# Patient Record
Sex: Male | Born: 1955 | Race: White | Hispanic: No | Marital: Married | Smoking: Never smoker
Health system: Southern US, Community
[De-identification: ages and names within clinical notes are randomized; demographics above are authoritative.]

## PROBLEM LIST (undated history)

## (undated) DIAGNOSIS — E7800 Pure hypercholesterolemia, unspecified: Secondary | ICD-10-CM

## (undated) DIAGNOSIS — E079 Disorder of thyroid, unspecified: Secondary | ICD-10-CM

## (undated) DIAGNOSIS — C801 Malignant (primary) neoplasm, unspecified: Secondary | ICD-10-CM

## (undated) DIAGNOSIS — I2510 Atherosclerotic heart disease of native coronary artery without angina pectoris: Secondary | ICD-10-CM

## (undated) DIAGNOSIS — Z87442 Personal history of urinary calculi: Secondary | ICD-10-CM

## (undated) DIAGNOSIS — G4730 Sleep apnea, unspecified: Secondary | ICD-10-CM

## (undated) DIAGNOSIS — E039 Hypothyroidism, unspecified: Secondary | ICD-10-CM

## (undated) DIAGNOSIS — N400 Enlarged prostate without lower urinary tract symptoms: Secondary | ICD-10-CM

## (undated) DIAGNOSIS — N289 Disorder of kidney and ureter, unspecified: Secondary | ICD-10-CM

## (undated) DIAGNOSIS — M1990 Unspecified osteoarthritis, unspecified site: Secondary | ICD-10-CM

## (undated) HISTORY — PX: VASECTOMY: SHX75

## (undated) HISTORY — PX: HERNIA REPAIR: SHX51

## (undated) HISTORY — PX: SHOULDER SURGERY: SHX246

## (undated) HISTORY — PX: NECK SURGERY: SHX720

## (undated) HISTORY — PX: ANKLE SURGERY: SHX546

## (undated) HISTORY — PX: KNEE SURGERY: SHX244

---

## 2007-05-24 ENCOUNTER — Emergency Department (HOSPITAL_COMMUNITY): Admission: EM | Admit: 2007-05-24 | Discharge: 2007-05-24 | Payer: Self-pay | Admitting: Emergency Medicine

## 2007-12-16 ENCOUNTER — Emergency Department (HOSPITAL_COMMUNITY): Admission: EM | Admit: 2007-12-16 | Discharge: 2007-12-16 | Payer: Self-pay | Admitting: Emergency Medicine

## 2008-08-16 ENCOUNTER — Emergency Department (HOSPITAL_COMMUNITY): Admission: EM | Admit: 2008-08-16 | Discharge: 2008-08-16 | Payer: Self-pay | Admitting: Internal Medicine

## 2008-08-16 ENCOUNTER — Emergency Department (HOSPITAL_COMMUNITY): Admission: EM | Admit: 2008-08-16 | Discharge: 2008-08-16 | Payer: Self-pay | Admitting: Emergency Medicine

## 2010-07-23 LAB — CBC
HCT: 45.4 % (ref 39.0–52.0)
Hemoglobin: 15.9 g/dL (ref 13.0–17.0)
MCHC: 35 g/dL (ref 30.0–36.0)
MCV: 90.3 fL (ref 78.0–100.0)
RBC: 5.02 MIL/uL (ref 4.22–5.81)
RDW: 13.1 % (ref 11.5–15.5)

## 2010-07-23 LAB — BASIC METABOLIC PANEL
CO2: 22 mEq/L (ref 19–32)
Calcium: 9 mg/dL (ref 8.4–10.5)
Chloride: 106 mEq/L (ref 96–112)
GFR calc Af Amer: 52 mL/min — ABNORMAL LOW (ref >60)
Glucose, Bld: 136 mg/dL — ABNORMAL HIGH (ref 70–99)
Potassium: 3.7 mEq/L (ref 3.5–5.1)
Sodium: 136 mEq/L (ref 135–145)

## 2010-07-23 LAB — URINALYSIS, ROUTINE W REFLEX MICROSCOPIC
Specific Gravity, Urine: >1.03 — ABNORMAL HIGH (ref 1.005–1.030)
Specific Gravity, Urine: >1.03 — ABNORMAL HIGH (ref 1.005–1.030)
Urobilinogen, UA: 0.2 mg/dL (ref 0.0–1.0)
Urobilinogen, UA: 0.2 mg/dL (ref 0.0–1.0)

## 2010-07-23 LAB — DIFFERENTIAL
Basophils Relative: 0 % (ref 0–1)
Eosinophils Relative: 0 % (ref 0–5)
Monocytes Absolute: 0.7 10*3/uL (ref 0.1–1.0)
Monocytes Relative: 5 % (ref 3–12)
Neutro Abs: 12.7 10*3/uL — ABNORMAL HIGH (ref 1.7–7.7)

## 2010-07-23 LAB — URINE MICROSCOPIC-ADD ON

## 2010-07-23 LAB — PSA: PSA: 1.21 ng/mL (ref 0.10–4.00)

## 2011-01-15 LAB — DIFFERENTIAL
Lymphocytes Relative: 14
Lymphs Abs: 1.6
Neutro Abs: 8.8 — ABNORMAL HIGH
Neutrophils Relative %: 79 — ABNORMAL HIGH

## 2011-01-15 LAB — URINALYSIS, ROUTINE W REFLEX MICROSCOPIC
Ketones, ur: NEGATIVE
Nitrite: NEGATIVE
Protein, ur: NEGATIVE
pH: 6

## 2011-01-15 LAB — CBC
Platelets: 300
WBC: 11.1 — ABNORMAL HIGH

## 2011-01-15 LAB — BASIC METABOLIC PANEL
BUN: 13
Creatinine, Ser: 1.15
GFR calc non Af Amer: >60

## 2011-01-15 LAB — POCT CARDIAC MARKERS
Myoglobin, poc: 129
Troponin i, poc: <0.05

## 2011-01-15 LAB — SEDIMENTATION RATE: Sed Rate: 5

## 2015-11-03 ENCOUNTER — Emergency Department (HOSPITAL_COMMUNITY)
Admission: EM | Admit: 2015-11-03 | Discharge: 2015-11-03 | Disposition: A | Discharge status: Home / Self Care | Attending: Emergency Medicine | Admitting: Emergency Medicine

## 2015-11-03 ENCOUNTER — Encounter (HOSPITAL_COMMUNITY): Payer: Self-pay

## 2015-11-03 DIAGNOSIS — Z7982 Long term (current) use of aspirin: Secondary | ICD-10-CM | POA: Diagnosis not present

## 2015-11-03 DIAGNOSIS — N200 Calculus of kidney: Secondary | ICD-10-CM

## 2015-11-03 DIAGNOSIS — Z79899 Other long term (current) drug therapy: Secondary | ICD-10-CM | POA: Insufficient documentation

## 2015-11-03 DIAGNOSIS — R319 Hematuria, unspecified: Secondary | ICD-10-CM

## 2015-11-03 DIAGNOSIS — R109 Unspecified abdominal pain: Secondary | ICD-10-CM

## 2015-11-03 HISTORY — DX: Benign prostatic hyperplasia without lower urinary tract symptoms: N40.0

## 2015-11-03 HISTORY — DX: Disorder of thyroid, unspecified: E07.9

## 2015-11-03 HISTORY — DX: Disorder of kidney and ureter, unspecified: N28.9

## 2015-11-03 HISTORY — DX: Pure hypercholesterolemia, unspecified: E78.00

## 2015-11-03 LAB — CBC WITH DIFFERENTIAL/PLATELET
BASOS PCT: 1 %
Basophils Absolute: 0.1 10*3/uL (ref 0.0–0.1)
EOS ABS: 0.3 10*3/uL (ref 0.0–0.7)
EOS PCT: 3 %
HCT: 48.6 % (ref 39.0–52.0)
Hemoglobin: 16.9 g/dL (ref 13.0–17.0)
LYMPHS ABS: 1.5 10*3/uL (ref 0.7–4.0)
Lymphocytes Relative: 16 %
MCH: 31.2 pg (ref 26.0–34.0)
MCHC: 34.8 g/dL (ref 30.0–36.0)
MCV: 89.7 fL (ref 78.0–100.0)
Monocytes Absolute: 0.5 10*3/uL (ref 0.1–1.0)
Monocytes Relative: 5 %
Neutro Abs: 7.5 10*3/uL (ref 1.7–7.7)
Neutrophils Relative %: 75 %
PLATELETS: 309 10*3/uL (ref 150–400)
RBC: 5.42 MIL/uL (ref 4.22–5.81)
RDW: 12.7 % (ref 11.5–15.5)
WBC: 9.9 10*3/uL (ref 4.0–10.5)

## 2015-11-03 LAB — URINE MICROSCOPIC-ADD ON
SQUAMOUS EPITHELIAL / LPF: NONE SEEN
WBC, UA: NONE SEEN WBC/hpf (ref 0–5)

## 2015-11-03 LAB — URINALYSIS, ROUTINE W REFLEX MICROSCOPIC
BILIRUBIN URINE: NEGATIVE
Glucose, UA: NEGATIVE mg/dL
KETONES UR: NEGATIVE mg/dL
Leukocytes, UA: NEGATIVE
Nitrite: NEGATIVE
PROTEIN: NEGATIVE mg/dL
Specific Gravity, Urine: 1.01 (ref 1.005–1.030)
pH: 7.5 (ref 5.0–8.0)

## 2015-11-03 LAB — BASIC METABOLIC PANEL
Anion gap: 9 (ref 5–15)
BUN: 14 mg/dL (ref 6–20)
CO2: 27 mmol/L (ref 22–32)
CREATININE: 1.26 mg/dL — AB (ref 0.61–1.24)
Calcium: 9.7 mg/dL (ref 8.9–10.3)
Chloride: 102 mmol/L (ref 101–111)
Glucose, Bld: 112 mg/dL — ABNORMAL HIGH (ref 65–99)
POTASSIUM: 4.4 mmol/L (ref 3.5–5.1)
SODIUM: 138 mmol/L (ref 135–145)

## 2015-11-03 MED ORDER — ONDANSETRON HCL 4 MG PO TABS: 4.0000 mg | ORAL_TABLET | Freq: Three times a day (TID) | ORAL | Status: DC | PRN

## 2015-11-03 MED ORDER — HYDROCODONE-ACETAMINOPHEN 5-325 MG PO TABS: 1.0000 | ORAL_TABLET | ORAL | Status: DC | PRN

## 2015-11-03 MED ORDER — SODIUM CHLORIDE 0.9 % IV BOLUS (SEPSIS)
1000.0000 mL | Freq: Once | INTRAVENOUS | Status: AC
  Administered 2015-11-03: 1000 mL via INTRAVENOUS

## 2015-11-03 MED ORDER — ONDANSETRON HCL 4 MG/2ML IJ SOLN
4.0000 mg | Freq: Once | INTRAMUSCULAR | Status: AC
  Administered 2015-11-03: 4 mg via INTRAVENOUS
  Filled 2015-11-03: qty 2

## 2015-11-03 MED ORDER — MORPHINE SULFATE (PF) 4 MG/ML IV SOLN
4.0000 mg | Freq: Once | INTRAVENOUS | Status: AC
  Administered 2015-11-03: 4 mg via INTRAVENOUS
  Filled 2015-11-03: qty 1

## 2015-11-03 NOTE — ED Provider Notes (Signed)
CSN: [id number]     Arrival date & time 11/03/15  1024 History  By signing my name below, I, Higinio Plan, attest that this documentation has been prepared under the direction and in the presence of Forde Dandy, MD . Electronically Signed: Higinio Plan, Scribe. 11/03/2015. 11:46 AM.   Chief Complaint  Patient presents with  . Flank Pain  . Hematuria   The history is provided by the patient. No language interpreter was used.   HPI Comments: Adrian Marshall is a 60 y.o. male with PMHx of kidney stones, who presents to the Emergency Department complaining of gradually worsening, intermittent, hematuria and left flank pain that began at ~8pm last night. He notes his pain is worsened when sitting but is improved while being active and walking around. Pt reports a hx of 3 kidney stones in the past 7 years. He states his past 2 kidney stones were 67mm and took 5 months to pass. He states every experience with his kidney stones has been different; but states he had similar symptoms with prior stone. Pt states he has used the strainer that he received when passing his first stone. Pt denies dysuria, vomiting, diarrhea, and fever. He notes he was tested for bladder cancer with cystoscopy 14 months ago which was negative. He states he is currently followed by a nephrologist and urologist at the [facility]. He reports tramadol doesn't relieve his pain; however, morphine does.   Past Medical History  Diagnosis Date  . Renal disorder     kidney stones  . BPH (benign prostatic hyperplasia)   . Thyroid disease   . Hypercholesterolemia    Past Surgical History  Procedure Laterality Date  . Neck surgery    . Hernia repair    . Shoulder surgery    . Knee surgery    . Ankle surgery     History reviewed. No pertinent family history. Social History  Substance Use Topics  . Smoking status: Never Smoker   . Smokeless tobacco: None  . Alcohol Use: No    Review of Systems 10/14 systems reviewed and all  are negative for acute change except as noted in the HPI.  Allergies  Neurontin and Other  Home Medications   Prior to Admission medications   Not on File   BP 153/90 mmHg  Pulse 101  Temp(Src) 97.3 F (36.3 C) (Oral)  Resp 20  Ht 5\' 10"  (1.778 m)  Wt 200 lb (90.719 kg)  BMI 28.70 kg/m2  SpO2 96% Physical Exam Physical Exam  Nursing note and vitals reviewed. Constitutional: Well developed, well nourished, non-toxic, and in no acute distress Head: Normocephalic and atraumatic.  Mouth/Throat: Oropharynx is clear and moist.  Neck: Normal range of motion. Neck supple.  Cardiovascular: Normal rate and regular rhythm.   Pulmonary/Chest: Effort normal and breath sounds normal.  Abdominal: Soft. There is no tenderness. There is no rebound and no guarding. minimal CVA tenderness on his left side  Musculoskeletal: Normal range of motion.  Neurological: Alert, no facial droop, fluent speech, moves all extremities symmetrically Skin: Skin is warm and dry.  Psychiatric: Cooperative  ED Course  Procedures  DIAGNOSTIC STUDIES:  Oxygen Saturation is 96% on RA, normal by my interpretation.    COORDINATION OF CARE:  11:42 AM Discussed treatment plan, which includes IV fluids, urinalysis and morphine with pt at bedside and pt agreed to plan.  Labs Review Labs Reviewed  CBC WITH DIFFERENTIAL/PLATELET  URINALYSIS, ROUTINE W REFLEX MICROSCOPIC (NOT AT [facility])  BASIC METABOLIC PANEL    Imaging Review No results found. I have personally reviewed and evaluated these images and lab results as part of my medical decision-making.   EKG Interpretation None      EMERGENCY DEPARTMENT US RENAL EXAM  "Study: Limited Retroperitoneal Ultrasound of Kidneys"  INDICATIONS: Flank pain  Long and short axis of both kidneys were obtained.   PERFORMED BY: Myself  IMAGES ARCHIVED?: Yes  LIMITATIONS: None  VIEWS USED: Long axis and Short axis   INTERPRETATION: Left  Hydronephrosis mild to  none   CPT Code: [id number] (limited retroperitoneal)    MDM   Final diagnoses:  None    60 year old male who presents with left flank pain and hematuria for 1 day. History of nephrolithiasis, and this seems similar. He is afebrile and hemodynamically stable. with some left flank tenderness but abdomen is otherwise benign. He has stable renal function and urine is without infection but with blood. Bedside renal US with ? Of mild hydronephrosis. Given that the symptoms are similar to prior episodes of kidney stone patient will continue supportive care management for this at home. As I'm not concerned about other intra-abdominal process or retroperitoneal process such as aneurysm or infection, I do not think CT would change management. I have discussed strict return and follow-up instructions. Discharged with NOrco and antiemetics. He will f/u with nephrologist and urologist as outpatient.   I personally performed the services described in this documentation, which was scribed in my presence. The recorded information has been reviewed and is accurate.    Forde Dandy, MD 11/03/15 1259

## 2015-11-03 NOTE — ED Notes (Signed)
PT reports notice hematuria off and on since 8pm last night and left flank pain.  Reports history of kidney stones.

## 2015-11-03 NOTE — Discharge Instructions (Signed)
Continue to follow-up with your urologist and nephrologist for ongoing management. Return for worsening symptoms, including worsening pain, intractable vomiting, fever, passing out, or any other symptoms concerning to you.    Kidney Stones Kidney stones (urolithiasis) are deposits that form inside your kidneys. The intense pain is caused by the stone moving through the urinary tract. When the stone moves, the ureter goes into spasm around the stone. The stone is usually passed in the urine.  CAUSES   A disorder that makes certain neck glands produce too much parathyroid hormone (primary hyperparathyroidism).  A buildup of uric acid crystals, similar to gout in your joints.  Narrowing (stricture) of the ureter.  A kidney obstruction present at birth (congenital obstruction).  Previous surgery on the kidney or ureters.  Numerous kidney infections. SYMPTOMS   Feeling sick to your stomach (nauseous).  Throwing up (vomiting).  Blood in the urine (hematuria).  Pain that usually spreads (radiates) to the groin.  Frequency or urgency of urination. DIAGNOSIS   Taking a history and physical exam.  Blood or urine tests.  CT scan.  Occasionally, an examination of the inside of the urinary bladder (cystoscopy) is performed. TREATMENT   Observation.  Increasing your fluid intake.  Extracorporeal shock wave lithotripsy--This is a noninvasive procedure that uses shock waves to break up kidney stones.  Surgery may be needed if you have severe pain or persistent obstruction. There are various surgical procedures. Most of the procedures are performed with the use of small instruments. Only small incisions are needed to accommodate these instruments, so recovery time is minimized. The size, location, and chemical composition are all important variables that will determine the proper choice of action for you. Talk to your health care provider to better understand your situation so that you  will minimize the risk of injury to yourself and your kidney.  HOME CARE INSTRUCTIONS   Drink enough water and fluids to keep your urine clear or pale yellow. This will help you to pass the stone or stone fragments.  Strain all urine through the provided strainer. Keep all particulate matter and stones for your health care provider to see. The stone causing the pain may be as small as a grain of salt. It is very important to use the strainer each and every time you pass your urine. The collection of your stone will allow your health care provider to analyze it and verify that a stone has actually passed. The stone analysis will often identify what you can do to reduce the incidence of recurrences.  Only take over-the-counter or prescription medicines for pain, discomfort, or fever as directed by your health care provider.  Keep all follow-up visits as told by your health care provider. This is important.  Get follow-up X-rays if required. The absence of pain does not always mean that the stone has passed. It may have only stopped moving. If the urine remains completely obstructed, it can cause loss of kidney function or even complete destruction of the kidney. It is your responsibility to make sure X-rays and follow-ups are completed. Ultrasounds of the kidney can show blockages and the status of the kidney. Ultrasounds are not associated with any radiation and can be performed easily in a matter of minutes.  Make changes to your daily diet as told by your health care provider. You may be told to:  Limit the amount of salt that you eat.  Eat 5 or more servings of fruits and vegetables each day.  Limit  the amount of meat, poultry, fish, and eggs that you eat.  Collect a 24-hour urine sample as told by your health care provider.You may need to collect another urine sample every 6-12 months. SEEK MEDICAL CARE IF:  You experience pain that is progressive and unresponsive to any pain medicine you  have been prescribed. SEEK IMMEDIATE MEDICAL CARE IF:   Pain cannot be controlled with the prescribed medicine.  You have a fever or shaking chills.  The severity or intensity of pain increases over 18 hours and is not relieved by pain medicine.  You develop a new onset of abdominal pain.  You feel faint or pass out.  You are unable to urinate.   This information is not intended to replace advice given to you by your health care provider. Make sure you discuss any questions you have with your health care provider.   Document Released: 03/31/2005 Document Revised: 12/20/2014 Document Reviewed: 09/01/2012 Elsevier Interactive Patient Education [employer].

## 2017-06-16 ENCOUNTER — Ambulatory Visit (INDEPENDENT_AMBULATORY_CARE_PROVIDER_SITE_OTHER): Admitting: Urology

## 2017-06-16 ENCOUNTER — Encounter: Payer: Self-pay | Admitting: Urology

## 2017-06-16 VITALS — BP 138/79 | HR 105 | Ht 70.0 in | Wt 200.5 lb

## 2017-06-16 DIAGNOSIS — N200 Calculus of kidney: Secondary | ICD-10-CM

## 2017-06-16 DIAGNOSIS — N401 Enlarged prostate with lower urinary tract symptoms: Secondary | ICD-10-CM | POA: Diagnosis not present

## 2017-06-16 DIAGNOSIS — R3914 Feeling of incomplete bladder emptying: Secondary | ICD-10-CM

## 2017-06-16 LAB — MICROSCOPIC EXAMINATION
BACTERIA UA: NONE SEEN
Epithelial Cells (non renal): NONE SEEN /hpf (ref 0–10)
RBC MICROSCOPIC, UA: NONE SEEN /HPF (ref 0–?)

## 2017-06-16 LAB — URINALYSIS, COMPLETE
Bilirubin, UA: NEGATIVE
GLUCOSE, UA: NEGATIVE
KETONES UA: NEGATIVE
LEUKOCYTES UA: NEGATIVE
NITRITE UA: NEGATIVE
PROTEIN UA: NEGATIVE
RBC, UA: NEGATIVE
Urobilinogen, Ur: 1 mg/dL (ref 0.2–1.0)
pH, UA: 5.5 (ref 5.0–7.5)

## 2017-06-17 ENCOUNTER — Encounter: Payer: Self-pay | Admitting: Urology

## 2017-06-17 DIAGNOSIS — R3914 Feeling of incomplete bladder emptying: Principal | ICD-10-CM

## 2017-06-17 DIAGNOSIS — N200 Calculus of kidney: Secondary | ICD-10-CM | POA: Insufficient documentation

## 2017-06-17 DIAGNOSIS — N401 Enlarged prostate with lower urinary tract symptoms: Secondary | ICD-10-CM | POA: Insufficient documentation

## 2017-06-17 NOTE — Progress Notes (Signed)
06/16/2017 7:03 AM   Adrian Marshall 15-Oct-1955 [id number]  Referring provider: Celedonio Savage, MD [place], [place] [address]  Chief Complaint  Patient presents with  . Benign Prostatic Hypertrophy    HPI: Adrian Marshall is a 62 year old male self-referred for a recent bump in his PSA.  He has a long history of BPH and has been followed by the [place] since 2005.  His PSA in early 2000 was in the 1 range.  It has increased slightly over the years but not abnormally.  PSA in 2015 was in the upper 1 range.  Since July 2016 his PSA has been in the 2 range.  It was 2.5 in January 2018 and his most recent PSA February 2019 had bumped slightly to 3.46.  He was concerned about this increase.  He does have severe lower urinary tract symptoms consisting of sensation of incomplete emptying, frequency, intermittency, urgency, weak urinary stream and straining to urinate.  He has nocturia x2-3.  IPSS was 25/26 with a qol rated 3/6.  He denies dysuria or gross hematuria.  He denies flank, abdominal, pelvic or scrotal pain.  He is presently on tamsulosin 0.8 mg daily.  He has tried finasteride in the past however could not tolerate secondary to side effects.  He is also followed for a 9 mm left lower pole calculus.  His prostate volume has been estimated at 46 cc on transabdominal ultrasound.   PMH: Past Medical History:  Diagnosis Date  . BPH (benign prostatic hyperplasia)   . Hypercholesterolemia   . Renal disorder    kidney stones  . Thyroid disease     Surgical History: Past Surgical History:  Procedure Laterality Date  . ANKLE SURGERY    . HERNIA REPAIR    . KNEE SURGERY    . NECK SURGERY    . SHOULDER SURGERY      Home Medications:  Allergies as of 06/16/2017      Reactions   Cortisone    Other reaction(s): Other (See Comments) Other Reaction: inj to shoulder - paralysis   Neurontin [gabapentin]    Other    steroids      Medication List        Accurate as  of 06/16/17 11:59 PM. Always use your most recent med list.          aspirin EC 81 MG tablet Take 81 mg by mouth daily.   atorvastatin 80 MG tablet Commonly known as:  LIPITOR Take 80 mg by mouth daily.   levothyroxine 200 MCG tablet Commonly known as:  SYNTHROID, LEVOTHROID Take 200 mcg by mouth daily before breakfast.   tamsulosin 0.4 MG Caps capsule Commonly known as:  FLOMAX Take 0.8 mg by mouth daily.       Allergies:  Allergies  Allergen Reactions  . Cortisone     Other reaction(s): Other (See Comments) Other Reaction: inj to shoulder - paralysis  . Neurontin [Gabapentin]   . Other     steroids    Family History: Family History  Problem Relation Age of Onset  . Prostate cancer Neg Hx   . Bladder Cancer Neg Hx   . Kidney cancer Neg Hx     Social History:  reports that  has never smoked. he has never used smokeless tobacco. He reports that he does not drink alcohol or use drugs.  ROS: UROLOGY Frequent Urination?: Yes Hard to postpone urination?: Yes Burning/pain with urination?: No Get up at night to  urinate?: Yes Leakage of urine?: No Urine stream starts and stops?: Yes Trouble starting stream?: Yes Do you have to strain to urinate?: No Blood in urine?: No Urinary tract infection?: No Sexually transmitted disease?: No Injury to kidneys or bladder?: No Painful intercourse?: No Weak stream?: Yes Erection problems?: No Penile pain?: No  Gastrointestinal Nausea?: No Vomiting?: No Indigestion/heartburn?: No Diarrhea?: No Constipation?: No  Constitutional Fever: No Night sweats?: No Weight loss?: No Fatigue?: No  Skin Skin rash/lesions?: No Itching?: No  Eyes Blurred vision?: No Double vision?: No  Ears/Nose/Throat Sore throat?: No Sinus problems?: No  Hematologic/Lymphatic Swollen glands?: No Easy bruising?: No  Cardiovascular Leg swelling?: No Chest pain?: No  Respiratory Cough?: No Shortness of breath?:  No  Endocrine Excessive thirst?: No  Musculoskeletal Back pain?: No Joint pain?: Yes  Neurological Headaches?: No Dizziness?: No  Psychologic Depression?: No Anxiety?: No  Physical Exam: BP 138/79 (BP Location: Right Arm, Patient Position: Sitting, Cuff Size: Normal)   Pulse (!) 105   Ht 5\' 10"  (1.778 m)   Wt 200 lb 8 oz (90.9 kg)   BMI 28.77 kg/m   Constitutional:  Alert and oriented, No acute distress. HEENT: [place] AT, moist mucus membranes.  Trachea midline, no masses. Cardiovascular: No clubbing, cyanosis, or edema. Respiratory: Normal respiratory effort, no increased work of breathing. GI: Abdomen is soft, nontender, nondistended, no abdominal masses GU: No CVA tenderness.  Prostate 50 g, smooth without nodules. Lymph: No cervical or inguinal lymphadenopathy. Skin: No rashes, bruises or suspicious lesions. Neurologic: Grossly intact, no focal deficits, moving all 4 extremities. Psychiatric: Normal mood and affect.  Laboratory Data: Lab Results  Component Value Date   WBC 9.9 11/03/2015   HGB 16.9 11/03/2015   HCT 48.6 11/03/2015   MCV 89.7 11/03/2015   PLT 309 11/03/2015    Lab Results  Component Value Date   CREATININE 1.26 (H) 11/03/2015    Urinalysis Dipstick/microscopy negative  Pertinent Imaging: N/A  Assessment & Plan:    1. Benign prostatic hyperplasia with incomplete bladder emptying PVR by bladder scan today was 195 mL.  His most recent PSA has bumped slightly greater than 0.75 however he was informed that PSA velocity is determined on 3 successive readings.  Would recommend a repeat PSA in approximately 3-4 months to make sure he is not on an upward trend.  His DRE is benign.  He does have incomplete bladder emptying and severe lower urinary tract symptoms.  He is on tamsulosin 0.8 mg and was unable to tolerate 5-ARI medication.  I discussed surgical options including TURP, PVP and minimally invasive treatment options.  He was interested in  pursuing and will schedule cystoscopy.  - Urinalysis, Complete - Bladder Scan (Post Void Residual) in office  2. Nephrolithiasis Asymptomatic, nonobstructing left lower pole calculus followed regularly at the [place].    Abbie Sons, [place] [address], [place], [place] [address] [phone]

## 2017-06-29 ENCOUNTER — Other Ambulatory Visit

## 2017-06-29 DIAGNOSIS — R3914 Feeling of incomplete bladder emptying: Principal | ICD-10-CM

## 2017-06-29 DIAGNOSIS — N401 Enlarged prostate with lower urinary tract symptoms: Secondary | ICD-10-CM

## 2017-06-30 LAB — PSA: Prostate Specific Ag, Serum: 3 ng/mL (ref 0.0–4.0)

## 2017-07-01 ENCOUNTER — Encounter: Payer: Self-pay | Admitting: Urology

## 2017-07-01 ENCOUNTER — Other Ambulatory Visit: Payer: Self-pay | Admitting: Radiology

## 2017-07-01 ENCOUNTER — Telehealth: Payer: Self-pay

## 2017-07-01 ENCOUNTER — Ambulatory Visit (INDEPENDENT_AMBULATORY_CARE_PROVIDER_SITE_OTHER): Admitting: Urology

## 2017-07-01 ENCOUNTER — Other Ambulatory Visit: Payer: Self-pay

## 2017-07-01 VITALS — BP 149/73 | HR 108 | Ht 70.0 in | Wt 195.0 lb

## 2017-07-01 DIAGNOSIS — N401 Enlarged prostate with lower urinary tract symptoms: Secondary | ICD-10-CM

## 2017-07-01 DIAGNOSIS — R3914 Feeling of incomplete bladder emptying: Secondary | ICD-10-CM

## 2017-07-01 DIAGNOSIS — N200 Calculus of kidney: Secondary | ICD-10-CM

## 2017-07-01 LAB — URINALYSIS, COMPLETE
Bilirubin, UA: NEGATIVE
GLUCOSE, UA: NEGATIVE
KETONES UA: NEGATIVE
LEUKOCYTES UA: NEGATIVE
Nitrite, UA: NEGATIVE
Urobilinogen, Ur: 0.2 mg/dL (ref 0.2–1.0)
pH, UA: 5.5 (ref 5.0–7.5)

## 2017-07-01 LAB — MICROSCOPIC EXAMINATION
EPITHELIAL CELLS (NON RENAL): NONE SEEN /HPF (ref 0–10)
WBC, UA: NONE SEEN /hpf (ref 0–?)

## 2017-07-01 MED ORDER — LIDOCAINE HCL 2 % EX GEL
1.0000 "application " | Freq: Once | CUTANEOUS | Status: AC
  Administered 2017-07-01: 1 via URETHRAL

## 2017-07-01 MED ORDER — CIPROFLOXACIN HCL 500 MG PO TABS
500.0000 mg | ORAL_TABLET | Freq: Once | ORAL | Status: AC
  Administered 2017-07-01: 500 mg via ORAL

## 2017-07-01 NOTE — Progress Notes (Signed)
   07/01/17  CC:  Chief Complaint  Patient presents with  . Cysto    HPI: Refer to my previous note dated 06/16/2017.  Repeat PSA was 3.0  Blood pressure (!) 149/73, pulse (!) 108, height 5\' 10"  (1.778 m), weight 195 lb (88.5 kg). NED. A&Ox3.   No respiratory distress   Abd soft, NT, ND Normal phallus with bilateral descended testicles  Cystoscopy Procedure Note  Patient identification was confirmed, informed consent was obtained, and patient was prepped using Betadine solution.  Lidocaine jelly was administered per urethral meatus.    Preoperative abx where received prior to procedure.     Pre-Procedure: - Inspection reveals a normal caliber ureteral meatus.  Procedure: The flexible cystoscope was introduced without difficulty - No urethral strictures/lesions are present. - Coapting lateral lobes with a 4 cm prostatic urethral length.  No significant bladder neck elevation or median lobe  - Normal bladder neck - Bilateral ureteral orifices identified - Bladder mucosa  reveals no ulcers, tumors, or lesions - No bladder stones -Mild trabeculation  Retroflexion shows no intravesical median lobe   Post-Procedure: - Patient tolerated the procedure well  Assessment/ Plan: 62 year old male with bothersome lower urinary tract symptoms on combined medical therapy.  He is not satisfied with his voiding pattern.  PVR last visit was 195 mL.  We discussed surgical options including minimally invasive options of your left and water vapor ablation.  TURP and PVP were also discussed.  He would like to proceed with TURP.

## 2017-07-01 NOTE — H&P (View-Only) (Signed)
07/01/2017 4:45 PM   Adrian Marshall [facility] [birth year] [id number]  Referring provider: No referring provider defined for this encounter.   HPI: 62 year old male with a long history of BPH and lower urinary tract symptoms.  PVR was 195 mL.  He is on  medical therapy with tamsulosin 0.8 mg daily.  He has been on finasteride in the past however could not tolerate secondary to side effects.  Cystoscopy performed today remarkable for lateral lobe enlargement.  He has elected to proceed with TURP.  PSA was stable at 3.0.   PMH: Past Medical History:  Diagnosis Date  . BPH (benign prostatic hyperplasia)   . Hypercholesterolemia   . Renal disorder    kidney stones  . Thyroid disease     Surgical History: Past Surgical History:  Procedure Laterality Date  . ANKLE SURGERY    . HERNIA REPAIR    . KNEE SURGERY    . NECK SURGERY    . SHOULDER SURGERY    . VASECTOMY      Home Medications:  Allergies as of 07/01/2017      Reactions   Cortisone    Other reaction(s): Other (See Comments) Other Reaction: inj to shoulder - paralysis   Neurontin [gabapentin]    Other    steroids      Medication List        Accurate as of 07/01/17  4:45 PM. Always use your most recent med list.          aspirin EC 81 MG tablet Take 81 mg by mouth daily.   atorvastatin 80 MG tablet Commonly known as:  LIPITOR Take 80 mg by mouth daily.   levothyroxine 200 MCG tablet Commonly known as:  SYNTHROID, LEVOTHROID Take 200 mcg by mouth daily before breakfast.   tamsulosin 0.4 MG Caps capsule Commonly known as:  FLOMAX Take 0.8 mg by mouth daily.       Allergies:  Allergies  Allergen Reactions  . Cortisone     Other reaction(s): Other (See Comments) Other Reaction: inj to shoulder - paralysis  . Neurontin [Gabapentin]   . Other     steroids    Family History: Family History  Problem Relation Age of Onset  . Prostate cancer Neg Hx   . Bladder Cancer Neg Hx   . Kidney cancer Neg  Hx     Social History:  reports that  has never smoked. he has never used smokeless tobacco. He reports that he does not drink alcohol or use drugs.  ROS: UROLOGY Frequent Urination?: Yes Hard to postpone urination?: Yes Burning/pain with urination?: No Get up at night to urinate?: Yes Leakage of urine?: No Urine stream starts and stops?: Yes Trouble starting stream?: Yes Do you have to strain to urinate?: No Blood in urine?: No Urinary tract infection?: No Sexually transmitted disease?: No Injury to kidneys or bladder?: No Painful intercourse?: No Weak stream?: Yes Erection problems?: No Penile pain?: No  Gastrointestinal Nausea?: No Vomiting?: No Indigestion/heartburn?: No Diarrhea?: No Constipation?: No  Constitutional Fever: No Night sweats?: No Weight loss?: No Fatigue?: No  Skin Skin rash/lesions?: No Itching?: No  Eyes Blurred vision?: No Double vision?: No  Ears/Nose/Throat Sore throat?: No Sinus problems?: No  Hematologic/Lymphatic Swollen glands?: No Easy bruising?: No  Cardiovascular Leg swelling?: No Chest pain?: No  Respiratory Cough?: No Shortness of breath?: No  Endocrine Excessive thirst?: No  Musculoskeletal Back pain?: No Joint pain?: Yes  Neurological Headaches?: No Dizziness?: No  Psychologic Depression?: No  Anxiety?: No   Physical Exam: BP (!) 149/73 (BP Location: Left Arm, Patient Position: Sitting, Cuff Size: Normal)   Pulse (!) 108   Ht 5\' 10"  (1.778 m)   Wt 195 lb (88.5 kg)   BMI 27.98 kg/m   Constitutional:  Alert and oriented, No acute distress. HEENT: [place] AT, moist mucus membranes.  Trachea midline, no masses. Cardiovascular: No clubbing, cyanosis, or edema. Respiratory: Normal respiratory effort, no increased work of breathing. GI: Abdomen is soft, nontender, nondistended, no abdominal masses GU: No CVA tenderness Lymph: No cervical or inguinal lymphadenopathy. Skin: No rashes, bruises  or suspicious lesions. Neurologic: Grossly intact, no focal deficits, moving all 4 extremities. Psychiatric: Normal mood and affect.  Laboratory Data: Lab Results  Component Value Date   WBC 9.9 11/03/2015   HGB 16.9 11/03/2015   HCT 48.6 11/03/2015   MCV 89.7 11/03/2015   PLT 309 11/03/2015    Lab Results  Component Value Date   CREATININE 1.26 (H) 11/03/2015    Assessment & Plan:   62 year old male with BPH and incomplete bladder emptying.  He has elected to proceed with TURP after discussing management options.  The procedure was discussed in detail including potential risks of bleeding, infection, retrograde ejaculation, urethral stricture, bladder contraction and rarely urinary incontinence/erectile dysfunction.  He was informed that retrograde ejaculation is quite common with this procedure at 70%.  The potential risks of anesthesia were discussed.  He indicated all questions were answered to his satisfaction and desires to proceed.   Abbie Sons, [place] [address], [place], [place] [address] [phone]

## 2017-07-01 NOTE — Telephone Encounter (Signed)
Letter sent.

## 2017-07-01 NOTE — Telephone Encounter (Signed)
-----   Message from Abbie Sons, MD sent at 06/30/2017  9:56 PM EDT ----- Repeat PSA looks much better at 3.0.

## 2017-07-01 NOTE — Progress Notes (Signed)
07/01/2017 4:45 PM   Adrian Marshall [facility] 07-27-1955 [id number]  Referring provider: No referring provider defined for this encounter.   HPI: 62 year old male with a long history of BPH and lower urinary tract symptoms.  PVR was 195 mL.  He is on  medical therapy with tamsulosin 0.8 mg daily.  He has been on finasteride in the past however could not tolerate secondary to side effects.  Cystoscopy performed today remarkable for lateral lobe enlargement.  He has elected to proceed with TURP.  PSA was stable at 3.0.   PMH: Past Medical History:  Diagnosis Date  . BPH (benign prostatic hyperplasia)   . Hypercholesterolemia   . Renal disorder    kidney stones  . Thyroid disease     Surgical History: Past Surgical History:  Procedure Laterality Date  . ANKLE SURGERY    . HERNIA REPAIR    . KNEE SURGERY    . NECK SURGERY    . SHOULDER SURGERY    . VASECTOMY      Home Medications:  Allergies as of 07/01/2017      Reactions   Cortisone    Other reaction(s): Other (See Comments) Other Reaction: inj to shoulder - paralysis   Neurontin [gabapentin]    Other    steroids      Medication List        Accurate as of 07/01/17  4:45 PM. Always use your most recent med list.          aspirin EC 81 MG tablet Take 81 mg by mouth daily.   atorvastatin 80 MG tablet Commonly known as:  LIPITOR Take 80 mg by mouth daily.   levothyroxine 200 MCG tablet Commonly known as:  SYNTHROID, LEVOTHROID Take 200 mcg by mouth daily before breakfast.   tamsulosin 0.4 MG Caps capsule Commonly known as:  FLOMAX Take 0.8 mg by mouth daily.       Allergies:  Allergies  Allergen Reactions  . Cortisone     Other reaction(s): Other (See Comments) Other Reaction: inj to shoulder - paralysis  . Neurontin [Gabapentin]   . Other     steroids    Family History: Family History  Problem Relation Age of Onset  . Prostate cancer Neg Hx   . Bladder Cancer Neg Hx   . Kidney cancer Neg  Hx     Social History:  reports that  has never smoked. he has never used smokeless tobacco. He reports that he does not drink alcohol or use drugs.  ROS: UROLOGY Frequent Urination?: Yes Hard to postpone urination?: Yes Burning/pain with urination?: No Get up at night to urinate?: Yes Leakage of urine?: No Urine stream starts and stops?: Yes Trouble starting stream?: Yes Do you have to strain to urinate?: No Blood in urine?: No Urinary tract infection?: No Sexually transmitted disease?: No Injury to kidneys or bladder?: No Painful intercourse?: No Weak stream?: Yes Erection problems?: No Penile pain?: No  Gastrointestinal Nausea?: No Vomiting?: No Indigestion/heartburn?: No Diarrhea?: No Constipation?: No  Constitutional Fever: No Night sweats?: No Weight loss?: No Fatigue?: No  Skin Skin rash/lesions?: No Itching?: No  Eyes Blurred vision?: No Double vision?: No  Ears/Nose/Throat Sore throat?: No Sinus problems?: No  Hematologic/Lymphatic Swollen glands?: No Easy bruising?: No  Cardiovascular Leg swelling?: No Chest pain?: No  Respiratory Cough?: No Shortness of breath?: No  Endocrine Excessive thirst?: No  Musculoskeletal Back pain?: No Joint pain?: Yes  Neurological Headaches?: No Dizziness?: No  Psychologic Depression?: No  Anxiety?: No   Physical Exam: BP (!) 149/73 (BP Location: Left Arm, Patient Position: Sitting, Cuff Size: Normal)   Pulse (!) 108   Ht 5\' 10"  (1.778 m)   Wt 195 lb (88.5 kg)   BMI 27.98 kg/m   Constitutional:  Alert and oriented, No acute distress. HEENT: [place] AT, moist mucus membranes.  Trachea midline, no masses. Cardiovascular: No clubbing, cyanosis, or edema. Respiratory: Normal respiratory effort, no increased work of breathing. GI: Abdomen is soft, nontender, nondistended, no abdominal masses GU: No CVA tenderness Lymph: No cervical or inguinal lymphadenopathy. Skin: No rashes, bruises  or suspicious lesions. Neurologic: Grossly intact, no focal deficits, moving all 4 extremities. Psychiatric: Normal mood and affect.  Laboratory Data: Lab Results  Component Value Date   WBC 9.9 11/03/2015   HGB 16.9 11/03/2015   HCT 48.6 11/03/2015   MCV 89.7 11/03/2015   PLT 309 11/03/2015    Lab Results  Component Value Date   CREATININE 1.26 (H) 11/03/2015    Assessment & Plan:   62 year old male with BPH and incomplete bladder emptying.  He has elected to proceed with TURP after discussing management options.  The procedure was discussed in detail including potential risks of bleeding, infection, retrograde ejaculation, urethral stricture, bladder contraction and rarely urinary incontinence/erectile dysfunction.  He was informed that retrograde ejaculation is quite common with this procedure at 70%.  The potential risks of anesthesia were discussed.  He indicated all questions were answered to his satisfaction and desires to proceed.   Abbie Sons, [place] [address] [place], [place] [address] [phone]

## 2017-07-02 ENCOUNTER — Encounter: Payer: Self-pay | Admitting: Urology

## 2017-07-04 LAB — CULTURE, URINE COMPREHENSIVE

## 2017-07-15 ENCOUNTER — Encounter
Admission: RE | Admit: 2017-07-15 | Discharge: 2017-07-15 | Disposition: A | Discharge status: Home / Self Care | Source: Ambulatory Visit | Attending: Urology | Admitting: Urology

## 2017-07-15 ENCOUNTER — Other Ambulatory Visit: Payer: Self-pay

## 2017-07-15 HISTORY — DX: Malignant (primary) neoplasm, unspecified: C80.1

## 2017-07-15 HISTORY — DX: Sleep apnea, unspecified: G47.30

## 2017-07-15 HISTORY — DX: Hypothyroidism, unspecified: E03.9

## 2017-07-15 HISTORY — DX: Unspecified osteoarthritis, unspecified site: M19.90

## 2017-07-15 HISTORY — DX: Personal history of urinary calculi: Z87.442

## 2017-07-15 HISTORY — DX: Atherosclerotic heart disease of native coronary artery without angina pectoris: I25.10

## 2017-07-15 NOTE — Patient Instructions (Addendum)
Your procedure is scheduled on: 07-20-17 MONDAY Report to Same Day Surgery 2nd floor medical mall [facility] Entrance-take elevator on left to 2nd floor.  Check in with surgery information desk.) To find out your arrival time please call [phone] between 1PM - 3PM on 07-17-17 FRIDAY  Remember: Instructions that are not followed completely may result in serious medical risk, up to and including death, or upon the discretion of your surgeon and anesthesiologist your surgery may need to be rescheduled.    _x___ 1. Do not eat food after midnight the night before your procedure. NO GUM OR CANDY AFTER MIDNIGHT.  You may drink clear liquids up to 2 hours before you are scheduled to arrive at the hospital for your procedure.  Do not drink clear liquids within 2 hours of your scheduled arrival to the hospital.  Clear liquids include  --Water or Apple juice without pulp  --Clear carbohydrate beverage such as ClearFast or Gatorade  --Black Coffee or Clear Tea (No milk, no creamers, do not add anything to the coffee or Tea    __x__ 2. No Alcohol for 24 hours before or after surgery.   __x__3. No Smoking or e-cigarettes for 24 prior to surgery.  Do not use any chewable tobacco products for at least 6 hour prior to surgery   ____  4. Bring all medications with you on the day of surgery if instructed.    __x__ 5. Notify your doctor if there is any change in your medical condition     (cold, fever, infections).    x___6. On the morning of surgery brush your teeth with toothpaste and water.  You may rinse your mouth with mouth wash if you wish.  Do not swallow any toothpaste or mouthwash.   Do not wear jewelry, make-up, hairpins, clips or nail polish.  Do not wear lotions, powders, or perfumes. You may wear deodorant.  Do not shave 48 hours prior to surgery. Men may shave face and neck.  Do not bring valuables to the hospital.    [facility] is not responsible for any belongings or valuables.               Contacts, dentures or bridgework may not be worn into surgery.  Leave your suitcase in the car. After surgery it may be brought to your room.  For patients admitted to the hospital, discharge time is determined by your  treatment team.  _  Patients discharged the day of surgery will not be allowed to drive home.  You will need someone to drive you home and stay with you the night of your procedure.    Please read over the following fact sheets that you were given:   [facility] Preparing for Surgery and or MRSA Information   _x___ TAKE THE FOLLOWING MEDICATION THE MORNING OF SURGERY WITH A SMALL SIP OF WATER. These include:  1. LEVOTHYROXINE  2. FLOMAX (TAMSULOSIN)  3.  4.  5.  6.  ____Fleets enema or Magnesium Citrate as directed.   ____ Use CHG Soap or sage wipes as directed on instruction sheet   ____ Use inhalers on the day of surgery and bring to hospital day of surgery  ____ Stop Metformin and Janumet 2 days prior to surgery.    ____ Take 1/2 of usual insulin dose the night before surgery and none on the morning surgery.   _x___ Follow recommendations from Cardiologist, Pulmonologist or PCP regarding stopping Aspirin, Coumadin, Plavix ,Eliquis, Effient, or Pradaxa,  and Pletal-PT HAS ALREADY STOPPED ASPIRIN  X____Stop Anti-inflammatories such as Advil, Aleve, Ibuprofen, Motrin, Naproxen, Naprosyn, Goodies powders or aspirin products NOW-OK to take Tylenol    ____ Stop supplements until after surgery.    _X___ Bring C-Pap to the hospital.

## 2017-07-16 ENCOUNTER — Encounter
Admission: RE | Admit: 2017-07-16 | Discharge: 2017-07-16 | Disposition: A | Discharge status: Home / Self Care | Source: Ambulatory Visit | Attending: Urology | Admitting: Urology

## 2017-07-16 DIAGNOSIS — I2510 Atherosclerotic heart disease of native coronary artery without angina pectoris: Secondary | ICD-10-CM | POA: Diagnosis present

## 2017-07-19 MED ORDER — CEFAZOLIN SODIUM-DEXTROSE 2-4 GM/100ML-% IV SOLN
2.0000 g | INTRAVENOUS | Status: AC
  Administered 2017-07-20: 2 g via INTRAVENOUS
  Filled 2017-07-19: qty 100

## 2017-07-20 ENCOUNTER — Encounter
Admission: RE | Disposition: A | Discharge status: Home / Self Care | Payer: Self-pay | Source: Ambulatory Visit | Attending: Urology

## 2017-07-20 ENCOUNTER — Other Ambulatory Visit: Payer: Self-pay

## 2017-07-20 ENCOUNTER — Ambulatory Visit: Admitting: Certified Registered Nurse Anesthetist

## 2017-07-20 ENCOUNTER — Observation Stay
Admission: RE | Admit: 2017-07-20 | Discharge: 2017-07-21 | Disposition: A | Discharge status: Home / Self Care | Source: Ambulatory Visit | Attending: Urology | Admitting: Urology

## 2017-07-20 DIAGNOSIS — Z7982 Long term (current) use of aspirin: Secondary | ICD-10-CM | POA: Insufficient documentation

## 2017-07-20 DIAGNOSIS — Z79890 Hormone replacement therapy (postmenopausal): Secondary | ICD-10-CM | POA: Diagnosis not present

## 2017-07-20 DIAGNOSIS — R3914 Feeling of incomplete bladder emptying: Secondary | ICD-10-CM | POA: Diagnosis not present

## 2017-07-20 DIAGNOSIS — Z9989 Dependence on other enabling machines and devices: Secondary | ICD-10-CM | POA: Diagnosis not present

## 2017-07-20 DIAGNOSIS — Z9079 Acquired absence of other genital organ(s): Secondary | ICD-10-CM

## 2017-07-20 DIAGNOSIS — I2510 Atherosclerotic heart disease of native coronary artery without angina pectoris: Secondary | ICD-10-CM | POA: Diagnosis not present

## 2017-07-20 DIAGNOSIS — E039 Hypothyroidism, unspecified: Secondary | ICD-10-CM | POA: Diagnosis not present

## 2017-07-20 DIAGNOSIS — E7800 Pure hypercholesterolemia, unspecified: Secondary | ICD-10-CM | POA: Diagnosis not present

## 2017-07-20 DIAGNOSIS — Z79899 Other long term (current) drug therapy: Secondary | ICD-10-CM | POA: Insufficient documentation

## 2017-07-20 DIAGNOSIS — N401 Enlarged prostate with lower urinary tract symptoms: Secondary | ICD-10-CM | POA: Diagnosis present

## 2017-07-20 DIAGNOSIS — N138 Other obstructive and reflux uropathy: Secondary | ICD-10-CM | POA: Diagnosis not present

## 2017-07-20 HISTORY — PX: TRANSURETHRAL RESECTION OF PROSTATE: SHX73

## 2017-07-20 SURGERY — TURP (TRANSURETHRAL RESECTION OF PROSTATE)
Anesthesia: General | Site: Prostate | Wound class: Clean Contaminated

## 2017-07-20 MED ORDER — FENTANYL CITRATE (PF) 100 MCG/2ML IJ SOLN
INTRAMUSCULAR | Status: DC | PRN
  Administered 2017-07-20 (×2): 50 ug via INTRAVENOUS

## 2017-07-20 MED ORDER — ATORVASTATIN CALCIUM 80 MG PO TABS
80.0000 mg | ORAL_TABLET | Freq: Every evening | ORAL | Status: DC
  Filled 2017-07-20 (×2): qty 1

## 2017-07-20 MED ORDER — LACTATED RINGERS IV SOLN
INTRAVENOUS | Status: DC
  Administered 2017-07-21: 11:00:00 via INTRAVENOUS

## 2017-07-20 MED ORDER — LORATADINE 10 MG PO TABS
10.0000 mg | ORAL_TABLET | Freq: Every day | ORAL | Status: DC
  Administered 2017-07-21: 10 mg via ORAL
  Filled 2017-07-20: qty 1

## 2017-07-20 MED ORDER — MORPHINE SULFATE (PF) 2 MG/ML IV SOLN: 2.0000 mg | INTRAVENOUS | Status: DC | PRN

## 2017-07-20 MED ORDER — SENNOSIDES-DOCUSATE SODIUM 8.6-50 MG PO TABS
2.0000 | ORAL_TABLET | Freq: Every day | ORAL | Status: DC
  Filled 2017-07-20 (×2): qty 2

## 2017-07-20 MED ORDER — ONDANSETRON HCL 4 MG/2ML IJ SOLN
INTRAMUSCULAR | Status: AC
  Filled 2017-07-20: qty 2

## 2017-07-20 MED ORDER — VITAMIN D 1000 UNITS PO TABS
1000.0000 [IU] | ORAL_TABLET | Freq: Every day | ORAL | Status: DC
  Administered 2017-07-21: 1000 [IU] via ORAL
  Filled 2017-07-20: qty 1

## 2017-07-20 MED ORDER — ACETAMINOPHEN 10 MG/ML IV SOLN
INTRAVENOUS | Status: AC
  Filled 2017-07-20: qty 100

## 2017-07-20 MED ORDER — MIDAZOLAM HCL 2 MG/2ML IJ SOLN
INTRAMUSCULAR | Status: DC | PRN
  Administered 2017-07-20: 2 mg via INTRAVENOUS

## 2017-07-20 MED ORDER — ONDANSETRON HCL 4 MG/2ML IJ SOLN
INTRAMUSCULAR | Status: DC | PRN
  Administered 2017-07-20: 4 mg via INTRAVENOUS

## 2017-07-20 MED ORDER — KETOROLAC TROMETHAMINE 30 MG/ML IJ SOLN
INTRAMUSCULAR | Status: DC | PRN
  Administered 2017-07-20: 30 mg via INTRAVENOUS

## 2017-07-20 MED ORDER — ONDANSETRON HCL 4 MG/2ML IJ SOLN: 4.0000 mg | INTRAMUSCULAR | Status: DC | PRN

## 2017-07-20 MED ORDER — TAMSULOSIN HCL 0.4 MG PO CAPS
0.4000 mg | ORAL_CAPSULE | Freq: Two times a day (BID) | ORAL | Status: DC
  Administered 2017-07-20 – 2017-07-21 (×2): 0.4 mg via ORAL
  Filled 2017-07-20 (×2): qty 1

## 2017-07-20 MED ORDER — FAMOTIDINE 20 MG PO TABS
ORAL_TABLET | ORAL | Status: AC
  Administered 2017-07-20: 20 mg via ORAL
  Filled 2017-07-20: qty 1

## 2017-07-20 MED ORDER — BELLADONNA ALKALOIDS-OPIUM 16.2-60 MG RE SUPP: 1.0000 | Freq: Four times a day (QID) | RECTAL | Status: DC | PRN

## 2017-07-20 MED ORDER — LIDOCAINE HCL (PF) 2 % IJ SOLN
INTRAMUSCULAR | Status: AC
  Filled 2017-07-20: qty 10

## 2017-07-20 MED ORDER — LEVOTHYROXINE SODIUM 200 MCG PO TABS
200.0000 ug | ORAL_TABLET | Freq: Every day | ORAL | Status: DC
  Administered 2017-07-21: 200 ug via ORAL
  Filled 2017-07-20: qty 2
  Filled 2017-07-20: qty 1

## 2017-07-20 MED ORDER — DEXAMETHASONE SODIUM PHOSPHATE 10 MG/ML IJ SOLN
INTRAMUSCULAR | Status: AC
  Filled 2017-07-20: qty 1

## 2017-07-20 MED ORDER — FENTANYL CITRATE (PF) 100 MCG/2ML IJ SOLN
INTRAMUSCULAR | Status: AC
  Filled 2017-07-20: qty 2

## 2017-07-20 MED ORDER — CEFAZOLIN SODIUM-DEXTROSE 1-4 GM/50ML-% IV SOLN
1.0000 g | Freq: Three times a day (TID) | INTRAVENOUS | Status: AC
  Administered 2017-07-20 – 2017-07-21 (×2): 1 g via INTRAVENOUS
  Filled 2017-07-20 (×2): qty 50

## 2017-07-20 MED ORDER — DEXAMETHASONE SODIUM PHOSPHATE 10 MG/ML IJ SOLN
INTRAMUSCULAR | Status: DC | PRN
  Administered 2017-07-20: 10 mg via INTRAVENOUS

## 2017-07-20 MED ORDER — HYDROCODONE-ACETAMINOPHEN 7.5-325 MG PO TABS: 1.0000 | ORAL_TABLET | Freq: Once | ORAL | Status: DC | PRN

## 2017-07-20 MED ORDER — CEFAZOLIN SODIUM-DEXTROSE 2-3 GM-%(50ML) IV SOLR
INTRAVENOUS | Status: AC
  Filled 2017-07-20: qty 50

## 2017-07-20 MED ORDER — KETOROLAC TROMETHAMINE 30 MG/ML IJ SOLN: 30.0000 mg | Freq: Once | INTRAMUSCULAR | Status: DC | PRN

## 2017-07-20 MED ORDER — ACETAMINOPHEN 10 MG/ML IV SOLN
INTRAVENOUS | Status: DC | PRN
  Administered 2017-07-20: 1000 mg via INTRAVENOUS

## 2017-07-20 MED ORDER — PROMETHAZINE HCL 25 MG/ML IJ SOLN: 6.2500 mg | INTRAMUSCULAR | Status: DC | PRN

## 2017-07-20 MED ORDER — EPHEDRINE SULFATE 50 MG/ML IJ SOLN
INTRAMUSCULAR | Status: DC | PRN
  Administered 2017-07-20: 5 mg via INTRAVENOUS
  Administered 2017-07-20: 15 mg via INTRAVENOUS

## 2017-07-20 MED ORDER — ROCURONIUM BROMIDE 50 MG/5ML IV SOLN
INTRAVENOUS | Status: AC
  Filled 2017-07-20: qty 1

## 2017-07-20 MED ORDER — EPHEDRINE SULFATE 50 MG/ML IJ SOLN
INTRAMUSCULAR | Status: AC
  Filled 2017-07-20: qty 1

## 2017-07-20 MED ORDER — LIDOCAINE HCL (CARDIAC) 20 MG/ML IV SOLN
INTRAVENOUS | Status: DC | PRN
  Administered 2017-07-20: 80 mg via INTRAVENOUS

## 2017-07-20 MED ORDER — LACTATED RINGERS IV SOLN
INTRAVENOUS | Status: DC
  Administered 2017-07-20 (×2): via INTRAVENOUS

## 2017-07-20 MED ORDER — HYDROCODONE-ACETAMINOPHEN 5-325 MG PO TABS: 1.0000 | ORAL_TABLET | ORAL | Status: DC | PRN

## 2017-07-20 MED ORDER — FAMOTIDINE 20 MG PO TABS
20.0000 mg | ORAL_TABLET | Freq: Once | ORAL | Status: AC
  Administered 2017-07-20: 20 mg via ORAL

## 2017-07-20 MED ORDER — MIDAZOLAM HCL 2 MG/2ML IJ SOLN
INTRAMUSCULAR | Status: AC
  Filled 2017-07-20: qty 2

## 2017-07-20 MED ORDER — PROPOFOL 10 MG/ML IV BOLUS
INTRAVENOUS | Status: AC
  Filled 2017-07-20: qty 40

## 2017-07-20 MED ORDER — HYDROMORPHONE HCL 1 MG/ML IJ SOLN: 0.2500 mg | INTRAMUSCULAR | Status: DC | PRN

## 2017-07-20 MED ORDER — PROPOFOL 10 MG/ML IV BOLUS
INTRAVENOUS | Status: DC | PRN
  Administered 2017-07-20: 50 mg via INTRAVENOUS
  Administered 2017-07-20: 200 mg via INTRAVENOUS

## 2017-07-20 MED ORDER — MEPERIDINE HCL 50 MG/ML IJ SOLN: 6.2500 mg | INTRAMUSCULAR | Status: DC | PRN

## 2017-07-20 SURGICAL SUPPLY — 23 items
ADAPTER IRRIG TUBE 2 SPIKE SOL (ADAPTER) ×6 IMPLANT
BAG DRAIN CYSTO-URO LG1000N (MISCELLANEOUS) ×3 IMPLANT
BAG URO DRAIN 4000ML (MISCELLANEOUS) ×3 IMPLANT
CATH FOL 2WAY LX 24X30 (CATHETERS) IMPLANT
CATH FOLEY 3WAY 30CC 22FR (CATHETERS) ×3 IMPLANT
DRAPE UTILITY 15X26 TOWEL STRL (DRAPES) ×3 IMPLANT
ELECT LOOP 22F BIPOLAR SML (ELECTROSURGICAL) ×3
ELECTRODE LOOP 22F BIPOLAR SML (ELECTROSURGICAL) ×1 IMPLANT
GLOVE BIOGEL M 8.0 STRL (GLOVE) ×3 IMPLANT
GOWN STANDARD XL  REUSABL (MISCELLANEOUS) ×3 IMPLANT
GOWN STRL REUS W/ TWL LRG LVL3 (GOWN DISPOSABLE) ×2 IMPLANT
GOWN STRL REUS W/TWL LRG LVL3 (GOWN DISPOSABLE) ×4
HOLDER FOLEY CATH W/STRAP (MISCELLANEOUS) ×6 IMPLANT
KIT TURNOVER CYSTO (KITS) ×3 IMPLANT
LOOP CUT BIPOLAR 24F LRG (ELECTROSURGICAL) IMPLANT
PACK CYSTO AR (MISCELLANEOUS) ×3 IMPLANT
SET IRRIG Y TYPE TUR BLADDER L (SET/KITS/TRAYS/PACK) ×3 IMPLANT
SET IRRIGATING DISP (SET/KITS/TRAYS/PACK) ×3 IMPLANT
SOL .9 NS 3000ML IRR  AL (IV SOLUTION) ×8
SOL .9 NS 3000ML IRR UROMATIC (IV SOLUTION) ×4 IMPLANT
SYR TOOMEY 50ML (SYRINGE) IMPLANT
SYRINGE IRR TOOMEY STRL 70CC (SYRINGE) ×3 IMPLANT
WATER STERILE IRR 1000ML POUR (IV SOLUTION) ×3 IMPLANT

## 2017-07-20 NOTE — Interval H&P Note (Signed)
History and Physical Interval Note:  07/20/2017 12:15 PM  Adrian Marshall  has presented today for surgery, with the diagnosis of BPH with incomplete bladder emptying  The various methods of treatment have been discussed with the patient and family. After consideration of risks, benefits and other options for treatment, the patient has consented to  Procedure(s): TRANSURETHRAL RESECTION OF THE PROSTATE (TURP) (N/A) as a surgical intervention .  The patient's history has been reviewed, patient examined, no change in status, stable for surgery.  I have reviewed the patient's chart and labs.  Questions were answered to the patient's satisfaction.     Mathews

## 2017-07-20 NOTE — Anesthesia Procedure Notes (Signed)
Procedure Name: LMA Insertion Date/Time: 07/20/2017 1:31 PM Performed by: Dawayne Cirri I, CRNA Pre-anesthesia Checklist: Patient identified, Patient being monitored, Timeout performed, Emergency Drugs available and Suction available Patient Re-evaluated:Patient Re-evaluated prior to induction Oxygen Delivery Method: Circle system utilized Preoxygenation: Pre-oxygenation with 100% oxygen Induction Type: IV induction Ventilation: Mask ventilation without difficulty LMA: LMA inserted LMA Size: 4.5 Tube type: Oral Number of attempts: 1 Placement Confirmation: positive ETCO2 and breath sounds checked- equal and bilateral Tube secured with: Tape Dental Injury: Teeth and Oropharynx as per pre-operative assessment

## 2017-07-20 NOTE — Anesthesia Post-op Follow-up Note (Signed)
Anesthesia QCDR form completed.        

## 2017-07-20 NOTE — Anesthesia Preprocedure Evaluation (Addendum)
Anesthesia Evaluation  Patient identified by MRN, date of birth, ID band Patient awake    Reviewed: Allergy & Precautions, H&P , NPO status , reviewed documented beta blocker date and time   Airway Mallampati: II  TM Distance: >3 FB Neck ROM: limited    Dental  (+) Teeth Intact   Pulmonary sleep apnea and Continuous Positive Airway Pressure Ventilation ,  Brought CPAP to hospital   Pulmonary exam normal        Cardiovascular + CAD  Normal cardiovascular exam     Neuro/Psych negative neurological ROS  negative psych ROS   GI/Hepatic negative GI ROS, Neg liver ROS, neg GERD  ,  Endo/Other  Hypothyroidism   Renal/GU Renal disease     Musculoskeletal  (+) Arthritis ,   Abdominal   Peds  Hematology negative hematology ROS (+)   Anesthesia Other Findings OSA/CPAP  BPH (benign prostatic hyperplasia)  . Hypercholesterolemia  . Renal disorder   kidney stones . Thyroid disease    Surgical History: Past Surgical History: Procedure Laterality Date . ANKLE SURGERY   . HERNIA REPAIR   . KNEE SURGERY   . NECK SURGERY   . SHOULDER SURGERY   . VASECTOMY       Reproductive/Obstetrics                            Anesthesia Physical Anesthesia Plan  ASA: II  Anesthesia Plan: General LMA   Post-op Pain Management:    Induction:   PONV Risk Score and Plan: 2 and Ondansetron, Midazolam and Promethazine  Airway Management Planned:   Additional Equipment:   Intra-op Plan:   Post-operative Plan:   Informed Consent: I have reviewed the patients History and Physical, chart, labs and discussed the procedure including the risks, benefits and alternatives for the proposed anesthesia with the patient or authorized representative who has indicated his/her understanding and acceptance.   Dental Advisory Given  Plan Discussed with:   Anesthesia Plan Comments:         Anesthesia Quick Evaluation

## 2017-07-20 NOTE — Progress Notes (Signed)
Pt has a cpap machine from home. The machine was checked for frays and none were found. The machine looks in good working condition. [employer] was contacted.

## 2017-07-20 NOTE — Progress Notes (Signed)
Pt. was admitted from PACU. He brought in CPAP with him. Pt. said, he was instructed in pre -op to bring his personal CPAP from home to use overnight. Surgeon was notified of patient needs via voicemail.

## 2017-07-20 NOTE — Op Note (Signed)
Preoperative diagnosis: 1. Bladder outlet obstruction secondary to BPH  Postoperative diagnosis:  1. Bladder outlet obstruction secondary to BPH  Procedure:  1. Transurethral resection of the prostate  Surgeon: Nicki Reaper C. Akaisha Truman M.D.  Anesthesia: General  Complications: None  EBL: Minimal  Specimens: 1. Prostate chips   Indication: Adrian Marshall is a patient with bladder outlet obstruction secondary to benign prostatic hyperplasia.  PVR by bladder scan was approximately 195 mL.  Cystoscopy shows lateral lobe enlargement.  Prostate volume was estimated at 46 cc by transabdominal ultrasound.  He has severe lower urinary tract symptoms on tamsulosin 0.8 mg.  He has previously been on finasteride and unable to tolerate secondary to side effects.  After reviewing the management options for treatment, he elected to proceed with the above surgical procedure(s). We have discussed the potential benefits and risks of the procedure, side effects of the proposed treatment, the likelihood of the patient achieving the goals of the procedure, and any potential problems that might occur during the procedure or recuperation. Informed consent has been obtained.  Description of procedure:  The patient was taken to the operating room and general anesthesia was induced.  The patient was placed in the dorsal lithotomy position, prepped and draped in the usual sterile fashion, and preoperative antibiotics were administered. A preoperative time-out was performed.   A 27 French continuous-flow resectoscope sheath with visual obturator was lubricated and passed under direct vision.  The urethra was normal in caliber without stricture.  Once in the bladder the obturator was removed and replaced with an Beatrix Fetters resectoscope with loop.  The prostate was remarkable for lateral lobe enlargement and moderate bladder neck elevation.  There was no median lobe tissue present.    The bladder was then systematically  examined in its entirety. There was no evidence of any bladder tumors, stones, or other mucosal pathology.  The ureteral orifices were identified and marked so as to be avoided during the procedure.  The prostate adenoma was then resected utilizing loop cautery resection with the bipolar cutting loop.  The prostate adenoma from the bladder neck back to the verumontanum was resected beginning at the six o'clock position and then extended to include the right and left lobes of the prostate and anterior prostate. Care was taken not to resect distal to the verumontanum.  Hemostasis was then achieved with the cautery and the bladder was emptied and reinspected with no significant bleeding noted at the end of the procedure.  All chips were removed by syringe irrigation.  With the inflow low no significant bleeding was noted.  A 22 French 3 way catheter was then placed into the bladder and placed on continuous bladder irrigation with return of clear effluent.  The patient appeared to tolerate the procedure well and without complications.  After anesthetic reversal he was transported to the PACU in satisfactory condition.     Abbie Sons, MD

## 2017-07-20 NOTE — Transfer of Care (Signed)
Immediate Anesthesia Transfer of Care Note  Patient: Adrian Marshall [facility]  Procedure(s) Performed: TRANSURETHRAL RESECTION OF THE PROSTATE (TURP) (N/A Prostate)  Patient Location: PACU  Anesthesia Type:General  Level of Consciousness: drowsy  Airway & Oxygen Therapy: Patient Spontanous Breathing  Post-op Assessment: Report given to RN  Post vital signs: stable  Last Vitals:  Vitals Value Taken Time  BP 113/64 07/20/2017  2:57 PM  Temp 36.4 C 07/20/2017  2:57 PM  Pulse 88 07/20/2017  2:57 PM  Resp 16 07/20/2017  2:57 PM  SpO2 98 % 07/20/2017  2:57 PM  Vitals shown include unvalidated device data.  Last Pain:  Vitals:   07/20/17 1136  TempSrc: Oral  PainSc:          Complications: No apparent anesthesia complications

## 2017-07-21 ENCOUNTER — Encounter: Payer: Self-pay | Admitting: Urology

## 2017-07-21 DIAGNOSIS — N401 Enlarged prostate with lower urinary tract symptoms: Secondary | ICD-10-CM | POA: Diagnosis not present

## 2017-07-21 LAB — HEMOGLOBIN AND HEMATOCRIT, BLOOD
HEMATOCRIT: 42.1 % (ref 40.0–52.0)
Hemoglobin: 14.3 g/dL (ref 13.0–18.0)

## 2017-07-21 MED ORDER — SULFAMETHOXAZOLE-TRIMETHOPRIM 800-160 MG PO TABS: 1.0000 | ORAL_TABLET | Freq: Two times a day (BID) | ORAL | 0 refills | Status: AC

## 2017-07-21 NOTE — Progress Notes (Signed)
Urology POD1  No problems overnight.  Mild catheter discomfort VSS  Afebrile  Abdomen soft CBI effluent clear on low flow.  Impression: Doing well status post TURP  Plan: His CBI was discontinued and he was checked approximately 2 hours later and his urine remained clear.  The Foley catheter was removed.  After voiding we will check a PVR and anticipate discharge later today.

## 2017-07-21 NOTE — Discharge Summary (Signed)
Date of admission: 07/20/2017  Date of discharge: 07/21/2017  Admission diagnosis: BPH with obstruction  Discharge diagnosis: Same  Secondary diagnoses:  Patient Active Problem List   Diagnosis Date Noted  . S/P TURP 07/20/2017  . Benign prostatic hyperplasia with incomplete bladder emptying 06/17/2017  . Nephrolithiasis 06/17/2017    History and Physical: For full details, please see admission history and physical. Briefly, Adrian Marshall is a 62 y.o. year old patient with lower urinary tract symptoms with incomplete bladder emptying secondary to BPH.  He was admitted for TURP which he underwent on 04/17/4456 without complications.  Hospital Course: Patient tolerated the procedure well.  He was then transferred to the floor after an uneventful PACU stay.  His hospital course was uncomplicated.  His CBI was discontinued on postoperative day #1.  His urine remained clear and his Foley catheter was removed.  He was voiding blood-tinged urine without problems.  On POD#1 he had met discharge criteria: was eating a regular diet, was up and ambulating independently,  pain was well controlled, was voiding without a catheter, and was ready to for discharge.   Laboratory values:  Recent Labs    07/21/17 0536  HGB 14.3  HCT 42.1   No results for input(s): NA, K, CL, CO2, GLUCOSE, BUN, CREATININE, CALCIUM in the last 72 hours. No results for input(s): LABPT, INR in the last 72 hours. No results for input(s): LABURIN in the last 72 hours. Results for orders placed or performed in visit on 07/01/17  Microscopic Examination     Status: Abnormal   Collection Time: 07/01/17  1:56 PM  Result Value Ref Range Status   WBC, UA None seen 0 - 5 /hpf Final   RBC, UA 11-30 (A) 0 - 2 /hpf Final   Epithelial Cells (non renal) None seen 0 - 10 /hpf Final   Mucus, UA Present (A) Not Estab. Final   Bacteria, UA Few (A) None seen/Few Final  CULTURE, URINE COMPREHENSIVE     Status: None   Collection Time:  07/01/17  3:15 PM  Result Value Ref Range Status   Urine Culture, Comprehensive Final report  Final   Organism ID, Bacteria Comment  Final    Comment: No growth in 36 - 48 hours.    Disposition: Home  Discharge instruction: The patient was instructed to be ambulatory but told to refrain from heavy lifting, strenuous activity, or driving.  Refer to detailed discharge instructions  Discharge medications:  Refer to detailed discharge instructions  Followup:  08/19/2017 at 115

## 2017-07-21 NOTE — Progress Notes (Signed)
Discharge teaching given to patient, patient verbalized understanding and had no questions. Patient IV removed. Patient will be transported home by family. All patient belongings gathered prior to leaving.  

## 2017-07-21 NOTE — Care Management Obs Status (Signed)
[facility] NOTIFICATION   Patient Details  Name: Adrian Marshall MRN: [id number] Date of Birth: 11/04/1955   Medicare Observation Status Notification Given:  No    Beverly Sessions, RN 07/21/2017, 2:39 PM

## 2017-07-21 NOTE — Progress Notes (Signed)
Pt stated that he doesn't need any help with cpap

## 2017-07-21 NOTE — Anesthesia Postprocedure Evaluation (Signed)
Anesthesia Post Note  Patient: Adrian Marshall  Procedure(s) Performed: TRANSURETHRAL RESECTION OF THE PROSTATE (TURP) (N/A Prostate)  Patient location during evaluation: PACU Anesthesia Type: General Level of consciousness: awake and alert Pain management: pain level controlled Vital Signs Assessment: post-procedure vital signs reviewed and stable Respiratory status: spontaneous breathing, nonlabored ventilation and respiratory function stable Cardiovascular status: blood pressure returned to baseline and stable Postop Assessment: no apparent nausea or vomiting Anesthetic complications: no     Last Vitals:  Vitals:   07/20/17 1956 07/21/17 0411  BP: 137/62 115/72  Pulse: 85 84  Resp: 20 20  Temp: 36.7 C (!) 36.4 C  SpO2: 97% 98%    Last Pain:  Vitals:   07/21/17 0200  TempSrc:   PainSc: Asleep                 Alphonsus Sias

## 2017-07-22 ENCOUNTER — Emergency Department (HOSPITAL_COMMUNITY)
Admission: EM | Admit: 2017-07-22 | Discharge: 2017-07-22 | Disposition: A | Discharge status: Home / Self Care | Attending: Emergency Medicine | Admitting: Emergency Medicine

## 2017-07-22 ENCOUNTER — Other Ambulatory Visit: Payer: Self-pay

## 2017-07-22 ENCOUNTER — Encounter (HOSPITAL_COMMUNITY): Payer: Self-pay

## 2017-07-22 DIAGNOSIS — Z79899 Other long term (current) drug therapy: Secondary | ICD-10-CM | POA: Insufficient documentation

## 2017-07-22 DIAGNOSIS — I2510 Atherosclerotic heart disease of native coronary artery without angina pectoris: Secondary | ICD-10-CM | POA: Insufficient documentation

## 2017-07-22 DIAGNOSIS — R1030 Lower abdominal pain, unspecified: Secondary | ICD-10-CM | POA: Insufficient documentation

## 2017-07-22 DIAGNOSIS — Z9079 Acquired absence of other genital organ(s): Secondary | ICD-10-CM | POA: Diagnosis not present

## 2017-07-22 DIAGNOSIS — Z85828 Personal history of other malignant neoplasm of skin: Secondary | ICD-10-CM | POA: Diagnosis not present

## 2017-07-22 DIAGNOSIS — E039 Hypothyroidism, unspecified: Secondary | ICD-10-CM | POA: Insufficient documentation

## 2017-07-22 DIAGNOSIS — R339 Retention of urine, unspecified: Secondary | ICD-10-CM

## 2017-07-22 LAB — I-STAT CHEM 8, ED
BUN: 16 mg/dL (ref 6–20)
CHLORIDE: 104 mmol/L (ref 101–111)
Calcium, Ion: 1.11 mmol/L — ABNORMAL LOW (ref 1.15–1.40)
Creatinine, Ser: 1.4 mg/dL — ABNORMAL HIGH (ref 0.61–1.24)
Glucose, Bld: 112 mg/dL — ABNORMAL HIGH (ref 65–99)
HEMATOCRIT: 43 % (ref 39.0–52.0)
HEMOGLOBIN: 14.6 g/dL (ref 13.0–17.0)
POTASSIUM: 3.9 mmol/L (ref 3.5–5.1)
Sodium: 144 mmol/L (ref 135–145)
TCO2: 26 mmol/L (ref 22–32)

## 2017-07-22 LAB — URINALYSIS, ROUTINE W REFLEX MICROSCOPIC
BACTERIA UA: NONE SEEN
Bilirubin Urine: NEGATIVE
Glucose, UA: NEGATIVE mg/dL
KETONES UR: NEGATIVE mg/dL
NITRITE: NEGATIVE
PROTEIN: 100 mg/dL — AB
SQUAMOUS EPITHELIAL / LPF: NONE SEEN
Specific Gravity, Urine: 1.014 (ref 1.005–1.030)
pH: 7 (ref 5.0–8.0)

## 2017-07-22 LAB — SURGICAL PATHOLOGY

## 2017-07-22 NOTE — ED Triage Notes (Signed)
Pt reports had TURP on Monday and d/c'd yesterday.  Reports had been voiding fine but unable to void since 3 am this morning.  Pt voided small amount in ed but no relief.

## 2017-07-22 NOTE — ED Notes (Signed)
Bladder scan showed 170 of urine in bladder

## 2017-07-22 NOTE — ED Notes (Signed)
Leg bag on patient. Instruction given for leg bag, drainage bag and cleaning.  Pt understood

## 2017-07-22 NOTE — Discharge Instructions (Addendum)
Take your usual prescriptions as previously directed. Keep the foley catheter in place until you are seen in follow up by the Urologist.  Call your regular Urologist today to schedule a follow up appointment within the next 3 days.  Return to the Emergency Department immediately sooner if worsening.

## 2017-07-22 NOTE — ED Provider Notes (Signed)
[facility] EMERGENCY DEPARTMENT Provider Note   CSN: [id number] Arrival date & time: 07/22/17  3664     History   Chief Complaint Chief Complaint  Patient presents with  . Urinary Retention    HPI KNOWLEDGE Adrian Marshall is a 62 y.o. male.  HPI  Pt was seen at 0750. Per pt and his wife, c/o gradual onset and persistence of constant urinary retention that began at 0300 this morning. Pt states that was the last time he was able to urinate "just a little bit." Has been associated with suprapubic discomfort and faint hematuria. Pt is s/p TURP 2 days ago [facility] Dr. Bernardo Heater). Denies fevers, no dysuria, no back pain, no CP/SOB, no testicular pain/swelling.    Past Medical History:  Diagnosis Date  . Arthritis    NECK  . BPH (benign prostatic hyperplasia)   . Cancer (HCC)    SKIN CANCER-BASAL  . Coronary artery disease   . History of kidney stones   . Hypercholesterolemia   . Hypothyroidism   . Renal disorder    kidney stones  . Sleep apnea    USES CPAP  . Thyroid disease     Patient Active Problem List   Diagnosis Date Noted  . S/P TURP 07/20/2017  . Benign prostatic hyperplasia with incomplete bladder emptying 06/17/2017  . Nephrolithiasis 06/17/2017    Past Surgical History:  Procedure Laterality Date  . ANKLE SURGERY    . HERNIA REPAIR    . KNEE SURGERY    . NECK SURGERY    . SHOULDER SURGERY    . TRANSURETHRAL RESECTION OF PROSTATE N/A 07/20/2017   Procedure: TRANSURETHRAL RESECTION OF THE PROSTATE (TURP);  Surgeon: Abbie Sons, MD;  Location: [facility] ORS;  Service: Urology;  Laterality: N/A;  . VASECTOMY          Home Medications    Prior to Admission medications   Medication Sig Start Date End Date Taking? Authorizing Provider  acetaminophen (TYLENOL) 500 MG tablet Take 500 mg by mouth daily as needed for moderate pain or headache.    [provider]  atorvastatin (LIPITOR) 80 MG tablet Take 80 mg by mouth every evening.     [provider]  cetirizine (ZYRTEC) 10 MG tablet Take 10 mg by mouth daily as needed for allergies.    [provider]  cholecalciferol (VITAMIN D) 1000 units tablet Take 1,000 Units by mouth daily.    [provider]  levothyroxine (SYNTHROID, LEVOTHROID) 200 MCG tablet Take 200 mcg by mouth daily before breakfast.    [provider]  sulfamethoxazole-trimethoprim (BACTRIM DS,SEPTRA DS) 800-160 MG tablet Take 1 tablet by mouth 2 (two) times daily for 3 days. 07/21/17 07/24/17  Stoioff, Ronda Fairly, MD  tamsulosin (FLOMAX) 0.4 MG CAPS capsule Take 0.4 mg by mouth 2 (two) times daily.     [provider]    Family History Family History  Problem Relation Age of Onset  . Prostate cancer Neg Hx   . Bladder Cancer Neg Hx   . Kidney cancer Neg Hx     Social History Social History   Tobacco Use  . Smoking status: Never Smoker  . Smokeless tobacco: Never Used  Substance Use Topics  . Alcohol use: Yes    Comment: RARE  . Drug use: No     Allergies   Cortisone; Neurontin [gabapentin]; and Other   Review of Systems Review of Systems ROS: Statement: All systems negative except as marked or noted in  the HPI; Constitutional: Negative for fever and chills. ; ; Eyes: Negative for eye pain, redness and discharge. ; ; ENMT: Negative for ear pain, hoarseness, nasal congestion, sinus pressure and sore throat. ; ; Cardiovascular: Negative for chest pain, palpitations, diaphoresis, dyspnea and peripheral edema. ; ; Respiratory: Negative for cough, wheezing and stridor. ; ; Gastrointestinal: Negative for nausea, vomiting, diarrhea, abdominal pain, blood in stool, hematemesis, jaundice and rectal bleeding. . ; ; Genitourinary: Negative for dysuria, flank pain and +urinary retention, hematuria. ; ; Genital:  No penile drainage or rash, no testicular pain or swelling, no scrotal rash or swelling. ;; Musculoskeletal: Negative for back pain and neck pain. Negative for swelling  and trauma.; ; Skin: Negative for pruritus, rash, abrasions, blisters, bruising and skin lesion.; ; Neuro: Negative for headache, lightheadedness and neck stiffness. Negative for weakness, altered level of consciousness, altered mental status, extremity weakness, paresthesias, involuntary movement, seizure and syncope.       Physical Exam Updated Vital Signs BP (!) 135/95 (BP Location: Right Arm)   Pulse (!) 111   Temp 98 F (36.7 C) (Oral)   Resp 18   Ht 5\' 10"  (1.778 m)   Wt 85.7 kg (189 lb)   SpO2 96%   BMI 27.12 kg/m   Physical Exam 0755: Physical examination:  Nursing notes reviewed; Vital signs and O2 SAT reviewed;  Constitutional: Well developed, Well nourished, Well hydrated, Uncomfortable appearing.; Head:  Normocephalic, atraumatic; Eyes: EOMI, PERRL, No scleral icterus; ENMT: Mouth and pharynx normal, Mucous membranes moist; Neck: Supple, Full range of motion, No lymphadenopathy; Cardiovascular: Regular rate and rhythm, No gallop; Respiratory: Breath sounds clear & equal bilaterally, No wheezes.  Speaking full sentences with ease, Normal respiratory effort/excursion; Chest: Nontender, Movement normal; Abdomen: Soft, Nontender, Nondistended, Normal bowel sounds; Genitourinary: No CVA tenderness; Extremities: Peripheral pulses normal, No tenderness, No edema, No calf edema or asymmetry.; Neuro: AA&Ox3, Major CN grossly intact.  Speech clear. No gross focal motor or sensory deficits in extremities. Climbs on and off stretcher easily by himself. Gait steady..; Skin: Color normal, Warm, Dry.   ED Treatments / Results  Labs (all labs ordered are listed, but only abnormal results are displayed)   EKG None  Radiology   Procedures Procedures (including critical care time)  Medications Ordered in ED Medications - No data to display   Initial Impression / Assessment and Plan / ED Course  I have reviewed the triage vital signs and the nursing notes.  Pertinent labs &  imaging results that were available during my care of the patient were reviewed by me and considered in my medical decision making (see chart for details).  MDM Reviewed: previous chart, nursing note and vitals Reviewed previous: labs Interpretation: labs   Results for orders placed or performed during the hospital encounter of 07/22/17  Urinalysis, Routine w reflex microscopic  Result Value Ref Range   Color, Urine YELLOW YELLOW   APPearance HAZY (A) CLEAR   Specific Gravity, Urine 1.014 1.005 - 1.030   pH 7.0 5.0 - 8.0   Glucose, UA NEGATIVE NEGATIVE mg/dL   Hgb urine dipstick LARGE (A) NEGATIVE   Bilirubin Urine NEGATIVE NEGATIVE   Ketones, ur NEGATIVE NEGATIVE mg/dL   Protein, ur 100 (A) NEGATIVE mg/dL   Nitrite NEGATIVE NEGATIVE   Leukocytes, UA TRACE (A) NEGATIVE   RBC / HPF TOO NUMEROUS TO COUNT 0 - 5 RBC/hpf   WBC, UA 6-30 0 - 5 WBC/hpf   Bacteria, UA NONE SEEN NONE SEEN  Squamous Epithelial / LPF NONE SEEN NONE SEEN   Mucus PRESENT    Budding Yeast PRESENT    Uric Acid Crys, UA PRESENT   I-stat Chem 8, ED  Result Value Ref Range   Sodium 144 135 - 145 mmol/L   Potassium 3.9 3.5 - 5.1 mmol/L   Chloride 104 101 - 111 mmol/L   BUN 16 6 - 20 mg/dL   Creatinine, Ser 1.40 (H) 0.61 - 1.24 mg/dL   Glucose, Bld 112 (H) 65 - 99 mg/dL   Calcium, Ion 1.11 (L) 1.15 - 1.40 mmol/L   TCO2 26 22 - 32 mmol/L   Hemoglobin 14.6 13.0 - 17.0 g/dL   HCT 43.0 39.0 - 52.0 %    0950:  BUN/Cr per baseline labs (05/18/2017 BUN 13/Cr 1.47). No UTI on Udip. Pt feels "much better now" after several hundred ml urine emptied from bladder after foley placed. T/C returned from Uro Dr. Bernardo Heater, case discussed, including:  HPI, pertinent PM/SHx, VS/PE, dx testing, ED course and treatment:  Agrees with ED eval, likely will keep foley in place until Monday, will f/u pt in office, requests to have pt call for appt. Dx and testing, as well as d/w Uro MD, d/w pt and family.  Questions answered.  Verb  understanding, agreeable to d/c home with outpt f/u.    Final Clinical Impressions(s) / ED Diagnoses   Final diagnoses:  None    ED Discharge Orders    None       Francine Graven, DO 07/25/17 1307

## 2017-07-24 LAB — URINE CULTURE: Culture: NO GROWTH

## 2017-07-27 ENCOUNTER — Ambulatory Visit (INDEPENDENT_AMBULATORY_CARE_PROVIDER_SITE_OTHER): Admitting: Family Medicine

## 2017-07-27 DIAGNOSIS — Z9079 Acquired absence of other genital organ(s): Secondary | ICD-10-CM

## 2017-07-27 NOTE — Progress Notes (Signed)
Catheter Removal  Patient is present today for a catheter removal.  17ml of water was drained from the balloon. A 16Fr Coude foley cath was removed from the bladder no complications were noted . Patient tolerated well.  Preformed by: Elberta Leatherwood, CMA

## 2017-08-06 ENCOUNTER — Telehealth: Payer: Self-pay | Admitting: Family Medicine

## 2017-08-06 NOTE — Telephone Encounter (Signed)
Patient called today stating he still has blood in his urine. He had TURP 07/20/2017. He read in his handout that if he still has blood after 2 weeks to call Provider. He is drinking 6 bottles of water a day. He states the blood is at the beginning of his stream and then it clears up. The color is light red. I told him I think it is normal but want to confirm.  Please advise.

## 2017-08-06 NOTE — Telephone Encounter (Signed)
Intermittent blood in the urine is common for several weeks

## 2017-08-07 NOTE — Telephone Encounter (Signed)
Patient notified and voiced understanding.

## 2017-08-12 ENCOUNTER — Other Ambulatory Visit: Payer: Self-pay

## 2017-08-12 ENCOUNTER — Ambulatory Visit (INDEPENDENT_AMBULATORY_CARE_PROVIDER_SITE_OTHER)

## 2017-08-12 DIAGNOSIS — R3915 Urgency of urination: Secondary | ICD-10-CM

## 2017-08-12 LAB — MICROSCOPIC EXAMINATION: EPITHELIAL CELLS (NON RENAL): NONE SEEN /HPF (ref 0–10)

## 2017-08-12 LAB — URINALYSIS, COMPLETE
Bilirubin, UA: NEGATIVE
Glucose, UA: NEGATIVE
Ketones, UA: NEGATIVE
Nitrite, UA: NEGATIVE
PH UA: 6 (ref 5.0–7.5)
Specific Gravity, UA: <1.005 — ABNORMAL LOW (ref 1.005–1.030)
Urobilinogen, Ur: 0.2 mg/dL (ref 0.2–1.0)

## 2017-08-12 LAB — BLADDER SCAN AMB NON-IMAGING

## 2017-08-12 NOTE — Progress Notes (Signed)
Patient called the office today post TURP complaining of being unable to void and severe bladder spasm pain.   Patient was however able to urinate when he came to the office. PVR was 190ml . UA was done today and was not grossly positive for infection.    Per Dr. Bernardo Heater patient was given samples of Myrbetriq 50mg  to help with spasm pain and discomfort. Patient is to keep post op follow up apt next week

## 2017-08-14 ENCOUNTER — Encounter (HOSPITAL_COMMUNITY): Payer: Self-pay

## 2017-08-14 ENCOUNTER — Emergency Department (HOSPITAL_COMMUNITY)
Admission: EM | Admit: 2017-08-14 | Discharge: 2017-08-15 | Disposition: A | Discharge status: Home / Self Care | Attending: Emergency Medicine | Admitting: Emergency Medicine

## 2017-08-14 DIAGNOSIS — E039 Hypothyroidism, unspecified: Secondary | ICD-10-CM | POA: Diagnosis not present

## 2017-08-14 DIAGNOSIS — N23 Unspecified renal colic: Secondary | ICD-10-CM

## 2017-08-14 DIAGNOSIS — N132 Hydronephrosis with renal and ureteral calculous obstruction: Secondary | ICD-10-CM | POA: Diagnosis not present

## 2017-08-14 DIAGNOSIS — I2510 Atherosclerotic heart disease of native coronary artery without angina pectoris: Secondary | ICD-10-CM | POA: Insufficient documentation

## 2017-08-14 DIAGNOSIS — N390 Urinary tract infection, site not specified: Secondary | ICD-10-CM | POA: Diagnosis not present

## 2017-08-14 DIAGNOSIS — Z85828 Personal history of other malignant neoplasm of skin: Secondary | ICD-10-CM | POA: Insufficient documentation

## 2017-08-14 DIAGNOSIS — Z87442 Personal history of urinary calculi: Secondary | ICD-10-CM | POA: Diagnosis not present

## 2017-08-14 DIAGNOSIS — Z79899 Other long term (current) drug therapy: Secondary | ICD-10-CM | POA: Insufficient documentation

## 2017-08-14 DIAGNOSIS — R339 Retention of urine, unspecified: Secondary | ICD-10-CM | POA: Diagnosis present

## 2017-08-14 MED ORDER — ONDANSETRON HCL 4 MG/2ML IJ SOLN
4.0000 mg | Freq: Once | INTRAMUSCULAR | Status: AC
  Administered 2017-08-15: 4 mg via INTRAVENOUS
  Filled 2017-08-14: qty 2

## 2017-08-14 MED ORDER — SODIUM CHLORIDE 0.9 % IV BOLUS
1000.0000 mL | Freq: Once | INTRAVENOUS | Status: AC
  Administered 2017-08-15: 1000 mL via INTRAVENOUS

## 2017-08-14 MED ORDER — KETOROLAC TROMETHAMINE 30 MG/ML IJ SOLN
30.0000 mg | Freq: Once | INTRAMUSCULAR | Status: AC
  Administered 2017-08-14: 30 mg via INTRAMUSCULAR
  Filled 2017-08-14: qty 1

## 2017-08-14 MED ORDER — HYDROMORPHONE HCL 1 MG/ML IJ SOLN
1.0000 mg | Freq: Once | INTRAMUSCULAR | Status: AC
  Administered 2017-08-15: 1 mg via INTRAVENOUS
  Filled 2017-08-14: qty 1

## 2017-08-14 NOTE — ED Provider Notes (Signed)
[facility] EMERGENCY DEPARTMENT Provider Note   CSN: [id number] Arrival date & time: 08/14/17  2156     History   Chief Complaint Chief Complaint  Patient presents with  . Urinary Retention    HPI Adrian Marshall is a 62 y.o. male.  Patient presents with difficulty with urination, bladder pain, penis pain, rectum pain and perineal pain.  Symptoms have been intermittent for the past week.  Had TURP on April 8, catheter placed in ED on April 10th and kept for 5 days. Patient saw his urologist at [facility] 2 days ago and was given medication for bladder spasm.  States he has been having intermittent difficulty with urination over the past week.  Catheter was placed on arrival to the ED today with only 200 cc draining.  Patient states he has had bloody urine since his surgery.  No fever or vomiting.  No change in bowel movements.  Patient states he has had similar symptoms in the past with prostatitis and this feels similar. Patient reports minimal improvement with catheter placement.  He states he does have a known kidney stone but denies any flank pain.  The history is provided by the patient.    Past Medical History:  Diagnosis Date  . Arthritis    NECK  . BPH (benign prostatic hyperplasia)   . Cancer (HCC)    SKIN CANCER-BASAL  . Coronary artery disease   . History of kidney stones   . Hypercholesterolemia   . Hypothyroidism   . Renal disorder    kidney stones  . Sleep apnea    USES CPAP  . Thyroid disease     Patient Active Problem List   Diagnosis Date Noted  . S/P TURP 07/20/2017  . Benign prostatic hyperplasia with incomplete bladder emptying 06/17/2017  . Nephrolithiasis 06/17/2017    Past Surgical History:  Procedure Laterality Date  . ANKLE SURGERY    . HERNIA REPAIR    . KNEE SURGERY    . NECK SURGERY    . SHOULDER SURGERY    . TRANSURETHRAL RESECTION OF PROSTATE N/A 07/20/2017   Procedure: TRANSURETHRAL RESECTION OF THE PROSTATE (TURP);  Surgeon:  Abbie Sons, MD;  Location: [facility] ORS;  Service: Urology;  Laterality: N/A;  . VASECTOMY          Home Medications    Prior to Admission medications   Medication Sig Start Date End Date Taking? Authorizing Provider  acetaminophen (TYLENOL) 500 MG tablet Take 500 mg by mouth daily as needed for moderate pain or headache.   Yes [provider]  atorvastatin (LIPITOR) 80 MG tablet Take 80 mg by mouth every evening.    Yes [provider]  cetirizine (ZYRTEC) 10 MG tablet Take 10 mg by mouth daily as needed for allergies.   Yes [provider]  cholecalciferol (VITAMIN D) 1000 units tablet Take 1,000 Units by mouth daily.   Yes [provider]  dorzolamide (TRUSOPT) 2 % ophthalmic solution Place 1 drop into both eyes 2 (two) times daily. 07/19/17  Yes [provider]  levothyroxine (SYNTHROID, LEVOTHROID) 200 MCG tablet Take 200 mcg by mouth daily before breakfast.   Yes [provider]  mirabegron ER (MYRBETRIQ) 50 MG TB24 tablet Take 50 mg by mouth daily.   Yes [provider]  tamsulosin (FLOMAX) 0.4 MG CAPS capsule Take 0.4 mg by mouth 2 (two) times daily.    Yes [provider]    Family History Family History  Problem  Relation Age of Onset  . Prostate cancer Neg Hx   . Bladder Cancer Neg Hx   . Kidney cancer Neg Hx     Social History Social History   Tobacco Use  . Smoking status: Never Smoker  . Smokeless tobacco: Never Used  Substance Use Topics  . Alcohol use: Yes    Comment: RARE  . Drug use: No     Allergies   Cortisone; Neurontin [gabapentin]; and Other   Review of Systems Review of Systems  Constitutional: Negative for activity change, appetite change and fever.  Respiratory: Negative for cough, chest tightness and shortness of breath.   Cardiovascular: Negative for chest pain.  Gastrointestinal: Positive for abdominal pain and rectal pain. Negative for nausea and vomiting.    Genitourinary: Positive for decreased urine volume, difficulty urinating, dysuria, frequency, hematuria, penile pain and testicular pain. Negative for flank pain.  Musculoskeletal: Negative for arthralgias and myalgias.  Neurological: Negative for dizziness, weakness, light-headedness and headaches.    all other systems are negative except as noted in the HPI and PMH.    Physical Exam Updated Vital Signs BP 125/90 (BP Location: Right Arm)   Pulse (!) 126   Temp 99.6 F (37.6 C) (Oral)   Resp (!) 22   SpO2 100%   Physical Exam  Constitutional: He is oriented to person, place, and time. He appears well-developed and well-nourished. No distress.  uncomfortable  HENT:  Head: Normocephalic and atraumatic.  Mouth/Throat: Oropharynx is clear and moist. No oropharyngeal exudate.  Eyes: Pupils are equal, round, and reactive to light. Conjunctivae and EOM are normal.  Neck: Normal range of motion. Neck supple.  No meningismus.  Cardiovascular: Normal rate, regular rhythm, normal heart sounds and intact distal pulses.  No murmur heard. Pulmonary/Chest: Effort normal and breath sounds normal. No respiratory distress.  Abdominal: Soft. There is tenderness. There is no rebound and no guarding.  Suprapubic tenderness, no guarding or rebound.  Foley catheter in place draining pink urine  Genitourinary:  Genitourinary Comments: No testicular tenderness. Small internal hemorrhoids noted.  No fecal impaction.  Prostate is nontender and non-boggy  Musculoskeletal: Normal range of motion. He exhibits no edema or tenderness.  Neurological: He is alert and oriented to person, place, and time. No cranial nerve deficit. He exhibits normal muscle tone. Coordination normal.  No ataxia on finger to nose bilaterally. No pronator drift. 5/5 strength throughout. CN 2-12 intact.Equal grip strength. Sensation intact.   Skin: Skin is warm.  Psychiatric: He has a normal mood and affect. His behavior is normal.   Nursing note and vitals reviewed.    ED Treatments / Results  Labs (all labs ordered are listed, but only abnormal results are displayed) Labs Reviewed  BASIC METABOLIC PANEL - Abnormal; Notable for the following components:      Result Value   Glucose, Bld 117 (*)    Creatinine, Ser 1.28 (*)    GFR calc non Af Amer 59 (*)    All other components within normal limits  URINALYSIS, ROUTINE W REFLEX MICROSCOPIC - Abnormal; Notable for the following components:   APPearance CLOUDY (*)    Hgb urine dipstick LARGE (*)    Protein, ur 100 (*)    Leukocytes, UA MODERATE (*)    RBC / HPF >50 (*)    WBC, UA >50 (*)    Bacteria, UA FEW (*)    All other components within normal limits  URINE CULTURE  CULTURE, BLOOD (ROUTINE X 2)  CULTURE, BLOOD (  ROUTINE X 2)  CBC WITH DIFFERENTIAL/PLATELET  I-STAT CG4 LACTIC ACID, ED  I-STAT CG4 LACTIC ACID, ED    EKG None  Radiology Ct Renal Stone Study  Result Date: 08/15/2017 CLINICAL DATA:  62 y/o M; TURP procedure 4/19. Patient presents with hematuria and unable to urinate normally. EXAM: CT ABDOMEN AND PELVIS WITHOUT CONTRAST TECHNIQUE: Multidetector CT imaging of the abdomen and pelvis was performed following the standard protocol without IV contrast. COMPARISON:  12/02/2010 CT abdomen and pelvis. FINDINGS: Lower chest: No acute abnormality. Hepatobiliary: No focal liver abnormality is seen. No gallstones, gallbladder wall thickening, or biliary dilatation. Pancreas: Unremarkable. No pancreatic ductal dilatation or surrounding inflammatory changes. Spleen: Normal in size without focal abnormality. Adrenals/Urinary Tract: Normal adrenal glands. Punctate bilateral kidney lower pole stones. No focal kidney lesion. No right hydronephrosis. Bladder collapsed around Foley catheter. Moderate left hydronephrosis and perinephric stranding with distal left ureter stone measuring up to 9 mm approximately 4 cm upstream to the ureterovesicular junction (series 5,  image 60 and series 2, image 73). Stomach/Bowel: Stomach is within normal limits. Appendix appears normal. No evidence of bowel wall thickening, distention, or inflammatory changes. Sigmoid diverticulosis without findings of acute diverticulitis. Vascular/Lymphatic: Aortic atherosclerosis. No enlarged abdominal or pelvic lymph nodes. Reproductive: Status post TURP.  Prostatic calcification. Other: No abdominal wall hernia or abnormality. No abdominopelvic ascites. Musculoskeletal: No fracture is seen. IMPRESSION: 1. Moderate left hydronephrosis and perinephric stranding with distal left ureter stone measuring up to 9 mm approximately 4 cm upstream to the ureterovesicular junction. 2. Bilateral punctate kidney lower pole nephrolithiasis. 3. Sigmoid diverticulosis without findings of diverticulitis. 4. Aortic atherosclerosis. Electronically Signed   By: Kristine Garbe M.D.   On: 08/15/2017 01:41    Procedures Procedures (including critical care time)  Medications Ordered in ED Medications  sodium chloride 0.9 % bolus 1,000 mL (has no administration in time range)  HYDROmorphone (DILAUDID) injection 1 mg (has no administration in time range)  ondansetron (ZOFRAN) injection 4 mg (has no administration in time range)  ketorolac (TORADOL) 30 MG/ML injection 30 mg (30 mg Intramuscular Given 08/14/17 2243)     Initial Impression / Assessment and Plan / ED Course  I have reviewed the triage vital signs and the nursing notes.  Pertinent labs & imaging results that were available during my care of the patient were reviewed by me and considered in my medical decision making (see chart for details).    Patient with urinary urgency, frequency, penis pain and bladder pain and difficulty with urination over the past week.  Catheter placed on arrival with minimal change in symptoms.   Fully irrigated and draining well.  Patient improved after receiving medications in the ED but still feels  uncomfortable.  Urinalysis concerning for infection and culture is sent.  IV Rocephin given.  Patient is afebrile, T-max was 99.6.  CT scan obtained given patient's ongoing pain and history of kidney stones. This does show 9 mm left ureteral stone.   this was discussed with Dr. Karsten Ro of urology.  He believes patient is not truly infected and urinalysis is probably still contaminated from previous TURP.  Patient is afebrile and nontoxic and nonseptic appearing. Lactate and WBC normal.  Dr. Karsten Ro recommends treating with antibiotics and culture urine and treating for kidney stone.  Recommend leaving Foley catheter. Advises patient return with fever, worsening pain, or vomiting.  Lactate normal. Blood and urine cultures sent.  Patient will be treated for kidney stone as well as UTI.  Patient  instructed to follow-up with his urologist.  Return to [facility] sooner if he develops fever, worsening pain, vomiting or other concerns. Final Clinical Impressions(s) / ED Diagnoses   Final diagnoses:  Ureteral colic  Urinary tract infection without hematuria, site unspecified    ED Discharge Orders    None       Khalia Gong, Annie Main, MD 08/15/17 [id number]

## 2017-08-14 NOTE — ED Triage Notes (Signed)
Pt had Turp procedure the end of April, states does not have a catheter right now. Pt states he has not been able to urinate normally since Thursday nigth.

## 2017-08-14 NOTE — ED Notes (Signed)
Pt moaning and very anxious asking if he might be blocked by blood clots Catheter is draining - spoke with Dr Earnest Conroy regarding pt anxiety and discomfiture and orders received Light dimmed  Urine spc at bedside

## 2017-08-14 NOTE — ED Notes (Signed)
Pt reports recent TURP Decreased urinary output since last pm Feels full and unable to void Bladder scan  Cude cath instilled with return of pink urine

## 2017-08-15 ENCOUNTER — Observation Stay
Admission: EM | Admit: 2017-08-15 | Discharge: 2017-08-16 | Disposition: A | Discharge status: Home / Self Care | Attending: Urology | Admitting: Urology

## 2017-08-15 ENCOUNTER — Emergency Department (HOSPITAL_COMMUNITY)

## 2017-08-15 ENCOUNTER — Other Ambulatory Visit: Payer: Self-pay

## 2017-08-15 ENCOUNTER — Encounter: Payer: Self-pay | Admitting: *Deleted

## 2017-08-15 DIAGNOSIS — E7800 Pure hypercholesterolemia, unspecified: Secondary | ICD-10-CM | POA: Diagnosis not present

## 2017-08-15 DIAGNOSIS — Z981 Arthrodesis status: Secondary | ICD-10-CM | POA: Diagnosis not present

## 2017-08-15 DIAGNOSIS — Z85828 Personal history of other malignant neoplasm of skin: Secondary | ICD-10-CM | POA: Insufficient documentation

## 2017-08-15 DIAGNOSIS — E039 Hypothyroidism, unspecified: Secondary | ICD-10-CM | POA: Insufficient documentation

## 2017-08-15 DIAGNOSIS — Z888 Allergy status to other drugs, medicaments and biological substances status: Secondary | ICD-10-CM | POA: Insufficient documentation

## 2017-08-15 DIAGNOSIS — Z87442 Personal history of urinary calculi: Secondary | ICD-10-CM | POA: Insufficient documentation

## 2017-08-15 DIAGNOSIS — Z79899 Other long term (current) drug therapy: Secondary | ICD-10-CM | POA: Insufficient documentation

## 2017-08-15 DIAGNOSIS — N201 Calculus of ureter: Secondary | ICD-10-CM | POA: Diagnosis present

## 2017-08-15 DIAGNOSIS — G4730 Sleep apnea, unspecified: Secondary | ICD-10-CM | POA: Insufficient documentation

## 2017-08-15 DIAGNOSIS — I2510 Atherosclerotic heart disease of native coronary artery without angina pectoris: Secondary | ICD-10-CM | POA: Diagnosis not present

## 2017-08-15 DIAGNOSIS — R109 Unspecified abdominal pain: Secondary | ICD-10-CM | POA: Insufficient documentation

## 2017-08-15 DIAGNOSIS — N132 Hydronephrosis with renal and ureteral calculous obstruction: Principal | ICD-10-CM | POA: Insufficient documentation

## 2017-08-15 DIAGNOSIS — M479 Spondylosis, unspecified: Secondary | ICD-10-CM | POA: Diagnosis not present

## 2017-08-15 DIAGNOSIS — N200 Calculus of kidney: Secondary | ICD-10-CM

## 2017-08-15 LAB — CBC WITH DIFFERENTIAL/PLATELET
BASOS ABS: 0.1 10*3/uL (ref 0–0.1)
BASOS ABS: 0.1 10*3/uL (ref 0.0–0.1)
BASOS PCT: 1 %
Basophils Relative: 1 %
EOS PCT: 4 %
Eosinophils Absolute: 0.3 10*3/uL (ref 0.0–0.7)
Eosinophils Absolute: 0.3 10*3/uL (ref 0–0.7)
Eosinophils Relative: 4 %
HEMATOCRIT: 40.8 % (ref 40.0–52.0)
HEMATOCRIT: 41.7 % (ref 39.0–52.0)
HEMOGLOBIN: 14.2 g/dL (ref 13.0–18.0)
HEMOGLOBIN: 14.1 g/dL (ref 13.0–17.0)
LYMPHS ABS: 1.4 10*3/uL (ref 0.7–4.0)
LYMPHS PCT: 14 %
LYMPHS PCT: 17 %
Lymphs Abs: 1.2 10*3/uL (ref 1.0–3.6)
MCH: 31.2 pg (ref 26.0–34.0)
MCH: 30.4 pg (ref 26.0–34.0)
MCHC: 33.8 g/dL (ref 30.0–36.0)
MCHC: 34.9 g/dL (ref 32.0–36.0)
MCV: 89.2 fL (ref 80.0–100.0)
MCV: 89.9 fL (ref 78.0–100.0)
MONOS PCT: 9 %
Monocytes Absolute: 0.7 10*3/uL (ref 0.1–1.0)
Monocytes Absolute: 0.8 10*3/uL (ref 0.2–1.0)
Monocytes Relative: 8 %
NEUTROS ABS: 5.5 10*3/uL (ref 1.7–7.7)
NEUTROS PCT: 72 %
NEUTROS PCT: 70 %
Neutro Abs: 6.3 10*3/uL (ref 1.4–6.5)
PLATELETS: 274 10*3/uL (ref 150–400)
Platelets: 253 10*3/uL (ref 150–440)
RBC: 4.57 MIL/uL (ref 4.40–5.90)
RBC: 4.64 MIL/uL (ref 4.22–5.81)
RDW: 12.8 % (ref 11.5–15.5)
RDW: 13.3 % (ref 11.5–14.5)
WBC: 8.7 10*3/uL (ref 3.8–10.6)
WBC: 7.9 10*3/uL (ref 4.0–10.5)

## 2017-08-15 LAB — URINALYSIS, ROUTINE W REFLEX MICROSCOPIC
Bilirubin Urine: NEGATIVE
Glucose, UA: NEGATIVE mg/dL
Ketones, ur: NEGATIVE mg/dL
Nitrite: NEGATIVE
PROTEIN: 100 mg/dL — AB
Specific Gravity, Urine: 1.018 (ref 1.005–1.030)
pH: 8 (ref 5.0–8.0)

## 2017-08-15 LAB — COMPREHENSIVE METABOLIC PANEL
ALBUMIN: 3.7 g/dL (ref 3.5–5.0)
ALK PHOS: 91 U/L (ref 38–126)
ALT: 18 U/L (ref 17–63)
AST: 22 U/L (ref 15–41)
Anion gap: 7 (ref 5–15)
BILIRUBIN TOTAL: 0.7 mg/dL (ref 0.3–1.2)
BUN: 21 mg/dL — AB (ref 6–20)
CALCIUM: 8.7 mg/dL — AB (ref 8.9–10.3)
CO2: 22 mmol/L (ref 22–32)
CREATININE: 1.66 mg/dL — AB (ref 0.61–1.24)
Chloride: 110 mmol/L (ref 101–111)
GFR calc Af Amer: 50 mL/min — ABNORMAL LOW (ref >60)
GFR calc non Af Amer: 43 mL/min — ABNORMAL LOW (ref >60)
GLUCOSE: 122 mg/dL — AB (ref 65–99)
Potassium: 3.9 mmol/L (ref 3.5–5.1)
Sodium: 139 mmol/L (ref 135–145)
TOTAL PROTEIN: 5.9 g/dL — AB (ref 6.5–8.1)

## 2017-08-15 LAB — BASIC METABOLIC PANEL
Anion gap: 9 (ref 5–15)
BUN: 20 mg/dL (ref 6–20)
CHLORIDE: 107 mmol/L (ref 101–111)
CO2: 22 mmol/L (ref 22–32)
Calcium: 9.3 mg/dL (ref 8.9–10.3)
Creatinine, Ser: 1.28 mg/dL — ABNORMAL HIGH (ref 0.61–1.24)
GFR calc Af Amer: >60 mL/min (ref >60)
GFR, EST NON AFRICAN AMERICAN: 59 mL/min — AB (ref >60)
GLUCOSE: 117 mg/dL — AB (ref 65–99)
POTASSIUM: 3.8 mmol/L (ref 3.5–5.1)
Sodium: 138 mmol/L (ref 135–145)

## 2017-08-15 LAB — LIPASE, BLOOD: Lipase: 36 U/L (ref 11–51)

## 2017-08-15 LAB — I-STAT CG4 LACTIC ACID, ED: LACTIC ACID, VENOUS: 0.81 mmol/L (ref 0.5–1.9)

## 2017-08-15 MED ORDER — BELLADONNA ALKALOIDS-OPIUM 16.2-60 MG RE SUPP: 1.0000 | Freq: Four times a day (QID) | RECTAL | Status: DC | PRN

## 2017-08-15 MED ORDER — MORPHINE SULFATE (PF) 2 MG/ML IV SOLN
2.0000 mg | INTRAVENOUS | Status: DC | PRN
  Administered 2017-08-16: 2 mg via INTRAVENOUS
  Filled 2017-08-15: qty 1

## 2017-08-15 MED ORDER — SODIUM CHLORIDE 0.9 % IV SOLN
1.0000 g | INTRAVENOUS | Status: DC
  Administered 2017-08-15: 1 g via INTRAVENOUS
  Filled 2017-08-15 (×2): qty 10

## 2017-08-15 MED ORDER — FENTANYL CITRATE (PF) 100 MCG/2ML IJ SOLN
INTRAMUSCULAR | Status: AC
  Administered 2017-08-15: 50 ug via INTRAVENOUS
  Filled 2017-08-15: qty 2

## 2017-08-15 MED ORDER — FENTANYL CITRATE (PF) 100 MCG/2ML IJ SOLN
50.0000 ug | Freq: Once | INTRAMUSCULAR | Status: AC
  Administered 2017-08-15: 50 ug via INTRAVENOUS

## 2017-08-15 MED ORDER — DORZOLAMIDE HCL 2 % OP SOLN
1.0000 [drp] | Freq: Two times a day (BID) | OPHTHALMIC | Status: DC
  Administered 2017-08-16: 10:00:00 1 [drp] via OPHTHALMIC
  Filled 2017-08-15: qty 10

## 2017-08-15 MED ORDER — ONDANSETRON 4 MG PO TBDP: 4.0000 mg | ORAL_TABLET | Freq: Three times a day (TID) | ORAL | 0 refills | Status: DC | PRN

## 2017-08-15 MED ORDER — OXYCODONE-ACETAMINOPHEN 5-325 MG PO TABS: 1.0000 | ORAL_TABLET | ORAL | 0 refills | Status: DC | PRN

## 2017-08-15 MED ORDER — LEVOTHYROXINE SODIUM 100 MCG PO TABS: 200.0000 ug | ORAL_TABLET | Freq: Every day | ORAL | Status: DC

## 2017-08-15 MED ORDER — LORATADINE 10 MG PO TABS: 10.0000 mg | ORAL_TABLET | Freq: Every day | ORAL | Status: DC

## 2017-08-15 MED ORDER — SODIUM CHLORIDE 0.9 % IV BOLUS
500.0000 mL | Freq: Once | INTRAVENOUS | Status: AC
  Administered 2017-08-15: 500 mL via INTRAVENOUS

## 2017-08-15 MED ORDER — DOCUSATE SODIUM 100 MG PO CAPS: 100.0000 mg | ORAL_CAPSULE | Freq: Two times a day (BID) | ORAL | Status: DC

## 2017-08-15 MED ORDER — TAMSULOSIN HCL 0.4 MG PO CAPS: 0.4000 mg | ORAL_CAPSULE | Freq: Two times a day (BID) | ORAL | Status: DC

## 2017-08-15 MED ORDER — OXYCODONE-ACETAMINOPHEN 5-325 MG PO TABS: 1.0000 | ORAL_TABLET | ORAL | Status: DC | PRN

## 2017-08-15 MED ORDER — DIPHENHYDRAMINE HCL 12.5 MG/5ML PO ELIX
12.5000 mg | ORAL_SOLUTION | Freq: Four times a day (QID) | ORAL | Status: DC | PRN
  Filled 2017-08-15: qty 5

## 2017-08-15 MED ORDER — ONDANSETRON HCL 4 MG/2ML IJ SOLN
4.0000 mg | Freq: Once | INTRAMUSCULAR | Status: AC
  Administered 2017-08-15: 4 mg via INTRAVENOUS
  Filled 2017-08-15: qty 2

## 2017-08-15 MED ORDER — HYDROMORPHONE HCL 1 MG/ML IJ SOLN
0.5000 mg | INTRAMUSCULAR | Status: AC
  Administered 2017-08-15: 0.5 mg via INTRAVENOUS
  Filled 2017-08-15: qty 1

## 2017-08-15 MED ORDER — IBUPROFEN 800 MG PO TABS: 800.0000 mg | ORAL_TABLET | Freq: Three times a day (TID) | ORAL | 0 refills | Status: DC | PRN

## 2017-08-15 MED ORDER — OXYBUTYNIN CHLORIDE 5 MG PO TABS: 5.0000 mg | ORAL_TABLET | Freq: Three times a day (TID) | ORAL | Status: DC | PRN

## 2017-08-15 MED ORDER — DIPHENHYDRAMINE HCL 50 MG/ML IJ SOLN
12.5000 mg | Freq: Four times a day (QID) | INTRAMUSCULAR | Status: DC | PRN
  Filled 2017-08-15: qty 0.25

## 2017-08-15 MED ORDER — ACETAMINOPHEN 325 MG PO TABS: 650.0000 mg | ORAL_TABLET | ORAL | Status: DC | PRN

## 2017-08-15 MED ORDER — ONDANSETRON HCL 4 MG/2ML IJ SOLN: 4.0000 mg | INTRAMUSCULAR | Status: DC | PRN

## 2017-08-15 MED ORDER — CEPHALEXIN 500 MG PO CAPS: 500.0000 mg | ORAL_CAPSULE | Freq: Four times a day (QID) | ORAL | 0 refills | Status: DC

## 2017-08-15 MED ORDER — SODIUM CHLORIDE 0.9 % IV SOLN
INTRAVENOUS | Status: DC
  Administered 2017-08-15 – 2017-08-16 (×2): via INTRAVENOUS

## 2017-08-15 MED ORDER — SODIUM CHLORIDE 0.9 % IV SOLN
1.0000 g | Freq: Once | INTRAVENOUS | Status: AC
  Administered 2017-08-15: 1 g via INTRAVENOUS
  Filled 2017-08-15: qty 10

## 2017-08-15 NOTE — ED Provider Notes (Signed)
[facility] Emergency Department Provider Note   ____________________________________________   First MD Initiated Contact with Patient 08/15/17 2009     (approximate)  I have reviewed the triage vital signs and the nursing notes.   HISTORY  Chief Complaint Urinary Retention   HPI DEWAIN PLATZ is a 62 y.o. male presents for evaluation of severe pain in his "perineum" rectal region and left lower pelvis  Patient reports since about Monday has been having severe episodes of pain in the same area, also occasionally having trouble urinating but then also having times where he is able to urinate normally.  A lot of urinary urgency.  Seen yesterday at [facility] emergency room, he was given prescription for antibiotics and Percocet.  Reports his pain is ongoing severe located in the left lower pelvis and perineum.  Some nausea mild much vomiting.  No fevers or chills.  Urinary catheter has been draining urine and is emptied the bag several times a day.  He does not feel like his bladder is blocked  Currently reports pain is about 9-10 out of 10 very sharp pain in the left lower pelvis and rectal region that radiates out towards his groin.  TURP about 1 month ago.   Past Medical History:  Diagnosis Date  . Arthritis    NECK  . BPH (benign prostatic hyperplasia)   . Cancer (HCC)    SKIN CANCER-BASAL  . Coronary artery disease   . History of kidney stones   . Hypercholesterolemia   . Hypothyroidism   . Renal disorder    kidney stones  . Sleep apnea    USES CPAP  . Thyroid disease     Patient Active Problem List   Diagnosis Date Noted  . Left ureteral stone 08/15/2017  . S/P TURP 07/20/2017  . Benign prostatic hyperplasia with incomplete bladder emptying 06/17/2017  . Nephrolithiasis 06/17/2017    Past Surgical History:  Procedure Laterality Date  . ANKLE SURGERY    . HERNIA REPAIR    . KNEE SURGERY    . NECK SURGERY    . SHOULDER  SURGERY    . TRANSURETHRAL RESECTION OF PROSTATE N/A 07/20/2017   Procedure: TRANSURETHRAL RESECTION OF THE PROSTATE (TURP);  Surgeon: Abbie Sons, MD;  Location: [facility] ORS;  Service: Urology;  Laterality: N/A;  . VASECTOMY      Prior to Admission medications   Medication Sig Start Date End Date Taking? Authorizing Provider  acetaminophen (TYLENOL) 500 MG tablet Take 500 mg by mouth daily as needed for moderate pain or headache.   Yes [provider]  atorvastatin (LIPITOR) 80 MG tablet Take 80 mg by mouth every evening.    Yes [provider]  cephALEXin (KEFLEX) 500 MG capsule Take 1 capsule (500 mg total) by mouth 4 (four) times daily. 08/15/17  Yes Rancour, Annie Main, MD  cetirizine (ZYRTEC) 10 MG tablet Take 10 mg by mouth daily as needed for allergies.   Yes [provider]  cholecalciferol (VITAMIN D) 1000 units tablet Take 1,000 Units by mouth daily.   Yes [provider]  dorzolamide (TRUSOPT) 2 % ophthalmic solution Place 1 drop into both eyes 2 (two) times daily. 07/19/17  Yes [provider]  ibuprofen (ADVIL,MOTRIN) 800 MG tablet Take 1 tablet (800 mg total) by mouth every 8 (eight) hours as needed for moderate pain. 08/15/17  Yes Rancour, Annie Main, MD  levothyroxine (SYNTHROID, LEVOTHROID) 200 MCG tablet Take 200 mcg by mouth daily before breakfast.  Yes [provider]  mirabegron ER (MYRBETRIQ) 50 MG TB24 tablet Take 50 mg by mouth daily.   Yes [provider]  ondansetron (ZOFRAN ODT) 4 MG disintegrating tablet Take 1 tablet (4 mg total) by mouth every 8 (eight) hours as needed for nausea or vomiting. 08/15/17  Yes Rancour, Annie Main, MD  oxyCODONE-acetaminophen (PERCOCET/ROXICET) 5-325 MG tablet Take 1 tablet by mouth every 4 (four) hours as needed for severe pain. 08/15/17  Yes Rancour, Annie Main, MD  tamsulosin (FLOMAX) 0.4 MG CAPS capsule Take 0.4 mg by mouth 2 (two) times daily.    Yes [provider]     Allergies Cortisone; Neurontin [gabapentin]; and Other  Family History  Problem Relation Age of Onset  . Prostate cancer Neg Hx   . Bladder Cancer Neg Hx   . Kidney cancer Neg Hx     Social History Social History   Tobacco Use  . Smoking status: Never Smoker  . Smokeless tobacco: Never Used  Substance Use Topics  . Alcohol use: Yes    Comment: RARE  . Drug use: No    Review of Systems Constitutional: No fever/chills Eyes: No visual changes. ENT: No sore throat. Cardiovascular: Denies chest pain. Respiratory: Denies shortness of breath. Gastrointestinal: No abdominal pain.  No nausea, no vomiting.  No diarrhea.  No constipation. Genitourinary: Negative for dysuria. Musculoskeletal: Negative for back pain. Skin: Negative for rash. Neurological: Negative for headaches, focal weakness or numbness.    ____________________________________________   PHYSICAL EXAM:  VITAL SIGNS: ED Triage Vitals  Enc Vitals Group     BP 08/15/17 1928 138/70     Pulse Rate 08/15/17 1928 (!) 116     Resp 08/15/17 1928 (!) 24     Temp 08/15/17 1928 97.6 F (36.4 C)     Temp Source 08/15/17 1928 Oral     SpO2 08/15/17 1928 97 %     Weight --      Height --      Head Circumference --      Peak Flow --      Pain Score 08/15/17 1947 9     Pain Loc --      Pain Edu? --      Excl. in [place]? --     Constitutional: Alert and oriented.  Appears in pain.  Slightly diaphoretic reports severe pain in the left pelvis. Eyes: Conjunctivae are normal. Head: Atraumatic. Nose: No congestion/rhinnorhea. Mouth/Throat: Mucous membranes are moist. Neck: No stridor.   Cardiovascular: Slightly tachycardic rate, regular rhythm. Grossly normal heart sounds.  Good peripheral circulation. Respiratory: Normal respiratory effort.  No retractions. Lungs CTAB. Gastrointestinal: Soft and nontender except for report of pain in the left lower pelvis.  Genitourinary exam normal circumcised penis.  Foley  catheter in place draining slightly reddish but clear urine.  Urine bag is almost full.. No distention.  No testicular pain or scrotal edema. Musculoskeletal: No lower extremity tenderness nor edema. Neurologic:  Normal speech and language. No gross focal neurologic deficits are appreciated.  Skin:  Skin is warm, dry and intact. No rash noted. Psychiatric: Mood and affect are normal. Speech and behavior are normal.  ____________________________________________   LABS (all labs ordered are listed, but only abnormal results are displayed)  Labs Reviewed  COMPREHENSIVE METABOLIC PANEL - Abnormal; Notable for the following components:      Result Value   Glucose, Bld 122 (*)    BUN 21 (*)    Creatinine, Ser 1.66 (*)    Calcium  8.7 (*)    Total Protein 5.9 (*)    GFR calc non Af Amer 43 (*)    GFR calc Af Amer 50 (*)    All other components within normal limits  CBC WITH DIFFERENTIAL/PLATELET  LIPASE, BLOOD  HIV ANTIBODY (ROUTINE TESTING)   ____________________________________________  EKG   ____________________________________________  RADIOLOGY    Reviewed CT scan from yesterday.   have left-sided hydronephrosis with a 9 mm stone ____________________________________________   PROCEDURES  Procedure(s) performed: None  Procedures  Critical Care performed: No  ____________________________________________   INITIAL IMPRESSION / ASSESSMENT AND PLAN / ED COURSE  Pertinent labs & imaging results that were available during my care of the patient were reviewed by me and considered in my medical decision making (see chart for details).  Differential diagnosis includes but is not limited to, abdominal perforation, aortic dissection, cholecystitis, appendicitis, diverticulitis, colitis, esophagitis/gastritis, kidney stone, pyelonephritis, urinary tract infection, aortic aneurysm. All are considered in decision and treatment plan. Based upon the patient's presentation and  risk factors,.  The patient is having ongoing fairly intractable discomfort and pain from his kidney stone.  His clinical history seems to suggest pain primarily involving the left lower pelvis radiating to the groin consistent with likely kidney stone on the left.  No evidence of infection at this point, will recheck white blood cell count.  Had urine and blood cultures drawn yesterday that are so far no growth to date.  He is currently on antibiotics not complaining of any infectious symptoms.  ----------------------------------------- 8:49 PM on 08/15/2017 -----------------------------------------  Patient reports moderate relief after fentanyl and Dilaudid.  I have spoken to Dr. Erlene Quan who will be coming to see the patient in consultation for further treatment recommendations this evening.      Patient has been admitted to urology by Dr. Erlene Quan.  ____________________________________________   FINAL CLINICAL IMPRESSION(S) / ED DIAGNOSES  Final diagnoses:  Kidney stone on left side  Ureterolithiasis      NEW MEDICATIONS STARTED DURING THIS VISIT:  Current Discharge Medication List       Note:  This document was prepared using Dragon voice recognition software and may include unintentional dictation errors.     Delman Kitten, MD 08/16/17 0110

## 2017-08-15 NOTE — Discharge Instructions (Signed)
You have a kidney stone that is working its way down to your bladder on the left side.  Keep the catheter in place until you see the urologist.  Take the antibiotics for a possible urinary tract infection as well.  Follow-up with your urologist as soon as possible.  If you develop fever, worsening pain, vomiting, or any other concerns return to the ED ideally [facility] as urology is present there.

## 2017-08-15 NOTE — ED Notes (Signed)
Irrigated foley, good return, patient tolerated well.

## 2017-08-15 NOTE — ED Notes (Signed)
ED TO INPATIENT HANDOFF REPORT  Name/Age/Gender Adrian Marshall 62 y.o. male  Code Status Code Status History    Date Active Date Inactive Code Status Order ID Comments User Context   07/20/2017 1603 07/21/2017 1821 Full Code [id number]  Abbie Sons, MD Inpatient      Home/SNF/Other Home  Chief Complaint Flank pain  Level of Care/Admitting Diagnosis ED Disposition    ED Disposition Condition [place]: [facility] [[id number]]  Level of Care: Med-Surg [16]  Diagnosis: Left ureteral stone [1660630]  Admitting Physician: Hollice Espy [[id number]]  Attending Physician: Hollice Espy [[id number]]  PT Class (Do Not Modify): Observation [104]  PT Acc Code (Do Not Modify): Observation [10022]       Medical History Past Medical History:  Diagnosis Date  . Arthritis    NECK  . BPH (benign prostatic hyperplasia)   . Cancer (HCC)    SKIN CANCER-BASAL  . Coronary artery disease   . History of kidney stones   . Hypercholesterolemia   . Hypothyroidism   . Renal disorder    kidney stones  . Sleep apnea    USES CPAP  . Thyroid disease     Allergies Allergies  Allergen Reactions  . Cortisone     inj to shoulder - paralysis  . Neurontin [Gabapentin]     Altered mental state  . Other     Steroids - paralysis     IV Location/Drains/Wounds Patient Lines/Drains/Airways Status   Active Line/Drains/Airways    Name:   Placement date:   Placement time:   Site:   Days:   Peripheral IV 08/15/17 Left Hand   08/15/17    1934    Hand   less than 1   Urethral Catheter Jerrye Bushy, RN [place] 16 Fr.   08/14/17    2252    [place]   1          Labs/Imaging Results for orders placed or performed during the hospital encounter of 08/15/17 (from the past 48 hour(s))  CBC with Differential     Status: None   Collection Time: 08/15/17  8:36 PM  Result Value Ref Range   WBC 8.7 3.8 - 10.6 K/uL   RBC 4.57 4.40 - 5.90 MIL/uL   Hemoglobin  14.2 13.0 - 18.0 g/dL   HCT 40.8 40.0 - 52.0 %   MCV 89.2 80.0 - 100.0 fL   MCH 31.2 26.0 - 34.0 pg   MCHC 34.9 32.0 - 36.0 g/dL   RDW 13.3 11.5 - 14.5 %   Platelets 253 150 - 440 K/uL   Neutrophils Relative % 72 %   Neutro Abs 6.3 1.4 - 6.5 K/uL   Lymphocytes Relative 14 %   Lymphs Abs 1.2 1.0 - 3.6 K/uL   Monocytes Relative 9 %   Monocytes Absolute 0.8 0.2 - 1.0 K/uL   Eosinophils Relative 4 %   Eosinophils Absolute 0.3 0 - 0.7 K/uL   Basophils Relative 1 %   Basophils Absolute 0.1 0 - 0.1 K/uL    Comment: Performed at [facility], [address] [place], [place] [address]  Comprehensive metabolic panel     Status: Abnormal   Collection Time: 08/15/17  8:36 PM  Result Value Ref Range   Sodium 139 135 - 145 mmol/L   Potassium 3.9 3.5 - 5.1 mmol/L   Chloride 110 101 - 111 mmol/L   CO2 22 22 - 32 mmol/L   Glucose, Bld  122 (H) 65 - 99 mg/dL   BUN 21 (H) 6 - 20 mg/dL   Creatinine, Ser 1.66 (H) 0.61 - 1.24 mg/dL   Calcium 8.7 (L) 8.9 - 10.3 mg/dL   Total Protein 5.9 (L) 6.5 - 8.1 g/dL   Albumin 3.7 3.5 - 5.0 g/dL   AST 22 15 - 41 U/L   ALT 18 17 - 63 U/L   Alkaline Phosphatase 91 38 - 126 U/L   Total Bilirubin 0.7 0.3 - 1.2 mg/dL   GFR calc non Af Amer 43 (L) >60 mL/min   GFR calc Af Amer 50 (L) >60 mL/min    Comment: (NOTE) The eGFR has been calculated using the CKD EPI equation. This calculation has not been validated in all clinical situations. eGFR's persistently <60 mL/min signify possible Chronic Kidney Disease.    Anion gap 7 5 - 15    Comment: Performed at [facility], [address] [place], [place] [address]  Lipase, blood     Status: None   Collection Time: 08/15/17  8:36 PM  Result Value Ref Range   Lipase 36 11 - 51 U/L    Comment: Performed at [facility], [address], [place], [place] [address]   Ct Renal Stone Study  Result Date: 08/15/2017 CLINICAL DATA:  62 y/o M; TURP procedure 4/19. Patient presents with  hematuria and unable to urinate normally. EXAM: CT ABDOMEN AND PELVIS WITHOUT CONTRAST TECHNIQUE: Multidetector CT imaging of the abdomen and pelvis was performed following the standard protocol without IV contrast. COMPARISON:  12/02/2010 CT abdomen and pelvis. FINDINGS: Lower chest: No acute abnormality. Hepatobiliary: No focal liver abnormality is seen. No gallstones, gallbladder wall thickening, or biliary dilatation. Pancreas: Unremarkable. No pancreatic ductal dilatation or surrounding inflammatory changes. Spleen: Normal in size without focal abnormality. Adrenals/Urinary Tract: Normal adrenal glands. Punctate bilateral kidney lower pole stones. No focal kidney lesion. No right hydronephrosis. Bladder collapsed around Foley catheter. Moderate left hydronephrosis and perinephric stranding with distal left ureter stone measuring up to 9 mm approximately 4 cm upstream to the ureterovesicular junction (series 5, image 60 and series 2, image 73). Stomach/Bowel: Stomach is within normal limits. Appendix appears normal. No evidence of bowel wall thickening, distention, or inflammatory changes. Sigmoid diverticulosis without findings of acute diverticulitis. Vascular/Lymphatic: Aortic atherosclerosis. No enlarged abdominal or pelvic lymph nodes. Reproductive: Status post TURP.  Prostatic calcification. Other: No abdominal wall hernia or abnormality. No abdominopelvic ascites. Musculoskeletal: No fracture is seen. IMPRESSION: 1. Moderate left hydronephrosis and perinephric stranding with distal left ureter stone measuring up to 9 mm approximately 4 cm upstream to the ureterovesicular junction. 2. Bilateral punctate kidney lower pole nephrolithiasis. 3. Sigmoid diverticulosis without findings of diverticulitis. 4. Aortic atherosclerosis. Electronically Signed   By: Kristine Garbe M.D.   On: 08/15/2017 01:41    Pending Labs Unresulted Labs (From admission, onward)   Start     Ordered   Signed and Held   HIV antibody (Routine Testing)  Once,   R     Signed and Held      Vitals/Pain Today's Vitals   08/15/17 2055 08/15/17 2100 08/15/17 2130 08/15/17 2200  BP:  122/69 (!) 142/73 117/62  Pulse:  77 79 82  Resp:      Temp:      TempSrc:      SpO2:  96% 98% 95%  PainSc: 5        Isolation Precautions No active isolations  Medications Medications  fentaNYL (SUBLIMAZE) injection  50 mcg (50 mcg Intravenous Given 08/15/17 1944)  sodium chloride 0.9 % bolus 500 mL (0 mLs Intravenous Stopped 08/15/17 2033)  HYDROmorphone (DILAUDID) injection 0.5 mg (0.5 mg Intravenous Given 08/15/17 2033)  ondansetron (ZOFRAN) injection 4 mg (4 mg Intravenous Given 08/15/17 2039)    Mobility walks

## 2017-08-15 NOTE — ED Triage Notes (Signed)
Patient reports seen last PM at ED in [place] and diagnosed with kidney stone and had indwelling foley placed.  Patient reports today with decreased urine output and increased pain (even with meds prescribed last pm).  Patient is s/p TURP on 07/20/17.

## 2017-08-15 NOTE — H&P (Signed)
Urology Consult  I have been asked to see the patient by Dr. Jacqualine Code, for evaluation and management of left ureteral stone.  Chief Complaint: left flank pain  History of Present Illness: Adrian Marshall is a 62 y.o. year old male with a personal history of BPH status post recent TURP 07/20/2017  by Dr. Bernardo Heater who presents the emergency room with a 9 mm left distal ureteral calculus with proximal hydronephrosis.  He reports that over the past month, he has had some issues voiding, initially with postoperative urinary retention which since resolved.  Starting on Wednesday, he developed sudden onset worsening urinary symptoms, urgency frequency and difficulty voiding.  These were felt to be related to bladder spasms.  She also has some left lower abdominal pain.  Ultimately, his pain worsened significantly and he was seen and evaluated yesterday at [facility].  He underwent further evaluation of the time including CBC which showed no evidence of leukocytosis, urinalysis which was consistent with previous TURP with culture pending, and a CT scan indicating a left large distal obstructing ureteral calculus.  At the time, he is also having difficulty voiding and a Foley catheter is in place with only 200 cc of output.  Over the course of the day today, his pain has acutely worsened this along with his irritative symptoms.  In the emergency room today, he is required multiple rounds of IV pain medication including Dilaudid.  He did have some nausea but no vomiting.  He had a low-grade temp of 99 but no true fevers.  He last ate at 5 PM and has been drinking fluids.  Past Medical History:  Diagnosis Date  . Arthritis    NECK  . BPH (benign prostatic hyperplasia)   . Cancer (HCC)    SKIN CANCER-BASAL  . Coronary artery disease   . History of kidney stones   . Hypercholesterolemia   . Hypothyroidism   . Renal disorder    kidney stones  . Sleep apnea    USES CPAP  .  Thyroid disease     Past Surgical History:  Procedure Laterality Date  . ANKLE SURGERY    . HERNIA REPAIR    . KNEE SURGERY    . NECK SURGERY    . SHOULDER SURGERY    . TRANSURETHRAL RESECTION OF PROSTATE N/A 07/20/2017   Procedure: TRANSURETHRAL RESECTION OF THE PROSTATE (TURP);  Surgeon: Abbie Sons, MD;  Location: [facility] ORS;  Service: Urology;  Laterality: N/A;  . VASECTOMY      Home Medications:  No outpatient medications have been marked as taking for the 08/15/17 encounter [facility] Encounter).    Allergies:  Allergies  Allergen Reactions  . Cortisone     inj to shoulder - paralysis  . Neurontin [Gabapentin]     Altered mental state  . Other     Steroids - paralysis     Family History  Problem Relation Age of Onset  . Prostate cancer Neg Hx   . Bladder Cancer Neg Hx   . Kidney cancer Neg Hx     Social History:  reports that he has never smoked. He has never used smokeless tobacco. He reports that he drinks alcohol. He reports that he does not use drugs.  ROS: A complete review of systems was performed.  All systems are negative except for pertinent findings as noted.  Physical Exam:  Vital signs in last 24 hours: Temp:  [97.6 F (36.4 C)-99.6 F (37.6  C)] 97.6 F (36.4 C) (05/04 1928) Pulse Rate:  [72-126] 79 (05/04 2130) Resp:  [18-24] 20 (05/04 2036) BP: (122-142)/(67-90) 142/73 (05/04 2130) SpO2:  [95 %-100 %] 98 % (05/04 2130) Weight:  [189 lb (85.7 kg)] 189 lb (85.7 kg) (05/04 0243) Constitutional:  Alert and oriented, No acute distress.  Family at bedside. HEENT: [place] AT, moist mucus membranes.  Trachea midline, no masses Cardiovascular: Regular rate and rhythm, no clubbing, cyanosis, or edema. Respiratory: Normal respiratory effort, lungs clear bilaterally GI: Abdomen is soft, mildly tender in suprapubic area as well as left lower quadrant. GU: No CVA tenderness.  Foley catheter in place draining pink-tinged urine. Skin: No rashes, bruises or  suspicious lesions Neurologic: Grossly intact, no focal deficits, moving all 4 extremities Psychiatric: Normal mood and affect   Laboratory Data:  Recent Labs    08/15/17 0008 08/15/17 2036  WBC 7.9 8.7  HGB 14.1 14.2  HCT 41.7 40.8   Recent Labs    08/15/17 0008 08/15/17 2036  NA 138 139  K 3.8 3.9  CL 107 110  CO2 22 22  GLUCOSE 117* 122*  BUN 20 21*  CREATININE 1.28* 1.66*  CALCIUM 9.3 8.7*   No results for input(s): LABPT, INR in the last 72 hours. No results for input(s): LABURIN in the last 72 hours. Results for orders placed or performed during the hospital encounter of 08/14/17  Blood culture (routine x 2)     Status: None (Preliminary result)   Collection Time: 08/15/17  3:25 AM  Result Value Ref Range Status   Specimen Description LEFT ANTECUBITAL  Final   Special Requests   Final    BOTTLES DRAWN AEROBIC AND ANAEROBIC Blood Culture adequate volume   Culture   Final    NO GROWTH < 12 HOURS Performed at [facility], [address] [place], [place] [address]    Report Status PENDING  Incomplete  Blood culture (routine x 2)     Status: None (Preliminary result)   Collection Time: 08/15/17  3:32 AM  Result Value Ref Range Status   Specimen Description RIGHT ANTECUBITAL  Final   Special Requests   Final    BOTTLES DRAWN AEROBIC AND ANAEROBIC Blood Culture adequate volume   Culture   Final    NO GROWTH < 12 HOURS Performed at [facility], [address], [place], [place] [address]    Report Status PENDING  Incomplete   Component     Latest Ref Rng & Units 08/14/2017  Color, Urine     YELLOW YELLOW  Appearance     CLEAR CLOUDY (A)  Specific Gravity, Urine     1.005 - 1.030 1.018  pH     5.0 - 8.0 8.0  Glucose     NEGATIVE mg/dL NEGATIVE  Hgb urine dipstick     NEGATIVE LARGE (A)  Bilirubin Urine     NEGATIVE NEGATIVE  Ketones, ur     NEGATIVE mg/dL NEGATIVE  Protein     NEGATIVE mg/dL 100 (A)  Nitrite     NEGATIVE NEGATIVE  Leukocytes, UA      NEGATIVE MODERATE (A)  RBC / HPF     0 - 5 RBC/hpf >50 (H)  WBC, UA     0 - 5 WBC/hpf >50 (H)  Bacteria, UA     NONE SEEN FEW (A)  Squamous Epithelial / LPF     NONE SEEN   Mucus      PRESENT  Budding Yeast  Uric Acid Crys, UA        WBC Clumps      PRESENT   Radiologic Imaging: Ct Renal Stone Study  Result Date: 08/15/2017 CLINICAL DATA:  62 y/o M; TURP procedure 4/19. Patient presents with hematuria and unable to urinate normally. EXAM: CT ABDOMEN AND PELVIS WITHOUT CONTRAST TECHNIQUE: Multidetector CT imaging of the abdomen and pelvis was performed following the standard protocol without IV contrast. COMPARISON:  12/02/2010 CT abdomen and pelvis. FINDINGS: Lower chest: No acute abnormality. Hepatobiliary: No focal liver abnormality is seen. No gallstones, gallbladder wall thickening, or biliary dilatation. Pancreas: Unremarkable. No pancreatic ductal dilatation or surrounding inflammatory changes. Spleen: Normal in size without focal abnormality. Adrenals/Urinary Tract: Normal adrenal glands. Punctate bilateral kidney lower pole stones. No focal kidney lesion. No right hydronephrosis. Bladder collapsed around Foley catheter. Moderate left hydronephrosis and perinephric stranding with distal left ureter stone measuring up to 9 mm approximately 4 cm upstream to the ureterovesicular junction (series 5, image 60 and series 2, image 73). Stomach/Bowel: Stomach is within normal limits. Appendix appears normal. No evidence of bowel wall thickening, distention, or inflammatory changes. Sigmoid diverticulosis without findings of acute diverticulitis. Vascular/Lymphatic: Aortic atherosclerosis. No enlarged abdominal or pelvic lymph nodes. Reproductive: Status post TURP.  Prostatic calcification. Other: No abdominal wall hernia or abnormality. No abdominopelvic ascites. Musculoskeletal: No fracture is seen. IMPRESSION: 1. Moderate left hydronephrosis and perinephric stranding with distal left  ureter stone measuring up to 9 mm approximately 4 cm upstream to the ureterovesicular junction. 2. Bilateral punctate kidney lower pole nephrolithiasis. 3. Sigmoid diverticulosis without findings of diverticulitis. 4. Aortic atherosclerosis. Electronically Signed   By: Kristine Garbe M.D.   On: 08/15/2017 01:41   CT scan was reviewed personally today as well as with the patient and his family at the bedside.  Impression/plan: 62 year old male presenting with an 9 mm obstructing left distal ureteral calculus and refractory poorly controlled pain.  Clinically, there is no obvious sign of infection as his urine is most consistent with post TURP (anticipated leukocytes/red blood cells postop) without evidence of leukocytosis or other signs or symptoms.    1.  Left ureteral calculus- given this pain is poorly controlled on the size of the stone, I recommended admission overnight for pain control followed by possible left ureteroscopy, laser lithotripsy and stent placement tomorrow.  We will go ahead and start him on IV antibiotics as a precaution although I do not suspect a true infection.  We discussed the risk of ureteroscopy including risk of bleeding, infection, damage to the ureter, stricture, need for multiple procedures, amongst others.  We also discussed the need for possible ureteral stent.  He also understands that intraoperatively, there is any signs of infection, we will abort the procedure and place a stent only.  He is agreeable this plan.  He was site marked this evening in order for consent was placed.  NPO at MN.  2.  Possible urinary retention- Foley catheter placed yesterday for presumed urinary retention although only a 200 cc and is by the time of catheter placement.  We will plan for voiding trial tomorrow following surgery.  Continue Flomax.  3.  Acute kidney injury-likely multifactorial secondary to urinary obstruction and poor p.o. intake.  IV hydration.   08/15/2017, 9:43  PM  Hollice Espy,  MD

## 2017-08-15 NOTE — ED Notes (Signed)
ED Provider at bedside. 

## 2017-08-15 NOTE — ED Notes (Signed)
Attempted report, RN cannot take report, call back number given.

## 2017-08-16 ENCOUNTER — Encounter: Payer: Self-pay | Admitting: Anesthesiology

## 2017-08-16 ENCOUNTER — Encounter
Admission: EM | Disposition: A | Discharge status: Home / Self Care | Payer: Self-pay | Source: Home / Self Care | Attending: Emergency Medicine

## 2017-08-16 ENCOUNTER — Observation Stay: Admitting: Anesthesiology

## 2017-08-16 DIAGNOSIS — N200 Calculus of kidney: Secondary | ICD-10-CM | POA: Diagnosis not present

## 2017-08-16 DIAGNOSIS — N201 Calculus of ureter: Secondary | ICD-10-CM | POA: Diagnosis not present

## 2017-08-16 HISTORY — PX: CYSTOSCOPY WITH RETROGRADE PYELOGRAM, URETEROSCOPY AND STENT PLACEMENT: SHX5789

## 2017-08-16 HISTORY — PX: URETEROSCOPY WITH HOLMIUM LASER LITHOTRIPSY: SHX6645

## 2017-08-16 LAB — URINE CULTURE: Culture: NO GROWTH

## 2017-08-16 SURGERY — CYSTOURETEROSCOPY, WITH RETROGRADE PYELOGRAM AND STENT INSERTION
Anesthesia: General | Site: Ureter | Laterality: Left | Wound class: Clean Contaminated

## 2017-08-16 MED ORDER — SODIUM CHLORIDE 0.9 % IV SOLN
INTRAVENOUS | Status: DC | PRN
  Administered 2017-08-16: 30 ug/min via INTRAVENOUS

## 2017-08-16 MED ORDER — ACETAMINOPHEN 10 MG/ML IV SOLN
INTRAVENOUS | Status: DC | PRN
  Administered 2017-08-16: 1000 mg via INTRAVENOUS

## 2017-08-16 MED ORDER — GLYCOPYRROLATE 0.2 MG/ML IJ SOLN
INTRAMUSCULAR | Status: DC | PRN
  Administered 2017-08-16: 0.2 mg via INTRAVENOUS

## 2017-08-16 MED ORDER — PHENYLEPHRINE HCL 10 MG/ML IJ SOLN
INTRAMUSCULAR | Status: DC | PRN
  Administered 2017-08-16 (×3): 100 ug via INTRAVENOUS

## 2017-08-16 MED ORDER — ACETAMINOPHEN 10 MG/ML IV SOLN
INTRAVENOUS | Status: AC
  Filled 2017-08-16: qty 100

## 2017-08-16 MED ORDER — FENTANYL CITRATE (PF) 100 MCG/2ML IJ SOLN
INTRAMUSCULAR | Status: DC | PRN
  Administered 2017-08-16: 50 ug via INTRAVENOUS

## 2017-08-16 MED ORDER — FENTANYL CITRATE (PF) 100 MCG/2ML IJ SOLN: 25.0000 ug | INTRAMUSCULAR | Status: DC | PRN

## 2017-08-16 MED ORDER — FENTANYL CITRATE (PF) 100 MCG/2ML IJ SOLN
INTRAMUSCULAR | Status: AC
  Filled 2017-08-16: qty 2

## 2017-08-16 MED ORDER — IOTHALAMATE MEGLUMINE 43 % IV SOLN
INTRAVENOUS | Status: DC | PRN
  Administered 2017-08-16: 5 mL via SURGICAL_CAVITY

## 2017-08-16 MED ORDER — ONDANSETRON HCL 4 MG/2ML IJ SOLN: 4.0000 mg | Freq: Once | INTRAMUSCULAR | Status: DC | PRN

## 2017-08-16 MED ORDER — PROPOFOL 10 MG/ML IV BOLUS
INTRAVENOUS | Status: AC
  Filled 2017-08-16: qty 20

## 2017-08-16 MED ORDER — MIDAZOLAM HCL 2 MG/2ML IJ SOLN
INTRAMUSCULAR | Status: AC
  Filled 2017-08-16: qty 2

## 2017-08-16 MED ORDER — ONDANSETRON HCL 4 MG/2ML IJ SOLN
INTRAMUSCULAR | Status: DC | PRN
  Administered 2017-08-16: 4 mg via INTRAVENOUS

## 2017-08-16 MED ORDER — LIDOCAINE HCL (PF) 2 % IJ SOLN
INTRAMUSCULAR | Status: AC
  Filled 2017-08-16: qty 10

## 2017-08-16 MED ORDER — PROPOFOL 10 MG/ML IV BOLUS
INTRAVENOUS | Status: DC | PRN
  Administered 2017-08-16: 120 mg via INTRAVENOUS

## 2017-08-16 MED ORDER — LIDOCAINE HCL (CARDIAC) PF 100 MG/5ML IV SOSY
PREFILLED_SYRINGE | INTRAVENOUS | Status: DC | PRN
  Administered 2017-08-16: 100 mg via INTRAVENOUS

## 2017-08-16 MED ORDER — ONDANSETRON HCL 4 MG/2ML IJ SOLN
INTRAMUSCULAR | Status: AC
  Filled 2017-08-16: qty 2

## 2017-08-16 SURGICAL SUPPLY — 27 items
BAG DRAIN CYSTO-URO LG1000N (MISCELLANEOUS) ×3 IMPLANT
BASKET ZERO TIP 1.9FR (BASKET) ×3 IMPLANT
CATH URETL 5X70 OPEN END (CATHETERS) ×3 IMPLANT
CNTNR SPEC 2.5X3XGRAD LEK (MISCELLANEOUS)
CONRAY 43 FOR UROLOGY 50M (MISCELLANEOUS) ×3 IMPLANT
CONT SPEC 4OZ STER OR WHT (MISCELLANEOUS)
CONTAINER SPEC 2.5X3XGRAD LEK (MISCELLANEOUS) IMPLANT
DRAPE UTILITY 15X26 TOWEL STRL (DRAPES) ×3 IMPLANT
FIBER LASER LITHO 273 (Laser) IMPLANT
GLOVE BIO SURGEON STRL SZ 6.5 (GLOVE) ×2 IMPLANT
GLOVE BIO SURGEONS STRL SZ 6.5 (GLOVE) ×1
GOWN STRL REUS W/ TWL LRG LVL3 (GOWN DISPOSABLE) ×2 IMPLANT
GOWN STRL REUS W/TWL LRG LVL3 (GOWN DISPOSABLE) ×4
GUIDEWIRE GREEN .038 145CM (MISCELLANEOUS) IMPLANT
INFUSOR MANOMETER BAG 3000ML (MISCELLANEOUS) ×3 IMPLANT
INTRODUCER DILATOR DOUBLE (INTRODUCER) IMPLANT
KIT TURNOVER CYSTO (KITS) ×3 IMPLANT
PACK CYSTO AR (MISCELLANEOUS) ×3 IMPLANT
SENSORWIRE 0.038 NOT ANGLED (WIRE) ×3
SET CYSTO W/LG BORE CLAMP LF (SET/KITS/TRAYS/PACK) ×3 IMPLANT
SHEATH URETERAL 12FRX35CM (MISCELLANEOUS) IMPLANT
SOL .9 NS 3000ML IRR  AL (IV SOLUTION) ×2
SOL .9 NS 3000ML IRR UROMATIC (IV SOLUTION) ×1 IMPLANT
STENT URET 6FRX26 CONTOUR (STENTS) ×3 IMPLANT
SURGILUBE 2OZ TUBE FLIPTOP (MISCELLANEOUS) ×3 IMPLANT
WATER STERILE IRR 1000ML POUR (IV SOLUTION) ×3 IMPLANT
WIRE SENSOR 0.038 NOT ANGLED (WIRE) ×1 IMPLANT

## 2017-08-16 NOTE — Op Note (Signed)
Date of procedure: 08/16/17  Preoperative diagnosis:  1. Left distal ureteral calculus 2. Refractory left flank pain  Postoperative diagnosis:  1. Same as above  Procedure: 1. Left ureteroscopy 2. Laser lithotripsy 3. Left ureteral stent placement 4. Left retrograde pyelogram 5. Basket extraction of stone fragment  Surgeon: Hollice Espy, MD  Anesthesia: General  Complications: None  Intraoperative findings: 9 mm left distal ureteral calculus.  No signs of infection or purulent drainage.  Stone treated without difficulty, all fragments removed.  Stent placed on a string.  EBL: Minimal  Specimens: Stone fragment 6 x 26 French double-J ureteral stent on left side, tether left in place  Drains: 6 x 26 French double-J ureteral stent on left, tether left in place  Indication: Adrian Marshall is a 62 y.o. patient with 9 mm obstructing left distal ureteral fragment and poorly controlled refractory pain.  After reviewing the management options for treatment, he elected to proceed with the above surgical procedure(s). We have discussed the potential benefits and risks of the procedure, side effects of the proposed treatment, the likelihood of the patient achieving the goals of the procedure, and any potential problems that might occur during the procedure or recuperation. Informed consent has been obtained.  Description of procedure:  The patient was taken to the operating room and general anesthesia was induced.  The Foley which was previously indwelling was removed.  The patient was placed in the dorsal lithotomy position, prepped and draped in the usual sterile fashion, and preoperative antibiotics were administered. A preoperative time-out was performed.   A 21 French scope was advanced per urethra into the bladder.  Notably, changes with a recent TURP were appreciated within the prostatic fossa.  Attention was turned to the left ureteral orifice.  On scout imaging, the stone  could be easily seen within the left distal ureter.  A wire was in place up to level of the kidney alongside of the stone without difficulty.  This was not to place as a safety wire.  A 4.5 semirigid long ureteroscope was then advanced to the level of the stone.  A 273 m laser fiber was then brought in and using settings of 0.8 J and 10 Hz, the stone was fragmented into at least 10 smaller pieces.  There is no concern for infection or purulent drainage.  A 1.9 French tipless nitinol basket was then used to extract each of these pieces out individually without difficulty.  There were thrown into the bladder.  The scope was then able to be advanced up to the proximal ureter no additional stone fragment was identified.  The level of the stone, there was minimal edema and no injury appreciated.  A final retrograde pyelogram showed mild hydroureteronephrosis but prompt drainage where the stone had been previously identified and no extravasation.  The scope was then removed.  The safety wire was backloaded over rigid cystoscope.  A 6 x 26 French double-J ureteral stent was advanced over the wire up to level the kidney.  The wires partially drawn until focal stone within the renal pelvis.  The wires and fully withdrawn and a full coils noted within the bladder.  Stent string was left attached to the distal coil of the stent.  The bladder was then drained and the stone fragments were evacuated for the bladder and sent off for specimen.  The second string was affixed to the patient's glans using muscle and Tegaderm.  He was then clean and dry, repositioned supine position, reversed  from anesthesia, taken the PACU in stable condition.  Plan: Catheter was not replaced and he will need to void prior to discharge.  He may be discharged later this afternoon.  This was discussed with the patient's family who is agreeable this plan.  All questions answered.  We will follow-up in 4 weeks with renal ultrasound prior.   Hollice Espy, M.D.

## 2017-08-16 NOTE — Anesthesia Procedure Notes (Signed)
Procedure Name: LMA Insertion Performed by: Danicia Terhaar, CRNA Pre-anesthesia Checklist: Patient identified, Patient being monitored, Timeout performed, Emergency Drugs available and Suction available Patient Re-evaluated:Patient Re-evaluated prior to induction Oxygen Delivery Method: Circle system utilized Preoxygenation: Pre-oxygenation with 100% oxygen Induction Type: IV induction LMA: LMA inserted LMA Size: 4.5 Tube type: Oral Number of attempts: 1 Placement Confirmation: positive ETCO2 and breath sounds checked- equal and bilateral Tube secured with: Tape Dental Injury: Teeth and Oropharynx as per pre-operative assessment        

## 2017-08-16 NOTE — Anesthesia Preprocedure Evaluation (Signed)
Anesthesia Evaluation  Patient identified by MRN, date of birth, ID band Patient awake    Reviewed: Allergy & Precautions, NPO status , Patient's Chart, lab work & pertinent test results, reviewed documented beta blocker date and time   Airway Mallampati: III  TM Distance: >3 FB     Dental  (+) Chipped   Pulmonary sleep apnea and Continuous Positive Airway Pressure Ventilation ,           Cardiovascular + CAD       Neuro/Psych    GI/Hepatic   Endo/Other  Hypothyroidism   Renal/GU Renal disease     Musculoskeletal  (+) Arthritis ,   Abdominal   Peds  Hematology   Anesthesia Other Findings Cervical fusion adequate neck movement.  Reproductive/Obstetrics                             Anesthesia Physical Anesthesia Plan  ASA: III  Anesthesia Plan: General   Post-op Pain Management:    Induction: Intravenous  PONV Risk Score and Plan:   Airway Management Planned: LMA  Additional Equipment:   Intra-op Plan:   Post-operative Plan:   Informed Consent: I have reviewed the patients History and Physical, chart, labs and discussed the procedure including the risks, benefits and alternatives for the proposed anesthesia with the patient or authorized representative who has indicated his/her understanding and acceptance.     Plan Discussed with: CRNA  Anesthesia Plan Comments:         Anesthesia Quick Evaluation

## 2017-08-16 NOTE — Discharge Instructions (Signed)
You have a ureteral stent in place.  This is a tube that extends from your kidney to your bladder.  This may cause urinary bleeding, burning with urination, and urinary frequency.  Please call our office or present to the ED if you develop fevers >101 or pain which is not able to be controlled with oral pain medications.  You may be given either Flomax and/ or ditropan to help with bladder spasms and stent pain in addition to pain medications.    Your stent is attached to a string which is taped to the head of your penis.  On Wednesday, you may untape the string and pulled gently until the entire stent is removed.  Please try your best to keep the stent in until this date.  If it is accidentally dislodged prematurely, please call our office to let us know nonurgently.  Mark [address], [place], [place] [address] [phone]

## 2017-08-16 NOTE — Progress Notes (Signed)
Notified Dr Erlene Quan that pt voided 138ml pink urine. Post void bladder scan 73ml. Pt also requested to eat. Per MD to order regular diet and to discharge pt.

## 2017-08-16 NOTE — Transfer of Care (Signed)
Immediate Anesthesia Transfer of Care Note  Patient: Adrian Marshall  Procedure(s) Performed: CYSTOSCOPY WITH RETROGRADE PYELOGRAM, URETEROSCOPY AND STENT PLACEMENT (Left Ureter) URETEROSCOPY WITH HOLMIUM LASER LITHOTRIPSY (Left Ureter)  Patient Location: PACU  Anesthesia Type:General  Level of Consciousness: sedated and responds to stimulation  Airway & Oxygen Therapy: Patient Spontanous Breathing and Patient connected to face mask oxygen  Post-op Assessment: Report given to RN and Post -op Vital signs reviewed and stable  Post vital signs: Reviewed and stable  Last Vitals:  Vitals Value Taken Time  BP 130/63 08/16/2017  3:30 PM  Temp    Pulse 85 08/16/2017  3:30 PM  Resp    SpO2 100 % 08/16/2017  3:30 PM    Last Pain:  Vitals:   08/16/17 1219  TempSrc: Oral  PainSc:       Patients Stated Pain Goal: Other (Comment)(Pt declined an intervaention at this time.) (95/62/13 0865)  Complications: No apparent anesthesia complications

## 2017-08-16 NOTE — Anesthesia Postprocedure Evaluation (Signed)
Anesthesia Post Note  Patient: Adrian Marshall  Procedure(s) Performed: CYSTOSCOPY WITH RETROGRADE PYELOGRAM, URETEROSCOPY AND STENT PLACEMENT (Left Ureter) URETEROSCOPY WITH HOLMIUM LASER LITHOTRIPSY (Left Ureter)  Patient location during evaluation: PACU Anesthesia Type: General Level of consciousness: awake and alert Pain management: pain level controlled Vital Signs Assessment: post-procedure vital signs reviewed and stable Respiratory status: spontaneous breathing, nonlabored ventilation, respiratory function stable and patient connected to nasal cannula oxygen Cardiovascular status: blood pressure returned to baseline and stable Postop Assessment: no apparent nausea or vomiting Anesthetic complications: no     Last Vitals:  Vitals:   08/16/17 1614 08/16/17 1636  BP: 132/73 133/73  Pulse: 79 83  Resp: 16 16  Temp:  36.4 C  SpO2: 96% 98%    Last Pain:  Vitals:   08/16/17 1638  TempSrc:   PainSc: [place]

## 2017-08-16 NOTE — Anesthesia Post-op Follow-up Note (Signed)
Anesthesia QCDR form completed.        

## 2017-08-16 NOTE — Discharge Summary (Signed)
Date of admission: 08/15/2017  Date of discharge: 08/16/2017  Admission diagnosis: Left ureteral calculus  Discharge diagnosis: Same as above  Secondary diagnoses:  Patient Active Problem List   Diagnosis Date Noted  . Left ureteral stone 08/15/2017  . S/P TURP 07/20/2017  . Benign prostatic hyperplasia with incomplete bladder emptying 06/17/2017  . Nephrolithiasis 06/17/2017    History and Physical: For full details, please see admission history and physical. Briefly, Adrian Marshall is a 62 y.o. year old patient with an obstructing left 9 mm distal ureteral calculus and poorly controlled pain.  He was admitted for overnight observation, pain control and ureteroscopy the following morning to address the stone.   Hospital Course: Patient tolerated the procedure well.  He was then transferred to the floor after an uneventful PACU stay.  His hospital course was uncomplicated.  He tolerated the procedure well.  He was able to void spontaneously. he had met discharge criteria: was eating a regular diet, was up and ambulating independently,  pain was well controlled, was voiding without a catheter, and was ready to for discharge.   Laboratory values:  Recent Labs    08/15/17 0008 08/15/17 2036  WBC 7.9 8.7  HGB 14.1 14.2  HCT 41.7 40.8   Recent Labs    08/15/17 0008 08/15/17 2036  NA 138 139  K 3.8 3.9  CL 107 110  CO2 22 22  GLUCOSE 117* 122*  BUN 20 21*  CREATININE 1.28* 1.66*  CALCIUM 9.3 8.7*   No results for input(s): LABPT, INR in the last 72 hours. No results for input(s): LABURIN in the last 72 hours. Results for orders placed or performed during the hospital encounter of 08/14/17  Urine Culture     Status: None   Collection Time: 08/14/17 11:47 PM  Result Value Ref Range Status   Specimen Description   Final    URINE, CATHETERIZED Performed at [facility], [address] [place], [place] [address]    Special Requests   Final    NONE Performed at [facility], [address], [place], [place] [address]    Culture   Final    NO GROWTH Performed at [facility], [place] [address], [place], [place] [address]    Report Status 08/16/2017 FINAL  Final  Blood culture (routine x 2)     Status: None (Preliminary result)   Collection Time: 08/15/17  3:25 AM  Result Value Ref Range Status   Specimen Description LEFT ANTECUBITAL  Final   Special Requests   Final    BOTTLES DRAWN AEROBIC AND ANAEROBIC Blood Culture adequate volume   Culture   Final    NO GROWTH 1 DAY Performed at [facility], [address], [place], [place] [address]    Report Status PENDING  Incomplete  Blood culture (routine x 2)     Status: None (Preliminary result)   Collection Time: 08/15/17  3:32 AM  Result Value Ref Range Status   Specimen Description RIGHT ANTECUBITAL  Final   Special Requests   Final    BOTTLES DRAWN AEROBIC AND ANAEROBIC Blood Culture adequate volume   Culture   Final    NO GROWTH 1 DAY Performed at [facility], [address], [place], [place] [address]    Report Status PENDING  Incomplete    Disposition: Home  Discharge instruction: See attached instructions  Discharge medications:  Allergies as of 08/16/2017      Reactions   Cortisone  inj to shoulder - paralysis   Neurontin [gabapentin]    Altered mental state   Other    Steroids - paralysis       Medication List    TAKE these medications   acetaminophen 500 MG tablet Commonly known as:  TYLENOL Take 500 mg by mouth daily as needed for moderate pain or headache.   atorvastatin 80 MG tablet Commonly known as:  LIPITOR Take 80 mg by mouth every evening.   cephALEXin 500 MG capsule Commonly known as:  KEFLEX Take 1 capsule (500 mg total) by mouth 4 (four) times daily.   cetirizine 10 MG tablet Commonly known as:  ZYRTEC Take 10 mg by mouth daily as needed for allergies.   cholecalciferol 1000 units tablet Commonly known as:  VITAMIN D Take 1,000 Units by mouth daily.    dorzolamide 2 % ophthalmic solution Commonly known as:  TRUSOPT Place 1 drop into both eyes 2 (two) times daily.   ibuprofen 800 MG tablet Commonly known as:  ADVIL,MOTRIN Take 1 tablet (800 mg total) by mouth every 8 (eight) hours as needed for moderate pain.   levothyroxine 200 MCG tablet Commonly known as:  SYNTHROID, LEVOTHROID Take 200 mcg by mouth daily before breakfast.   MYRBETRIQ 50 MG Tb24 tablet Generic drug:  mirabegron ER Take 50 mg by mouth daily.   ondansetron 4 MG disintegrating tablet Commonly known as:  ZOFRAN ODT Take 1 tablet (4 mg total) by mouth every 8 (eight) hours as needed for nausea or vomiting.   oxyCODONE-acetaminophen 5-325 MG tablet Commonly known as:  PERCOCET/ROXICET Take 1 tablet by mouth every 4 (four) hours as needed for severe pain.   tamsulosin 0.4 MG Caps capsule Commonly known as:  FLOMAX Take 0.4 mg by mouth 2 (two) times daily.       Followup:  Follow-up Information    Stoioff, Ronda Fairly, MD In 4 weeks.   Specialty:  Urology Why:  For follow up with renal ultrasound prior to visit Contact information: [place] [address] [phone]

## 2017-08-16 NOTE — Progress Notes (Signed)
Pt is being discharged home. Discharge papers given and explained to pt and spouse, both verbalized understanding. Meds and f/u appointments reviewed. No RX at this time. Pt wants to walk to his car.

## 2017-08-16 NOTE — Plan of Care (Signed)
  Problem: Health Behavior/Discharge Planning: Goal: Ability to manage health-related needs will improve Outcome: Progressing   Problem: Clinical Measurements: Goal: Ability to maintain clinical measurements within normal limits will improve Outcome: Progressing Goal: Will remain free from infection Outcome: Progressing Goal: Respiratory complications will improve Outcome: Progressing   Problem: Activity: Goal: Risk for activity intolerance will decrease Outcome: Progressing   Problem: Nutrition: Goal: Adequate nutrition will be maintained Outcome: Progressing   Problem: Coping: Goal: Level of anxiety will decrease Outcome: Progressing   Problem: Elimination: Goal: Will not experience complications related to urinary retention Outcome: Progressing   Problem: Pain Managment: Goal: General experience of comfort will improve Outcome: Progressing   Problem: Safety: Goal: Ability to remain free from injury will improve Outcome: Progressing

## 2017-08-17 ENCOUNTER — Encounter: Payer: Self-pay | Admitting: Urology

## 2017-08-19 ENCOUNTER — Ambulatory Visit: Admitting: Urology

## 2017-08-20 LAB — CULTURE, BLOOD (ROUTINE X 2)
Culture: NO GROWTH
Culture: NO GROWTH
SPECIAL REQUESTS: ADEQUATE
Special Requests: ADEQUATE

## 2017-08-20 LAB — STONE ANALYSIS
CA OXALATE, DIHYDRATE: 30 %
CA OXALATE, MONOHYDR.: 50 %
CA PHOS CRY STONE QL IR: 20 %
Stone Weight KSTONE: 12.4 mg

## 2017-08-21 ENCOUNTER — Ambulatory Visit
Admission: RE | Admit: 2017-08-21 | Discharge: 2017-08-21 | Disposition: A | Discharge status: Home / Self Care | Source: Ambulatory Visit | Attending: Urology | Admitting: Urology

## 2017-08-21 DIAGNOSIS — N201 Calculus of ureter: Secondary | ICD-10-CM

## 2017-08-21 DIAGNOSIS — N200 Calculus of kidney: Secondary | ICD-10-CM | POA: Insufficient documentation

## 2017-08-25 LAB — HIV ANTIBODY (ROUTINE TESTING W REFLEX): HIV SCREEN 4TH GENERATION: NONREACTIVE

## 2017-09-03 ENCOUNTER — Ambulatory Visit (INDEPENDENT_AMBULATORY_CARE_PROVIDER_SITE_OTHER)

## 2017-09-03 VITALS — BP 123/82 | HR 121

## 2017-09-03 DIAGNOSIS — R300 Dysuria: Secondary | ICD-10-CM

## 2017-09-03 LAB — URINALYSIS, COMPLETE
Bilirubin, UA: NEGATIVE
GLUCOSE, UA: NEGATIVE
Ketones, UA: NEGATIVE
NITRITE UA: NEGATIVE
Specific Gravity, UA: 1.01 (ref 1.005–1.030)
UUROB: 0.2 mg/dL (ref 0.2–1.0)
pH, UA: 6.5 (ref 5.0–7.5)

## 2017-09-03 LAB — MICROSCOPIC EXAMINATION: Epithelial Cells (non renal): NONE SEEN /hpf (ref 0–10)

## 2017-09-03 MED ORDER — SULFAMETHOXAZOLE-TRIMETHOPRIM 800-160 MG PO TABS: 1.0000 | ORAL_TABLET | Freq: Two times a day (BID) | ORAL | 0 refills | Status: DC

## 2017-09-03 NOTE — Progress Notes (Signed)
Patient present today complaining of dysuria increasing over the past week.  Patient also notes some perineum pain and discomfort. Patient instructed to try AZO and OTC NSAIDs to help with burning and discomfort. UA today is positive for infection per Dr. Bernardo Heater will send for culture and start treatment for abx today and call with culture results.

## 2017-09-07 LAB — CULTURE, URINE COMPREHENSIVE

## 2017-09-09 ENCOUNTER — Telehealth: Payer: Self-pay

## 2017-09-09 NOTE — Telephone Encounter (Signed)
-----   Message from Abbie Sons, MD sent at 09/08/2017 11:50 AM EDT ----- Urine culture was negative for infection

## 2017-09-16 ENCOUNTER — Ambulatory Visit (INDEPENDENT_AMBULATORY_CARE_PROVIDER_SITE_OTHER): Admitting: Urology

## 2017-09-16 ENCOUNTER — Encounter: Payer: Self-pay | Admitting: Urology

## 2017-09-16 VITALS — BP 119/72 | HR 80 | Resp 16 | Ht 70.0 in | Wt 195.8 lb

## 2017-09-16 DIAGNOSIS — R3915 Urgency of urination: Secondary | ICD-10-CM

## 2017-09-16 LAB — BLADDER SCAN AMB NON-IMAGING: Scan Result: 0

## 2017-09-16 NOTE — Progress Notes (Signed)
09/16/2017 12:01 PM   Adrian Marshall 1955-10-03 [id number]  Referring provider: Ledell Noss [place], [place] [address]  Chief Complaint  Patient presents with  . Follow-up    HPI: 62 year old male presents for postop follow-up.  He underwent TURP on 07/20/2017 for BPH with incomplete bladder emptying with a PVR 195 mL.  He had initial episode of postoperative retention which resolved with catheter placement.  In early May he had sudden onset of urinary frequency, urgency and voiding difficulties.  He was found to have a 9 mm right distal ureteral calculus with migration from a nonobstructing position in the kidney that had been monitored by the [place] for several years.  He underwent ureteroscopic removal with Dr. Erlene Quan.  He had no problems after stent removal.  He states he is voiding with an excellent stream and denies frequency, urgency.  He has nocturia 1 time per night.  He notes occasional perineal pain and pain at the base of the penis.  A urine culture performed last month was negative for infection.  Postoperative renal ultrasound showed resolution of his hydronephrosis.  The radiologist noted a 3 mm calculus in the lower pole of the right kidney however this is not present on CT.    Stone analysis was mixed calcium oxalate/calcium phosphate  Prostate pathology showed benign prostate tissue with 14 g resected.    PMH: Past Medical History:  Diagnosis Date  . Arthritis    NECK  . BPH (benign prostatic hyperplasia)   . Cancer (HCC)    SKIN CANCER-BASAL  . Coronary artery disease   . History of kidney stones   . Hypercholesterolemia   . Hypothyroidism   . Renal disorder    kidney stones  . Sleep apnea    USES CPAP  . Thyroid disease     Surgical History: Past Surgical History:  Procedure Laterality Date  . ANKLE SURGERY    . CYSTOSCOPY WITH RETROGRADE PYELOGRAM, URETEROSCOPY AND STENT PLACEMENT Left 08/16/2017   Procedure:  CYSTOSCOPY WITH RETROGRADE PYELOGRAM, URETEROSCOPY AND STENT PLACEMENT;  Surgeon: Hollice Espy, MD;  Location: [facility];  Service: Urology;  Laterality: Left;  . HERNIA REPAIR    . KNEE SURGERY    . NECK SURGERY    . SHOULDER SURGERY    . TRANSURETHRAL RESECTION OF PROSTATE N/A 07/20/2017   Procedure: TRANSURETHRAL RESECTION OF THE PROSTATE (TURP);  Surgeon: Abbie Sons, MD;  Location: [facility];  Service: Urology;  Laterality: N/A;  . URETEROSCOPY WITH HOLMIUM LASER LITHOTRIPSY Left 08/16/2017   Procedure: URETEROSCOPY WITH HOLMIUM LASER LITHOTRIPSY;  Surgeon: Hollice Espy, MD;  Location: [facility];  Service: Urology;  Laterality: Left;  Marland Kitchen VASECTOMY      Home Medications:  Allergies as of 09/16/2017      Reactions   Cortisone    inj to shoulder - paralysis   Neurontin [gabapentin]    Altered mental state   Other    Steroids - paralysis       Medication List        Accurate as of 09/16/17 12:01 PM. Always use your most recent med list.          acetaminophen 500 MG tablet Commonly known as:  TYLENOL Take 500 mg by mouth daily as needed for moderate pain or headache.   atorvastatin 80 MG tablet Commonly known as:  LIPITOR Take 80 mg by mouth every evening.   cephALEXin 500 MG capsule Commonly known as:  KEFLEX Take 1 capsule (500 mg total) by mouth 4 (four) times daily.   cetirizine 10 MG tablet Commonly known as:  ZYRTEC Take 10 mg by mouth daily as needed for allergies.   cholecalciferol 1000 units tablet Commonly known as:  VITAMIN D Take 1,000 Units by mouth daily.   VITAMIN D3 SUPER STRENGTH 2000 units Tabs Generic drug:  Cholecalciferol   dorzolamide 2 % ophthalmic solution Commonly known as:  TRUSOPT Place 1 drop into both eyes 2 (two) times daily.   ibuprofen 800 MG tablet Commonly known as:  ADVIL,MOTRIN Take 1 tablet (800 mg total) by mouth every 8 (eight) hours as needed for moderate pain.   levothyroxine 200 MCG tablet Commonly known as:   SYNTHROID, LEVOTHROID Take 200 mcg by mouth daily before breakfast.   MYRBETRIQ 50 MG Tb24 tablet Generic drug:  mirabegron ER Take 50 mg by mouth daily.   ondansetron 4 MG disintegrating tablet Commonly known as:  ZOFRAN ODT Take 1 tablet (4 mg total) by mouth every 8 (eight) hours as needed for nausea or vomiting.   oxyCODONE-acetaminophen 5-325 MG tablet Commonly known as:  PERCOCET/ROXICET Take 1 tablet by mouth every 4 (four) hours as needed for severe pain.   sulfamethoxazole-trimethoprim 800-160 MG tablet Commonly known as:  BACTRIM DS,SEPTRA DS Take 1 tablet by mouth every 12 (twelve) hours.   tamsulosin 0.4 MG Caps capsule Commonly known as:  FLOMAX Take 0.4 mg by mouth 2 (two) times daily.       Allergies:  Allergies  Allergen Reactions  . Cortisone     inj to shoulder - paralysis  . Neurontin [Gabapentin]     Altered mental state  . Other     Steroids - paralysis     Family History: Family History  Problem Relation Age of Onset  . Prostate cancer Neg Hx   . Bladder Cancer Neg Hx   . Kidney cancer Neg Hx     Social History:  reports that he has never smoked. He has never used smokeless tobacco. He reports that he drinks alcohol. He reports that he does not use drugs.  ROS: UROLOGY Frequent Urination?: No Hard to postpone urination?: No Burning/pain with urination?: Yes Get up at night to urinate?: No Leakage of urine?: No Urine stream starts and stops?: No Trouble starting stream?: No Do you have to strain to urinate?: No Blood in urine?: No Urinary tract infection?: No Sexually transmitted disease?: No Injury to kidneys or bladder?: No Painful intercourse?: No Weak stream?: No Erection problems?: No Penile pain?: No  Gastrointestinal Nausea?: No Vomiting?: No Indigestion/heartburn?: No Diarrhea?: No Constipation?: No  Constitutional Fever: No Night sweats?: No Weight loss?: No Fatigue?: No  Skin Skin rash/lesions?:  No Itching?: No  Eyes Blurred vision?: No Double vision?: No  Ears/Nose/Throat Sore throat?: No Sinus problems?: No  Hematologic/Lymphatic Swollen glands?: No Easy bruising?: No  Cardiovascular Leg swelling?: No Chest pain?: No  Respiratory Cough?: No Shortness of breath?: No  Endocrine Excessive thirst?: No  Musculoskeletal Back pain?: No Joint pain?: No  Neurological Headaches?: No Dizziness?: No  Psychologic Depression?: No Anxiety?: No  Physical Exam: BP 119/72   Pulse 80   Resp 16   Ht 5\' 10"  (1.778 m)   Wt 195 lb 12.8 oz (88.8 kg)   SpO2 96%   BMI 28.09 kg/m   Constitutional:  Alert and oriented, No acute distress.   Pertinent Imaging: Here  Results for orders placed during the hospital encounter of 08/21/17  US RENAL   Narrative CLINICAL DATA:  Left ureteral stone.  EXAM: RENAL / URINARY TRACT ULTRASOUND COMPLETE  COMPARISON:  None.  FINDINGS: Right Kidney:  Length: 10.4 cm. Echogenicity within normal limits. No mass or hydronephrosis visualized. 3 mm calculus within the lower pole of the right kidney, nonobstructive.  Left Kidney:  Length: 11.7 cm. Echogenicity within normal limits. No mass or hydronephrosis visualized.  Bladder:  Appears normal for degree of bladder distention. Bilateral ureteral jets seen.  IMPRESSION: 3 mm nonobstructive right lower pole renal calculus, otherwise unremarkable.   Electronically Signed   By: Fidela Salisbury M.D.   On: 08/21/2017 23:18     Results for orders placed during the hospital encounter of 08/14/17  CT Renal Stone Study   Narrative CLINICAL DATA:  62 y/o M; TURP procedure 4/19. Patient presents with hematuria and unable to urinate normally.  EXAM: CT ABDOMEN AND PELVIS WITHOUT CONTRAST  TECHNIQUE: Multidetector CT imaging of the abdomen and pelvis was performed following the standard protocol without IV contrast.  COMPARISON:  12/02/2010 CT abdomen and  pelvis.  FINDINGS: Lower chest: No acute abnormality.  Hepatobiliary: No focal liver abnormality is seen. No gallstones, gallbladder wall thickening, or biliary dilatation.  Pancreas: Unremarkable. No pancreatic ductal dilatation or surrounding inflammatory changes.  Spleen: Normal in size without focal abnormality.  Adrenals/Urinary Tract: Normal adrenal glands. Punctate bilateral kidney lower pole stones. No focal kidney lesion. No right hydronephrosis. Bladder collapsed around Foley catheter.  Moderate left hydronephrosis and perinephric stranding with distal left ureter stone measuring up to 9 mm approximately 4 cm upstream to the ureterovesicular junction (series 5, image 60 and series 2, image 73).  Stomach/Bowel: Stomach is within normal limits. Appendix appears normal. No evidence of bowel wall thickening, distention, or inflammatory changes. Sigmoid diverticulosis without findings of acute diverticulitis.  Vascular/Lymphatic: Aortic atherosclerosis. No enlarged abdominal or pelvic lymph nodes.  Reproductive: Status post TURP.  Prostatic calcification.  Other: No abdominal wall hernia or abnormality. No abdominopelvic ascites.  Musculoskeletal: No fracture is seen.  IMPRESSION: 1. Moderate left hydronephrosis and perinephric stranding with distal left ureter stone measuring up to 9 mm approximately 4 cm upstream to the ureterovesicular junction. 2. Bilateral punctate kidney lower pole nephrolithiasis. 3. Sigmoid diverticulosis without findings of diverticulitis. 4. Aortic atherosclerosis.   Electronically Signed   By: Kristine Garbe M.D.   On: 08/15/2017 01:41     Assessment & Plan:   Doing well status post TURP and ureteroscopic stone removal.  PVR by bladder scan today was 0 mL.  His perineal discomfort which is intermittent is most likely related to postoperative prostatic inflammation.  He has been followed at the [facility] for stone disease and had  a recent 24 urine study which he states he will bring by after he gets the results.  Follow-up 6 months.   Return in about 6 months (around 03/18/2018) for Recheck.  Abbie Sons, [place] [address], [place], [place] [address] [phone]

## 2018-01-29 ENCOUNTER — Ambulatory Visit
Admission: RE | Admit: 2018-01-29 | Discharge: 2018-01-29 | Disposition: A | Discharge status: Home / Self Care | Source: Ambulatory Visit | Attending: Urology | Admitting: Urology

## 2018-01-29 ENCOUNTER — Ambulatory Visit (INDEPENDENT_AMBULATORY_CARE_PROVIDER_SITE_OTHER): Admitting: Urology

## 2018-01-29 ENCOUNTER — Encounter: Payer: Self-pay | Admitting: Urology

## 2018-01-29 VITALS — BP 131/81 | HR 118 | Ht 70.0 in | Wt 197.0 lb

## 2018-01-29 DIAGNOSIS — N411 Chronic prostatitis: Secondary | ICD-10-CM | POA: Diagnosis not present

## 2018-01-29 DIAGNOSIS — Z87442 Personal history of urinary calculi: Secondary | ICD-10-CM | POA: Diagnosis present

## 2018-01-29 DIAGNOSIS — R3129 Other microscopic hematuria: Secondary | ICD-10-CM | POA: Diagnosis not present

## 2018-01-29 DIAGNOSIS — R102 Pelvic and perineal pain: Secondary | ICD-10-CM | POA: Insufficient documentation

## 2018-01-29 DIAGNOSIS — N200 Calculus of kidney: Secondary | ICD-10-CM

## 2018-01-29 LAB — URINALYSIS, COMPLETE
Bilirubin, UA: NEGATIVE
Glucose, UA: NEGATIVE
KETONES UA: NEGATIVE
Leukocytes, UA: NEGATIVE
Nitrite, UA: NEGATIVE
Protein, UA: NEGATIVE
SPEC GRAV UA: 1.02 (ref 1.005–1.030)
UUROB: 0.2 mg/dL (ref 0.2–1.0)
pH, UA: 5.5 (ref 5.0–7.5)

## 2018-01-29 LAB — MICROSCOPIC EXAMINATION
Bacteria, UA: NONE SEEN
EPITHELIAL CELLS (NON RENAL): NONE SEEN /HPF (ref 0–10)
WBC UA: NONE SEEN /HPF (ref 0–5)

## 2018-01-29 NOTE — Progress Notes (Signed)
01/29/2018 9:56 AM   Adrian Marshall May 01, 1955 [id number]  Referring provider: Ledell Noss [place], [place] [address]  Chief Complaint  Patient presents with  . Prostatitis    HPI: 62 year old male presents for evaluation of possible nonbacterial prostatitis.  He is status post TURP in April 2019 for BPH with incomplete bladder emptying.  He is presently voiding with an excellent stream.  1 month postop he had a 9 mm distal ureteral calculus which was treated ureteroscopically by Dr. Erlene Quan.  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  He presents with a several day history of mild perineal and rectal discomfort with minimal dysuria.  He denies gross hematuria.  He denies flank or abdominal pain.   PMH: Past Medical History:  Diagnosis Date  . Arthritis    NECK  . BPH (benign prostatic hyperplasia)   . Cancer (HCC)    SKIN CANCER-BASAL  . Coronary artery disease   . History of kidney stones   . Hypercholesterolemia   . Hypothyroidism   . Renal disorder    kidney stones  . Sleep apnea    USES CPAP  . Thyroid disease     Surgical History: Past Surgical History:  Procedure Laterality Date  . ANKLE SURGERY    . CYSTOSCOPY WITH RETROGRADE PYELOGRAM, URETEROSCOPY AND STENT PLACEMENT Left 08/16/2017   Procedure: CYSTOSCOPY WITH RETROGRADE PYELOGRAM, URETEROSCOPY AND STENT PLACEMENT;  Surgeon: Hollice Espy, MD;  Location: [facility];  Service: Urology;  Laterality: Left;  . HERNIA REPAIR    . KNEE SURGERY    . NECK SURGERY    . SHOULDER SURGERY    . TRANSURETHRAL RESECTION OF PROSTATE N/A 07/20/2017   Procedure: TRANSURETHRAL RESECTION OF THE PROSTATE (TURP);  Surgeon: Abbie Sons, MD;  Location: [facility];  Service: Urology;  Laterality: N/A;  . URETEROSCOPY WITH HOLMIUM LASER LITHOTRIPSY Left 08/16/2017   Procedure: URETEROSCOPY WITH HOLMIUM LASER LITHOTRIPSY;  Surgeon: Hollice Espy, MD;  Location:  [facility];  Service: Urology;  Laterality: Left;  Marland Kitchen VASECTOMY      Home Medications:  Allergies as of 01/29/2018      Reactions   Cortisone    inj to shoulder - paralysis   Neurontin [gabapentin]    Altered mental state   Other    Steroids - paralysis       Medication List        Accurate as of 01/29/18  9:56 AM. Always use your most recent med list.          acetaminophen 500 MG tablet Commonly known as:  TYLENOL Take 500 mg by mouth daily as needed for moderate pain or headache.   amitriptyline 25 MG tablet Commonly known as:  ELAVIL   atorvastatin 80 MG tablet Commonly known as:  LIPITOR Take 80 mg by mouth every evening.   cetirizine 10 MG tablet Commonly known as:  ZYRTEC Take 10 mg by mouth daily as needed for allergies.   cholecalciferol 1000 units tablet Commonly known as:  VITAMIN D Take 1,000 Units by mouth daily.   VITAMIN D3 SUPER STRENGTH 2000 units Tabs Generic drug:  Cholecalciferol   cyclobenzaprine 10 MG tablet Commonly known as:  FLEXERIL   diclofenac sodium 1 % Gel Commonly known as:  VOLTAREN   dorzolamide 2 % ophthalmic solution Commonly known as:  TRUSOPT Place 1 drop into both eyes 2 (two) times daily.   ibuprofen 800 MG tablet Commonly known  as:  ADVIL,MOTRIN Take 1 tablet (800 mg total) by mouth every 8 (eight) hours as needed for moderate pain.   levothyroxine 200 MCG tablet Commonly known as:  SYNTHROID, LEVOTHROID Take 200 mcg by mouth daily before breakfast.   tamsulosin 0.4 MG Caps capsule Commonly known as:  FLOMAX Take 0.4 mg by mouth 2 (two) times daily.       Allergies:  Allergies  Allergen Reactions  . Cortisone     inj to shoulder - paralysis  . Neurontin [Gabapentin]     Altered mental state  . Other     Steroids - paralysis     Family History: Family History  Problem Relation Age of Onset  . Prostate cancer Neg Hx   . Bladder Cancer Neg Hx   . Kidney cancer Neg Hx     Social History:  reports  that he has never smoked. He has never used smokeless tobacco. He reports that he drinks alcohol. He reports that he does not use drugs.  ROS: UROLOGY Frequent Urination?: No Hard to postpone urination?: No Burning/pain with urination?: Yes Get up at night to urinate?: No Leakage of urine?: No Urine stream starts and stops?: No Trouble starting stream?: No Do you have to strain to urinate?: No Blood in urine?: No Urinary tract infection?: No Sexually transmitted disease?: No Injury to kidneys or bladder?: No Painful intercourse?: No Weak stream?: No Erection problems?: No Penile pain?: No  Gastrointestinal Nausea?: No Vomiting?: No Indigestion/heartburn?: No Diarrhea?: No Constipation?: No  Constitutional Fever: No Night sweats?: No Weight loss?: No Fatigue?: No  Skin Skin rash/lesions?: No Itching?: No  Eyes Blurred vision?: No Double vision?: No  Ears/Nose/Throat Sore throat?: No Sinus problems?: No  Hematologic/Lymphatic Swollen glands?: No Easy bruising?: No  Cardiovascular Leg swelling?: No Chest pain?: No  Respiratory Cough?: No Shortness of breath?: No  Endocrine Excessive thirst?: No  Musculoskeletal Back pain?: No Joint pain?: No  Neurological Headaches?: No Dizziness?: No  Psychologic Depression?: No Anxiety?: No  Physical Exam: BP 131/81 (BP Location: Left Arm, Patient Position: Sitting, Cuff Size: Large)   Pulse (!) 118   Ht 5\' 10"  (1.778 m)   Wt 197 lb (89.4 kg)   BMI 28.27 kg/m   Constitutional:  Alert and oriented, No acute distress. HEENT: [place] AT, moist mucus membranes.  Trachea midline, no masses. Cardiovascular: No clubbing, cyanosis, or edema. Respiratory: Normal respiratory effort, no increased work of breathing. GI: Abdomen is soft, nontender, nondistended, no abdominal masses GU: No CVA tenderness.  Prostate 40 g, smooth with mild tenderness Lymph: No cervical or inguinal lymphadenopathy. Skin: No rashes,  bruises or suspicious lesions. Neurologic: Grossly intact, no focal deficits, moving all 4 extremities. Psychiatric: Normal mood and affect.  Laboratory Data:  Urinalysis Dipstick 3+ blood Microscopy 11-30 RBC/calcium oxalate crystals   Assessment & Plan:   62 year old male with mild pelvic symptoms.  Urinalysis today shows no pyuria but microhematuria.  A urine culture was ordered.  We discussed the possibility of a distal ureteral calculus accounting for his symptoms.  He did have bilateral nephrolithiasis on prior CT and renal ultrasound showed a 3 mm lower pole calculus.  A KUB was ordered and he will be notified with the results.  He will continue tamsulosin.  Stone protocol CT for persistent or worsening symptoms.    Abbie Sons, [place] [address], [place], [place] [address] [phone]

## 2018-02-01 ENCOUNTER — Telehealth: Payer: Self-pay | Admitting: Family Medicine

## 2018-02-01 DIAGNOSIS — R1030 Lower abdominal pain, unspecified: Secondary | ICD-10-CM

## 2018-02-01 NOTE — Telephone Encounter (Signed)
-----   Message from Abbie Sons, MD sent at 01/31/2018 10:17 AM EDT ----- No definite stone seen on KUB.  If symptoms persist would recommend a stone protocol CT of the abdomen and pelvis.

## 2018-02-01 NOTE — Telephone Encounter (Signed)
LMOM for patient to return call.

## 2018-02-03 NOTE — Telephone Encounter (Signed)
Patient notified and wants to get the CT scan.

## 2018-02-04 NOTE — Telephone Encounter (Signed)
Patient notified order has been placed for CT

## 2018-02-04 NOTE — Telephone Encounter (Signed)
Order was entered 

## 2018-02-10 ENCOUNTER — Ambulatory Visit
Admission: RE | Admit: 2018-02-10 | Discharge: 2018-02-10 | Disposition: A | Discharge status: Home / Self Care | Source: Ambulatory Visit | Attending: Urology | Admitting: Urology

## 2018-02-10 DIAGNOSIS — K5730 Diverticulosis of large intestine without perforation or abscess without bleeding: Secondary | ICD-10-CM | POA: Diagnosis not present

## 2018-02-10 DIAGNOSIS — R1030 Lower abdominal pain, unspecified: Secondary | ICD-10-CM

## 2018-02-10 DIAGNOSIS — N202 Calculus of kidney with calculus of ureter: Secondary | ICD-10-CM | POA: Insufficient documentation

## 2018-02-11 ENCOUNTER — Telehealth: Payer: Self-pay | Admitting: Family Medicine

## 2018-02-11 NOTE — Telephone Encounter (Signed)
Patient notified and voiced understanding.

## 2018-02-11 NOTE — Telephone Encounter (Signed)
-----   Message from Abbie Sons, MD sent at 02/10/2018  4:05 PM EDT ----- CT shows a small 2 mm calculus in the right distal ureter without obstruction.  This is most likely accounting for his symptoms and microhematuria.  There is a greater than 90% chance that it will pass spontaneously.  The other option would be ureteroscopic removal.

## 2018-03-17 ENCOUNTER — Encounter: Payer: Self-pay | Admitting: Urology

## 2018-03-17 ENCOUNTER — Ambulatory Visit (INDEPENDENT_AMBULATORY_CARE_PROVIDER_SITE_OTHER): Admitting: Urology

## 2018-03-17 VITALS — BP 128/83 | HR 100 | Ht 70.0 in | Wt 197.0 lb

## 2018-03-17 DIAGNOSIS — N201 Calculus of ureter: Secondary | ICD-10-CM

## 2018-03-17 LAB — URINALYSIS, COMPLETE
Bilirubin, UA: NEGATIVE
GLUCOSE, UA: NEGATIVE
KETONES UA: NEGATIVE
Leukocytes, UA: NEGATIVE
NITRITE UA: NEGATIVE
PROTEIN UA: NEGATIVE
SPEC GRAV UA: 1.015 (ref 1.005–1.030)
UUROB: 0.2 mg/dL (ref 0.2–1.0)
pH, UA: 6.5 (ref 5.0–7.5)

## 2018-03-17 LAB — MICROSCOPIC EXAMINATION
Epithelial Cells (non renal): NONE SEEN /hpf (ref 0–10)
WBC UA: NONE SEEN /HPF (ref 0–5)

## 2018-03-17 NOTE — Progress Notes (Signed)
03/17/2018 9:23 AM   Adrian Marshall January 16, 1956 [id number]  Referring provider: Ledell Noss [place], [place] [address]  Chief Complaint  Patient presents with  . Follow-up   Urologic history: 1. BPH with LUTS -TURP April 2019  2.  Stone disease -Left ureteroscopic stone removal 08/2017 -CT October 2019 2 mm distal ureteral calculus; no nephrolithiasis   HPI: 62 year old male presents for follow-up.  He was last seen October 2018 complaining of perineal and rectal discomfort.  He was found to have microhematuria.  His symptoms persisted and a CT was performed which showed a nonobstructing 2 mm distal ureteral calculus.  He is symptoms are still present but have improved.  He is straining his urine and not aware of passing a stone.  He continues to have no bothersome lower urinary tract symptoms status post TURP  PMH: Past Medical History:  Diagnosis Date  . Arthritis    NECK  . BPH (benign prostatic hyperplasia)   . Cancer (HCC)    SKIN CANCER-BASAL  . Coronary artery disease   . History of kidney stones   . Hypercholesterolemia   . Hypothyroidism   . Renal disorder    kidney stones  . Sleep apnea    USES CPAP  . Thyroid disease     Surgical History: Past Surgical History:  Procedure Laterality Date  . ANKLE SURGERY    . CYSTOSCOPY WITH RETROGRADE PYELOGRAM, URETEROSCOPY AND STENT PLACEMENT Left 08/16/2017   Procedure: CYSTOSCOPY WITH RETROGRADE PYELOGRAM, URETEROSCOPY AND STENT PLACEMENT;  Surgeon: Hollice Espy, MD;  Location: [facility];  Service: Urology;  Laterality: Left;  . HERNIA REPAIR    . KNEE SURGERY    . NECK SURGERY    . SHOULDER SURGERY    . TRANSURETHRAL RESECTION OF PROSTATE N/A 07/20/2017   Procedure: TRANSURETHRAL RESECTION OF THE PROSTATE (TURP);  Surgeon: Abbie Sons, MD;  Location: [facility];  Service: Urology;  Laterality: N/A;  . URETEROSCOPY WITH HOLMIUM LASER LITHOTRIPSY Left 08/16/2017   Procedure:  URETEROSCOPY WITH HOLMIUM LASER LITHOTRIPSY;  Surgeon: Hollice Espy, MD;  Location: [facility];  Service: Urology;  Laterality: Left;  Marland Kitchen VASECTOMY      Home Medications:  Allergies as of 03/17/2018      Reactions   Cortisone    inj to shoulder - paralysis   Neurontin [gabapentin]    Altered mental state   Other    Steroids - paralysis       Medication List        Accurate as of 03/17/18  9:23 AM. Always use your most recent med list.          atorvastatin 80 MG tablet Commonly known as:  LIPITOR Take 80 mg by mouth every evening.   cetirizine 10 MG tablet Commonly known as:  ZYRTEC Take 10 mg by mouth daily as needed for allergies.   cholecalciferol 1000 units tablet Commonly known as:  VITAMIN D Take 1,000 Units by mouth daily.   VITAMIN D3 SUPER STRENGTH 50 MCG (2000 UT) Tabs Generic drug:  Cholecalciferol   dorzolamide 2 % ophthalmic solution Commonly known as:  TRUSOPT Place 1 drop into both eyes 2 (two) times daily.   levothyroxine 200 MCG tablet Commonly known as:  SYNTHROID, LEVOTHROID Take 200 mcg by mouth daily before breakfast.   tamsulosin 0.4 MG Caps capsule Commonly known as:  FLOMAX Take 0.4 mg by mouth 2 (two) times daily.       Allergies:  Allergies  Allergen Reactions  . Cortisone     inj to shoulder - paralysis  . Neurontin [Gabapentin]     Altered mental state  . Other     Steroids - paralysis     Family History: Family History  Problem Relation Age of Onset  . Prostate cancer Neg Hx   . Bladder Cancer Neg Hx   . Kidney cancer Neg Hx     Social History:  reports that he has never smoked. He has never used smokeless tobacco. He reports that he drinks alcohol. He reports that he does not use drugs.  ROS: UROLOGY Frequent Urination?: No Hard to postpone urination?: No Burning/pain with urination?: No Get up at night to urinate?: No Leakage of urine?: No Urine stream starts and stops?: No Trouble starting stream?: No Do  you have to strain to urinate?: No Blood in urine?: Yes Urinary tract infection?: No Sexually transmitted disease?: No Injury to kidneys or bladder?: No Painful intercourse?: No Weak stream?: No Erection problems?: No Penile pain?: No  Gastrointestinal Nausea?: No Vomiting?: No Indigestion/heartburn?: No Diarrhea?: No Constipation?: No  Constitutional Fever: No Night sweats?: No Weight loss?: No Fatigue?: No  Skin Skin rash/lesions?: No Itching?: No  Eyes Blurred vision?: No Double vision?: No  Ears/Nose/Throat Sore throat?: No Sinus problems?: No  Hematologic/Lymphatic Swollen glands?: No Easy bruising?: No  Cardiovascular Leg swelling?: No Chest pain?: No  Respiratory Cough?: No Shortness of breath?: No  Endocrine Excessive thirst?: No  Musculoskeletal Back pain?: No Joint pain?: Yes  Neurological Headaches?: No Dizziness?: No  Psychologic Depression?: No Anxiety?: No  Physical Exam: BP 128/83 (BP Location: Left Arm, Patient Position: Sitting, Cuff Size: Large)   Pulse 100   Ht 5\' 10"  (1.778 m)   Wt 197 lb (89.4 kg)   BMI 28.27 kg/m   Constitutional:  Alert and oriented, No acute distress. HEENT: [place] AT, moist mucus membranes.  Trachea midline, no masses. Cardiovascular: No clubbing, cyanosis, or edema. Respiratory: Normal respiratory effort, no increased work of breathing. Skin: No rashes, bruises or suspicious lesions. Neurologic: Grossly intact, no focal deficits, moving all 4 extremities. Psychiatric: Normal mood and affect.  Laboratory Data:  Urinalysis Dipstick 2+ blood/microscopy 3-10 RBC   Assessment & Plan:   62 year old male doing well status post TURP.   He is not aware of passing his distal ureteral calculus.  He still has microhematuria and mild symptoms.  Will schedule a follow-up CT after the first of the year and if it is still present he would like to proceed with ureteroscopic removal.  He will call earlier  should he develop symptomatic colic.  He would like to continue annual PSA screening and will be due after 06/30/2018   Abbie Sons, [place] [address], [place], [place] [address] [phone]

## 2018-03-31 ENCOUNTER — Emergency Department (HOSPITAL_COMMUNITY)
Admission: EM | Admit: 2018-03-31 | Discharge: 2018-03-31 | Disposition: A | Discharge status: Home / Self Care | Attending: Emergency Medicine | Admitting: Emergency Medicine

## 2018-03-31 ENCOUNTER — Encounter (HOSPITAL_COMMUNITY): Payer: Self-pay | Admitting: Emergency Medicine

## 2018-03-31 ENCOUNTER — Emergency Department (HOSPITAL_COMMUNITY)

## 2018-03-31 ENCOUNTER — Other Ambulatory Visit: Payer: Self-pay

## 2018-03-31 DIAGNOSIS — N23 Unspecified renal colic: Secondary | ICD-10-CM

## 2018-03-31 DIAGNOSIS — N201 Calculus of ureter: Secondary | ICD-10-CM

## 2018-03-31 DIAGNOSIS — E039 Hypothyroidism, unspecified: Secondary | ICD-10-CM | POA: Diagnosis not present

## 2018-03-31 DIAGNOSIS — Z79899 Other long term (current) drug therapy: Secondary | ICD-10-CM | POA: Insufficient documentation

## 2018-03-31 DIAGNOSIS — I2510 Atherosclerotic heart disease of native coronary artery without angina pectoris: Secondary | ICD-10-CM | POA: Insufficient documentation

## 2018-03-31 DIAGNOSIS — Z7982 Long term (current) use of aspirin: Secondary | ICD-10-CM | POA: Diagnosis not present

## 2018-03-31 DIAGNOSIS — R1084 Generalized abdominal pain: Secondary | ICD-10-CM | POA: Diagnosis not present

## 2018-03-31 DIAGNOSIS — R109 Unspecified abdominal pain: Secondary | ICD-10-CM

## 2018-03-31 LAB — I-STAT CHEM 8, ED
BUN: 14 mg/dL (ref 8–23)
CALCIUM ION: 1.17 mmol/L (ref 1.15–1.40)
Chloride: 106 mmol/L (ref 98–111)
Creatinine, Ser: 1.4 mg/dL — ABNORMAL HIGH (ref 0.61–1.24)
GLUCOSE: 195 mg/dL — AB (ref 70–99)
HCT: 46 % (ref 39.0–52.0)
Hemoglobin: 15.6 g/dL (ref 13.0–17.0)
Potassium: 3.8 mmol/L (ref 3.5–5.1)
Sodium: 139 mmol/L (ref 135–145)
TCO2: 21 mmol/L — AB (ref 22–32)

## 2018-03-31 LAB — URINALYSIS, ROUTINE W REFLEX MICROSCOPIC
BILIRUBIN URINE: NEGATIVE
Bacteria, UA: NONE SEEN
GLUCOSE, UA: NEGATIVE mg/dL
KETONES UR: NEGATIVE mg/dL
LEUKOCYTES UA: NEGATIVE
NITRITE: NEGATIVE
PROTEIN: NEGATIVE mg/dL
Specific Gravity, Urine: 1.006 (ref 1.005–1.030)
pH: 7 (ref 5.0–8.0)

## 2018-03-31 MED ORDER — HYDROMORPHONE HCL 2 MG/ML IJ SOLN
2.0000 mg | Freq: Once | INTRAMUSCULAR | Status: AC
  Administered 2018-03-31: 2 mg via INTRAMUSCULAR
  Filled 2018-03-31: qty 1

## 2018-03-31 MED ORDER — OXYCODONE-ACETAMINOPHEN 5-325 MG PO TABS
2.0000 | ORAL_TABLET | Freq: Once | ORAL | Status: AC
  Administered 2018-03-31: 2 via ORAL
  Filled 2018-03-31: qty 2

## 2018-03-31 MED ORDER — ONDANSETRON 8 MG PO TBDP
8.0000 mg | ORAL_TABLET | Freq: Once | ORAL | Status: AC
  Administered 2018-03-31: 8 mg via ORAL
  Filled 2018-03-31: qty 1

## 2018-03-31 MED ORDER — ONDANSETRON 4 MG PO TBDP: 4.0000 mg | ORAL_TABLET | Freq: Three times a day (TID) | ORAL | 0 refills | Status: DC | PRN

## 2018-03-31 MED ORDER — OXYCODONE-ACETAMINOPHEN 5-325 MG PO TABS: ORAL_TABLET | ORAL | 0 refills | Status: DC

## 2018-03-31 MED ORDER — FENTANYL CITRATE (PF) 100 MCG/2ML IJ SOLN
100.0000 ug | Freq: Once | INTRAMUSCULAR | Status: AC
  Administered 2018-03-31: 100 ug via INTRAMUSCULAR
  Filled 2018-03-31: qty 2

## 2018-03-31 NOTE — Discharge Instructions (Addendum)
Your CT scan showed an incidental finding(s):  "Few punctate lung nodules at the right lung base are indeterminate. No follow-up needed if patient is low-risk (and has no known or suspected primary neoplasm). Non-contrast chest CT can be considered in 12 months if patient is high-risk. This recommendation follows the consensus statement: Guidelines for Management of Incidental Pulmonary Nodules Detected on CT Images: From the Fleischner Society 2017; Radiology 2017; [phone]."  Your regular medical doctor can follow up these findings. Take the prescriptions as directed. Continue to take your Flomax as previously directed. Call your Urologist tomorrow to schedule a follow up appointment within the next 2 days.  Return to the Emergency Department immediately if worsening.

## 2018-03-31 NOTE — ED Triage Notes (Signed)
Pt c/o right flank pain that he feels is a kidney stone, pt has hx of same, pt reports decreased urination today

## 2018-03-31 NOTE — ED Provider Notes (Signed)
[facility] EMERGENCY DEPARTMENT Provider Note   CSN: [id number] Arrival date & time: 03/31/18  1334     History   Chief Complaint Chief Complaint  Patient presents with  . Flank Pain    HPI Adrian Marshall is a 62 y.o. male.  HPI  Pt was seen at 1350. Per pt, c/o sudden onset and persistence of constant right sided flank "pain" that began yesterday.  Pt describes the pain as "like my last kidney stone," and radiating into the right side of his abd.  Has been associated with nausea. States he was dx with 55mm distal right kidney stone 2 weeks ago, and does not believe he has passed it.   Denies testicular pain/swelling, no dysuria/hematuria, no abd pain, no vomiting/diarrhea, no black or blood in stools, no CP/SOB, no fevers, no rash.    Past Medical History:  Diagnosis Date  . Arthritis    NECK  . BPH (benign prostatic hyperplasia)   . Cancer (HCC)    SKIN CANCER-BASAL  . Coronary artery disease   . History of kidney stones   . Hypercholesterolemia   . Hypothyroidism   . Renal disorder    kidney stones  . Sleep apnea    USES CPAP  . Thyroid disease     Patient Active Problem List   Diagnosis Date Noted  . Ureteral calculus 08/15/2017  . S/P TURP 07/20/2017  . Nephrolithiasis 06/17/2017    Past Surgical History:  Procedure Laterality Date  . ANKLE SURGERY    . CYSTOSCOPY WITH RETROGRADE PYELOGRAM, URETEROSCOPY AND STENT PLACEMENT Left 08/16/2017   Procedure: CYSTOSCOPY WITH RETROGRADE PYELOGRAM, URETEROSCOPY AND STENT PLACEMENT;  Surgeon: Hollice Espy, MD;  Location: [facility];  Service: Urology;  Laterality: Left;  . HERNIA REPAIR    . KNEE SURGERY    . NECK SURGERY    . SHOULDER SURGERY    . TRANSURETHRAL RESECTION OF PROSTATE N/A 07/20/2017   Procedure: TRANSURETHRAL RESECTION OF THE PROSTATE (TURP);  Surgeon: Abbie Sons, MD;  Location: [facility];  Service: Urology;  Laterality: N/A;  . URETEROSCOPY WITH HOLMIUM LASER LITHOTRIPSY Left 08/16/2017   Procedure: URETEROSCOPY WITH HOLMIUM LASER LITHOTRIPSY;  Surgeon: Hollice Espy, MD;  Location: [facility];  Service: Urology;  Laterality: Left;  Marland Kitchen VASECTOMY          Home Medications    Prior to Admission medications   Medication Sig Start Date End Date Taking? Authorizing Provider  acetaminophen (TYLENOL) 325 MG tablet Take by mouth every 6 (six) hours as needed for moderate pain or headache.   Yes [provider]  aspirin EC 81 MG tablet Take 81 mg by mouth daily.   Yes [provider]  atorvastatin (LIPITOR) 80 MG tablet Take 80 mg by mouth every evening.    Yes [provider]  dorzolamide (TRUSOPT) 2 % ophthalmic solution Place 1 drop into both eyes 2 (two) times daily. 07/19/17  Yes [provider]  levothyroxine (SYNTHROID, LEVOTHROID) 200 MCG tablet Take 200 mcg by mouth daily before breakfast.   Yes [provider]  tamsulosin (FLOMAX) 0.4 MG CAPS capsule Take 0.4 mg by mouth 2 (two) times daily.    Yes [provider]    Family History Family History  Problem Relation Age of Onset  . Prostate cancer Neg Hx   . Bladder Cancer Neg Hx   . Kidney cancer Neg Hx     Social History Social History   Tobacco Use  . Smoking status:  Never Smoker  . Smokeless tobacco: Never Used  Substance Use Topics  . Alcohol use: Yes    Comment: RARE  . Drug use: No     Allergies   Cortisone; Neurontin [gabapentin]; and Other   Review of Systems Review of Systems ROS: Statement: All systems negative except as marked or noted in the HPI; Constitutional: Negative for fever and chills. ; ; Eyes: Negative for eye pain, redness and discharge. ; ; ENMT: Negative for ear pain, hoarseness, nasal congestion, sinus pressure and sore throat. ; ; Cardiovascular: Negative for chest pain, palpitations, diaphoresis, dyspnea and peripheral edema. ; ; Respiratory: Negative for cough, wheezing and stridor. ; ; Gastrointestinal: +nausea. Negative for  vomiting, diarrhea, abdominal pain, blood in stool, hematemesis, jaundice and rectal bleeding. . ; ; Genitourinary: Negative for dysuria and hematuria. ; ; Genital:  No penile drainage or rash, no testicular pain or swelling, no scrotal rash or swelling. ;; Musculoskeletal: +back pain. Negative for neck pain. Negative for swelling and trauma.; ; Skin: Negative for pruritus, rash, abrasions, blisters, bruising and skin lesion.; ; Neuro: Negative for headache, lightheadedness and neck stiffness. Negative for weakness, altered level of consciousness, altered mental status, extremity weakness, paresthesias, involuntary movement, seizure and syncope.       Physical Exam Updated Vital Signs BP (!) 137/109 (BP Location: Right Arm)   Pulse (!) 138   Temp 97.8 F (36.6 C) (Temporal)   Resp 12   Ht 5\' 10"  (1.778 m)   Wt 88.1 kg   SpO2 98%   BMI 27.85 kg/m   Physical Exam 1355: Physical examination:  Nursing notes reviewed; Vital signs and O2 SAT reviewed;  Constitutional: Well developed, Well nourished, Well hydrated, Uncomfortable appearing.; Head:  Normocephalic, atraumatic; Eyes: EOMI, PERRL, No scleral icterus; ENMT: Mouth and pharynx normal, Mucous membranes moist; Neck: Supple, Full range of motion, No lymphadenopathy; Cardiovascular: Regular rate and rhythm, No gallop; Respiratory: Breath sounds clear & equal bilaterally, No wheezes.  Speaking full sentences with ease, Normal respiratory effort/excursion; Chest: Nontender, Movement normal; Abdomen: Soft, Nontender, Nondistended, Normal bowel sounds; Genitourinary: No CVA tenderness; Spine:  No midline CS, TS, LS tenderness.;;  Extremities: Peripheral pulses normal, No tenderness, No edema, No calf edema or asymmetry.; Neuro: AA&Ox3, Major CN grossly intact.  Speech clear. No gross focal motor or sensory deficits in extremities.; Skin: Color normal, Warm, Dry.   ED Treatments / Results  Labs (all labs ordered are listed, but only abnormal  results are displayed)   EKG None  Radiology   Procedures Procedures (including critical care time)  Medications Ordered in ED Medications  HYDROmorphone (DILAUDID) injection 2 mg (2 mg Intramuscular Given 03/31/18 1405)  ondansetron (ZOFRAN-ODT) disintegrating tablet 8 mg (8 mg Oral Given 03/31/18 1406)  fentaNYL (SUBLIMAZE) injection 100 mcg (100 mcg Intramuscular Given 03/31/18 1502)     Initial Impression / Assessment and Plan / ED Course  I have reviewed the triage vital signs and the nursing notes.  Pertinent labs & imaging results that were available during my care of the patient were reviewed by me and considered in my medical decision making (see chart for details).  MDM Reviewed: previous chart, nursing note and vitals Reviewed previous: labs Interpretation: labs and CT scan    Results for orders placed or performed during the hospital encounter of 03/31/18  Urinalysis, Routine w reflex microscopic  Result Value Ref Range   Color, Urine YELLOW YELLOW   APPearance CLEAR CLEAR   Specific Gravity, Urine 1.006 1.005 -  1.030   pH 7.0 5.0 - 8.0   Glucose, UA NEGATIVE NEGATIVE mg/dL   Hgb urine dipstick LARGE (A) NEGATIVE   Bilirubin Urine NEGATIVE NEGATIVE   Ketones, ur NEGATIVE NEGATIVE mg/dL   Protein, ur NEGATIVE NEGATIVE mg/dL   Nitrite NEGATIVE NEGATIVE   Leukocytes, UA NEGATIVE NEGATIVE   RBC / HPF 0-5 0 - 5 RBC/hpf   WBC, UA 0-5 0 - 5 WBC/hpf   Bacteria, UA NONE SEEN NONE SEEN  I-stat Chem 8, ED  Result Value Ref Range   Sodium 139 135 - 145 mmol/L   Potassium 3.8 3.5 - 5.1 mmol/L   Chloride 106 98 - 111 mmol/L   BUN 14 8 - 23 mg/dL   Creatinine, Ser 1.40 (H) 0.61 - 1.24 mg/dL   Glucose, Bld 195 (H) 70 - 99 mg/dL   Calcium, Ion 1.17 1.15 - 1.40 mmol/L   TCO2 21 (L) 22 - 32 mmol/L   Hemoglobin 15.6 13.0 - 17.0 g/dL   HCT 46.0 39.0 - 52.0 %   Ct Renal Stone Study Result Date: 03/31/2018 CLINICAL DATA:  62 year old with right flank pain. EXAM:  CT ABDOMEN AND PELVIS WITHOUT CONTRAST TECHNIQUE: Multidetector CT imaging of the abdomen and pelvis was performed following the standard protocol without IV contrast. COMPARISON:  08/15/2017 and 08/16/2008 FINDINGS: Lower chest: Small focus of pleural thickening along the right major fissure on sequence 4, image 4 is stable since 2010. Few punctate nodular densities at the right lung base on sequence 4, image 16 are nonspecific and some of these may be new. There is no significant consolidation or pleural fluid at the lung bases. Hepatobiliary: Normal appearance of the liver and gallbladder. No biliary dilatation. Pancreas: Unremarkable. No pancreatic ductal dilatation or surrounding inflammatory changes. Spleen: Normal in size without focal abnormality. Adrenals/Urinary Tract: Normal adrenal glands. Normal appearance of the left kidney without stones or suspicious renal lesion. Urinary bladder is decompressed. 4 mm stone in the distal right ureter with mild dilatation of the right ureter. Mild right hydronephrosis. Mild right perinephric stranding which is relatively chronic but there may be a mildly increased perinephric edema. No other renal or ureter calculi. Stomach/Bowel: Diverticulosis in the sigmoid colon without acute inflammatory changes. Normal appendix. No evidence for bowel dilatation or focal bowel inflammation. Vascular/Lymphatic: Atherosclerotic calcifications in the aorta and iliac arteries without aneurysm. There may be a calcified intimal flap along the right side of the infrarenal abdominal aorta which is similar to the prior examination. No lymph node enlargement in the abdomen or pelvis. Reproductive: Prostate is unremarkable with a few calcifications. Other: No free fluid.  Negative for free air. Musculoskeletal: No acute bone abnormality. IMPRESSION: 1. Mild right hydroureteronephrosis secondary to a 4 mm stone in the distal right ureter. Mild right perinephric edema. 2. Few punctate lung  nodules at the right lung base are indeterminate. No follow-up needed if patient is low-risk (and has no known or suspected primary neoplasm). Non-contrast chest CT can be considered in 12 months if patient is high-risk. This recommendation follows the consensus statement: Guidelines for Management of Incidental Pulmonary Nodules Detected on CT Images: From the Fleischner Society 2017; Radiology 2017; [id number]. 3.  Aortic atherosclerosis. Electronically Signed   By: Markus Daft M.D.   On: 03/31/2018 14:46    Results for ELAND, LAMANTIA (MRN [id number]) as of 03/31/2018 15:14  Ref. Range 11/03/2015 11:00 07/22/2017 08:25 08/15/2017 00:08 08/15/2017 20:36 03/31/2018 14:14  BUN Latest Ref Range: 8 - 23 mg/dL 14 16  20 21 (H) 14  Creatinine Latest Ref Range: 0.61 - 1.24 mg/dL 1.26 (H) 1.40 (H) 1.28 (H) 1.66 (H) 1.40 (H)    1645:  Pt has tol PO well while in the ED without N/V.  Abd remains benign, VSS. CT as above. BUN/Cr per baseline. No UTI on Udip. Pt states he feels better and wants to go home now. Tx symptomatically, f/u Uro MD. Dx and testing d/w pt and family.  Questions answered.  Verb understanding, agreeable to d/c home with outpt f/u.       Final Clinical Impressions(s) / ED Diagnoses   Final diagnoses:  None    ED Discharge Orders    None       Francine Graven, DO 04/05/18 [id number]

## 2018-04-01 ENCOUNTER — Telehealth: Payer: Self-pay | Admitting: Urology

## 2018-04-01 NOTE — Telephone Encounter (Signed)
Pt LMOM that he had imaging done in [place] and kidney stone has doubled in size and wants to know if he should move CT appt up from 04/19/18.  Please advise pt [phone]

## 2018-04-01 NOTE — Telephone Encounter (Signed)
Dr. Bernardo Heater,  Please advise on message below

## 2018-04-01 NOTE — Telephone Encounter (Signed)
Dr. Bernardo Heater,  Amy spoke with pt and he does want to move forward with the ureteroscopic but she needs orders

## 2018-04-01 NOTE — Telephone Encounter (Signed)
There is no need to move the CT because he had a CT done on 12/18 and the stone is still present.  Would recommend scheduling ureteroscopic removal.  If he desires to proceed I will give the scheduling sheet to Amy

## 2018-04-02 ENCOUNTER — Telehealth: Payer: Self-pay | Admitting: Radiology

## 2018-04-02 ENCOUNTER — Other Ambulatory Visit: Payer: Self-pay | Admitting: Radiology

## 2018-04-02 DIAGNOSIS — N201 Calculus of ureter: Secondary | ICD-10-CM

## 2018-04-02 DIAGNOSIS — G4730 Sleep apnea, unspecified: Secondary | ICD-10-CM | POA: Insufficient documentation

## 2018-04-02 LAB — URINE CULTURE: Culture: NO GROWTH

## 2018-04-02 NOTE — Telephone Encounter (Signed)
Discussed the [facility] Information form below with patient over the phone.  [place], [place], [place] [address] Telephone: [phone] Fax: [phone]   Thank you for choosing [facility] for your upcoming surgery!  We are always here to assist in your urological needs.  Please read the following information with specific details for your upcoming appointments related to your surgery. Please contact Jeven Topper at [phone] Option 3 with any questions.  The Name of Your Surgery: Right ureteroscopy, laser lithotripsy, stone removal, possible right ureteral stent placement Your Surgery Date: 05/11/2018 Your Surgeon: John Giovanni  Please call Same Day Surgery at [phone] between the hours of 1pm-3pm one day prior to your surgery. They will inform you of the time to arrive at Same Day Surgery which is located on the second floor of the [facility].   You will receive a call from the [facility] office regarding your appointment with them.  The Pre-Admission Testing office is located at [place], on the first floor of the [facility] in [place] (office is to the right as you enter through the [employer] of the [facility]). Please have all medications you are currently taking and your insurance card available.   Patient was advised to have nothing to eat or drink after midnight the night prior to surgery except that he may have only water until 2 hours before surgery with nothing to drink within 2 hours of surgery.  The patient states he currently takes aspirin 81mg  & was informed to hold medication for 7 days prior to surgery beginning on 05/04/2018. Patient's questions were answered and he expressed understanding of these instructions.

## 2018-04-02 NOTE — Telephone Encounter (Signed)
Orders were completed

## 2018-04-19 ENCOUNTER — Ambulatory Visit

## 2018-04-27 ENCOUNTER — Encounter
Admission: RE | Admit: 2018-04-27 | Discharge: 2018-04-27 | Disposition: A | Discharge status: Home / Self Care | Source: Ambulatory Visit | Attending: Urology | Admitting: Urology

## 2018-04-27 ENCOUNTER — Other Ambulatory Visit: Payer: Self-pay

## 2018-04-27 DIAGNOSIS — Z01810 Encounter for preprocedural cardiovascular examination: Secondary | ICD-10-CM | POA: Diagnosis not present

## 2018-04-27 DIAGNOSIS — Z01818 Encounter for other preprocedural examination: Secondary | ICD-10-CM | POA: Diagnosis present

## 2018-04-27 DIAGNOSIS — E785 Hyperlipidemia, unspecified: Secondary | ICD-10-CM | POA: Insufficient documentation

## 2018-04-27 LAB — CBC
HCT: 48.7 % (ref 39.0–52.0)
Hemoglobin: 16.1 g/dL (ref 13.0–17.0)
MCH: 29.6 pg (ref 26.0–34.0)
MCHC: 33.1 g/dL (ref 30.0–36.0)
MCV: 89.5 fL (ref 80.0–100.0)
PLATELETS: 254 10*3/uL (ref 150–400)
RBC: 5.44 MIL/uL (ref 4.22–5.81)
RDW: 12.2 % (ref 11.5–15.5)
WBC: 6.3 10*3/uL (ref 4.0–10.5)
nRBC: 0 % (ref 0.0–0.2)

## 2018-04-27 NOTE — Patient Instructions (Addendum)
Your procedure is scheduled on: 05/11/18 Tues Report to Same Day Surgery 2nd floor medical mall [facility] Entrance-take elevator on left to 2nd floor.  Check in with surgery information desk.) To find out your arrival time please call [phone] between 1PM - 3PM on 05/10/18 Mon  Remember: Instructions that are not followed completely may result in serious medical risk, up to and including death, or upon the discretion of your surgeon and anesthesiologist your surgery may need to be rescheduled.    _x___ 1. Do not eat food after midnight the night before your procedure. You may drink clear liquids up to 2 hours before you are scheduled to arrive at the hospital for your procedure.  Do not drink clear liquids within 2 hours of your scheduled arrival to the hospital.  Clear liquids include  --Water or Apple juice without pulp  --Clear carbohydrate beverage such as ClearFast or Gatorade  --Black Coffee or Clear Tea (No milk, no creamers, do not add anything to                  the coffee or Tea Type 1 and type 2 diabetics should only drink water.   ____Ensure clear carbohydrate drink on the way to the hospital for bariatric patients  ____Ensure clear carbohydrate drink 3 hours before surgery for Dr Dwyane Luo patients if physician instructed.   No gum chewing or hard candies.     __x__ 2. No Alcohol for 24 hours before or after surgery.   __x__3. No Smoking or e-cigarettes for 24 prior to surgery.  Do not use any chewable tobacco products for at least 6 hour prior to surgery   ____  4. Bring all medications with you on the day of surgery if instructed.    __x__ 5. Notify your doctor if there is any change in your medical condition     (cold, fever, infections).    x___6. On the morning of surgery brush your teeth with toothpaste and water.  You may rinse your mouth with mouth wash if you wish.  Do not swallow any toothpaste or mouthwash.   Do not wear jewelry, make-up, hairpins,  clips or nail polish.  Do not wear lotions, powders, or perfumes. You may wear deodorant.  Do not shave 48 hours prior to surgery. Men may shave face and neck.  Do not bring valuables to the hospital.    [facility] is not responsible for any belongings or valuables.               Contacts, dentures or bridgework may not be worn into surgery.  Leave your suitcase in the car. After surgery it may be brought to your room.  For patients admitted to the hospital, discharge time is determined by your                       treatment team.  _  Patients discharged the day of surgery will not be allowed to drive home.  You will need someone to drive you home and stay with you the night of your procedure.    Please read over the following fact sheets that you were given:   [facility] Preparing for Surgery and or MRSA Information   _x___ Take anti-hypertensive listed below, cardiac, seizure, asthma,     anti-reflux and psychiatric medicines. These include:  1. levothyroxine (SYNTHROID, LEVOTHROID) 200 MCG tablet  2.oxyCODONE-acetaminophen (PERCOCET/ROXICET) 5-325 MG tablet if needed  3.tamsulosin (FLOMAX) 0.4 MG  CAPS capsule  4.  5.  6.  ____Fleets enema or Magnesium Citrate as directed.   ____ Use CHG Soap or sage wipes as directed on instruction sheet   ____ Use inhalers on the day of surgery and bring to hospital day of surgery  ____ Stop Metformin and Janumet 2 days prior to surgery.    ____ Take 1/2 of usual insulin dose the night before surgery and none on the morning     surgery.   _x___ Follow recommendations from Cardiologist, Pulmonologist or PCP regarding          stopping Aspirin, Coumadin, Plavix ,Eliquis, Effient, or Pradaxa, and Pletal. Instructed by MD to stop aspirin 1 week before surgery.  X____Stop Anti-inflammatories such as Advil, Aleve, Ibuprofen, Motrin, Naproxen, Naprosyn, Goodies powders or aspirin products. OK to take Tylenol and                           Celebrex.   _x___ Stop supplements until after surgery.  But may continue Vitamin D, Vitamin B,       and multivitamin.   __x__ Bring C-Pap to the hospital.

## 2018-04-28 LAB — URINE CULTURE: Culture: NO GROWTH

## 2018-04-29 ENCOUNTER — Other Ambulatory Visit

## 2018-04-30 ENCOUNTER — Telehealth: Payer: Self-pay | Admitting: Radiology

## 2018-04-30 DIAGNOSIS — N201 Calculus of ureter: Secondary | ICD-10-CM

## 2018-04-30 NOTE — Telephone Encounter (Signed)
Patient reports passing stone. He saw it pass but didn't use strainer so he doesn't have it. Surgery is scheduled 05/11/2018. Please advise on plan.

## 2018-05-02 NOTE — Telephone Encounter (Signed)
Recc f/u non con ct pelvis prior to surgery date.  Order entered

## 2018-05-03 ENCOUNTER — Other Ambulatory Visit: Payer: Self-pay | Admitting: Radiology

## 2018-05-03 NOTE — Telephone Encounter (Signed)
Notified patient of need for CT prior to surgery date. Transferred patient to radiology scheduling to schedule.

## 2018-05-07 ENCOUNTER — Ambulatory Visit
Admission: RE | Admit: 2018-05-07 | Discharge: 2018-05-07 | Disposition: A | Discharge status: Home / Self Care | Source: Ambulatory Visit | Attending: Urology | Admitting: Urology

## 2018-05-07 ENCOUNTER — Telehealth: Payer: Self-pay | Admitting: Radiology

## 2018-05-07 ENCOUNTER — Other Ambulatory Visit: Payer: Self-pay | Admitting: Radiology

## 2018-05-07 DIAGNOSIS — N201 Calculus of ureter: Secondary | ICD-10-CM

## 2018-05-07 NOTE — Telephone Encounter (Signed)
Notified patient of Dr Dene Gentry note below & that surgery will be cancelled. 6 month follow up with KUB prior appt made. Patient aware. Questions answered. Patient expresses understanding of conversation.

## 2018-05-11 ENCOUNTER — Encounter: Admission: RE | Payer: Self-pay | Source: Home / Self Care

## 2018-05-11 ENCOUNTER — Ambulatory Visit: Admission: RE | Admit: 2018-05-11 | Source: Home / Self Care | Admitting: Urology

## 2018-05-11 SURGERY — CYSTOURETEROSCOPY, USING HOLMIUM LASER
Anesthesia: Choice | Laterality: Right

## 2018-11-05 ENCOUNTER — Ambulatory Visit: Admitting: Urology

## 2018-11-30 ENCOUNTER — Encounter: Payer: Self-pay | Admitting: Urology

## 2018-11-30 ENCOUNTER — Other Ambulatory Visit: Payer: Self-pay

## 2018-11-30 ENCOUNTER — Ambulatory Visit
Admission: RE | Admit: 2018-11-30 | Discharge: 2018-11-30 | Disposition: A | Discharge status: Home / Self Care | Source: Ambulatory Visit | Attending: Urology | Admitting: Urology

## 2018-11-30 ENCOUNTER — Ambulatory Visit (INDEPENDENT_AMBULATORY_CARE_PROVIDER_SITE_OTHER): Admitting: Urology

## 2018-11-30 VITALS — BP 143/84 | HR 93 | Ht 70.0 in | Wt 197.0 lb

## 2018-11-30 DIAGNOSIS — Z87442 Personal history of urinary calculi: Secondary | ICD-10-CM

## 2018-11-30 DIAGNOSIS — N201 Calculus of ureter: Secondary | ICD-10-CM | POA: Diagnosis not present

## 2018-11-30 DIAGNOSIS — N401 Enlarged prostate with lower urinary tract symptoms: Secondary | ICD-10-CM | POA: Diagnosis not present

## 2018-11-30 LAB — URINALYSIS, COMPLETE
Bilirubin, UA: NEGATIVE
Glucose, UA: NEGATIVE
Ketones, UA: NEGATIVE
Leukocytes,UA: NEGATIVE
Nitrite, UA: NEGATIVE
Protein,UA: NEGATIVE
RBC, UA: NEGATIVE
Specific Gravity, UA: 1.015 (ref 1.005–1.030)
Urobilinogen, Ur: 0.2 mg/dL (ref 0.2–1.0)
pH, UA: 5.5 (ref 5.0–7.5)

## 2018-11-30 LAB — MICROSCOPIC EXAMINATION
Bacteria, UA: NONE SEEN
RBC, Urine: NONE SEEN /hpf (ref 0–2)

## 2018-11-30 NOTE — Progress Notes (Signed)
11/30/2018 1:51 PM   Adrian Marshall [place] [birth year] [id number]  Referring provider: Ledell Noss [place],  [place] [address]  Chief Complaint  Patient presents with  . Follow-up    Urologic history: 1.  BPH with incomplete bladder emptying  -TURP 07/2017  2.  Recurrent stone disease  -Ureteroscopic removal 9 mm distal calculus 2019  HPI: 63 y.o. presents for annual follow-up.  He states he is voiding with an excellent stream.  He has continued taking tamsulosin that he gets from the [place].  The past several days he has been complaining of mild right flank pain which radiates to the right groin area.  The symptoms are intermittent without identifiable precipitating, aggravating or alleviating factors.  He passed 2 small sand-like particles earlier this week.  KUB performed today was reviewed and there are no obvious calcifications overlying the renal outlines or expected course of the ureters.  PSA done at the [facility] 05/2018 was 1.02.   PMH: Past Medical History:  Diagnosis Date  . Arthritis    NECK  . BPH (benign prostatic hyperplasia)   . Cancer (HCC)    SKIN CANCER-BASAL  . Coronary artery disease   . History of kidney stones   . Hypercholesterolemia   . Hypothyroidism   . Renal disorder    kidney stones  . Sleep apnea    USES CPAP  . Thyroid disease     Surgical History: Past Surgical History:  Procedure Laterality Date  . ANKLE SURGERY    . CYSTOSCOPY WITH RETROGRADE PYELOGRAM, URETEROSCOPY AND STENT PLACEMENT Left 08/16/2017   Procedure: CYSTOSCOPY WITH RETROGRADE PYELOGRAM, URETEROSCOPY AND STENT PLACEMENT;  Surgeon: Hollice Espy, MD;  Location: [facility] ORS;  Service: Urology;  Laterality: Left;  . HERNIA REPAIR    . KNEE SURGERY    . NECK SURGERY    . SHOULDER SURGERY    . TRANSURETHRAL RESECTION OF PROSTATE N/A 07/20/2017   Procedure: TRANSURETHRAL RESECTION OF THE PROSTATE (TURP);  Surgeon: Abbie Sons, MD;  Location: [facility]  ORS;  Service: Urology;  Laterality: N/A;  . URETEROSCOPY WITH HOLMIUM LASER LITHOTRIPSY Left 08/16/2017   Procedure: URETEROSCOPY WITH HOLMIUM LASER LITHOTRIPSY;  Surgeon: Hollice Espy, MD;  Location: [facility] ORS;  Service: Urology;  Laterality: Left;  Marland Kitchen VASECTOMY      Home Medications:  Allergies as of 11/30/2018      Reactions   Cortisone    inj to shoulder - paralysis   Neurontin [gabapentin]    Altered mental state   Other    Steroids - paralysis       Medication List       Accurate as of November 30, 2018  1:51 PM. If you have any questions, ask your nurse or doctor.        STOP taking these medications   ondansetron 4 MG disintegrating tablet Commonly known as: Zofran ODT Stopped by: Abbie Sons, MD   oxyCODONE-acetaminophen 5-325 MG tablet Commonly known as: PERCOCET/ROXICET Stopped by: Abbie Sons, MD     TAKE these medications   acetaminophen 325 MG tablet Commonly known as: TYLENOL Take 325 mg by mouth every 6 (six) hours as needed for moderate pain or headache.   All Day Allergy 10 MG tablet Generic drug: cetirizine   aspirin EC 81 MG tablet Take 81 mg by mouth daily.   atorvastatin 80 MG tablet Commonly known as: LIPITOR Take 80 mg by mouth every evening.   dorzolamide 2 %  ophthalmic solution Commonly known as: TRUSOPT Place 1 drop into both eyes 2 (two) times daily.   levothyroxine 200 MCG tablet Commonly known as: SYNTHROID Take 200 mcg by mouth daily before breakfast.   tamsulosin 0.4 MG Caps capsule Commonly known as: FLOMAX Take 0.4 mg by mouth 2 (two) times daily.   Vitamin D 50 MCG (2000 UT) tablet       Allergies:  Allergies  Allergen Reactions  . Cortisone     inj to shoulder - paralysis  . Neurontin [Gabapentin]     Altered mental state  . Other     Steroids - paralysis     Family History: Family History  Problem Relation Age of Onset  . Prostate cancer Neg Hx   . Bladder Cancer Neg Hx   . Kidney cancer Neg Hx      Social History:  reports that he has never smoked. He has never used smokeless tobacco. He reports current alcohol use. He reports that he does not use drugs.  ROS: UROLOGY Frequent Urination?: No Hard to postpone urination?: No Burning/pain with urination?: Yes Get up at night to urinate?: No Leakage of urine?: No Urine stream starts and stops?: No Trouble starting stream?: No Do you have to strain to urinate?: No Blood in urine?: Yes Urinary tract infection?: No Sexually transmitted disease?: No Injury to kidneys or bladder?: No Painful intercourse?: No Weak stream?: No Erection problems?: No Penile pain?: No  Gastrointestinal Nausea?: No Vomiting?: No Indigestion/heartburn?: No Diarrhea?: No Constipation?: No  Constitutional Fever: No Night sweats?: No Weight loss?: No Fatigue?: No  Skin Skin rash/lesions?: No Itching?: No  Eyes Blurred vision?: No Double vision?: No  Ears/Nose/Throat Sore throat?: No Sinus problems?: No  Hematologic/Lymphatic Swollen glands?: No Easy bruising?: No  Cardiovascular Leg swelling?: No Chest pain?: No  Respiratory Cough?: No Shortness of breath?: No  Endocrine Excessive thirst?: No  Musculoskeletal Back pain?: Yes Joint pain?: No  Neurological Headaches?: No Dizziness?: No  Psychologic Depression?: No Anxiety?: No  Physical Exam: BP (!) 143/84 (BP Location: Left Arm, Patient Position: Sitting, Cuff Size: Normal)   Pulse 93   Ht 5\' 10"  (1.778 m)   Wt 197 lb (89.4 kg)   BMI 28.27 kg/m   Constitutional:  Alert and oriented, No acute distress. HEENT: [place] AT, moist mucus membranes.  Trachea midline, no masses. Cardiovascular: No clubbing, cyanosis, or edema. Respiratory: Normal respiratory effort, no increased work of breathing. GU: Declined DRE today Skin: No rashes, bruises or suspicious lesions. Neurologic: Grossly intact, no focal deficits, moving all 4 extremities. Psychiatric: Normal mood  and affect.    Pertinent Imaging: Images personally reviewed Results for orders placed during the hospital encounter of 11/30/18  Abdomen 1 view (KUB)   Narrative CLINICAL DATA:  Patient with left flank pain.  EXAM: ABDOMEN - 1 VIEW  COMPARISON:  CT renal stone 03/31/2018  FINDINGS: Gas is demonstrated within nondilated loops of large and small bowel in a nonobstructed pattern. Stool throughout the colon. No discrete radiodense calculi identified within the abdomen or pelvis. Lung bases are clear.  IMPRESSION: No discrete radiodense calculi identified within the abdomen or pelvis.   Electronically Signed   By: Lovey Newcomer M.D.   On: 11/30/2018 08:49       Assessment & Plan:    - BPH with lower urinary tract symptoms Doing well status post TURP  - Personal history urinary tract stones Mild right flank/groin pain.  No obvious calculus on KUB.  He  could potentially have a small ureteral calculus.  If his pain worsens or persist over the next week he will call back and we will order a stone protocol CT.   Abbie Sons, [place] [address], [place],  [address] [phone]

## 2019-05-22 ENCOUNTER — Other Ambulatory Visit: Payer: Self-pay

## 2019-05-22 ENCOUNTER — Ambulatory Visit
Admission: EM | Admit: 2019-05-22 | Discharge: 2019-05-22 | Disposition: A | Discharge status: Home / Self Care | Attending: Emergency Medicine | Admitting: Emergency Medicine

## 2019-05-22 DIAGNOSIS — S60459A Superficial foreign body of unspecified finger, initial encounter: Secondary | ICD-10-CM

## 2019-05-22 DIAGNOSIS — M795 Residual foreign body in soft tissue: Secondary | ICD-10-CM

## 2019-05-22 MED ORDER — DOXYCYCLINE HYCLATE 100 MG PO CAPS: 100.0000 mg | ORAL_CAPSULE | Freq: Two times a day (BID) | ORAL | 0 refills | Status: DC

## 2019-05-22 NOTE — ED Triage Notes (Signed)
Pt presents with splinter in right pinky under finger nail that happened yesterday

## 2019-05-22 NOTE — ED Provider Notes (Addendum)
[facility]   QN:[id number] 05/22/19 Arrival Time: [id number]   CC: Splinter underneath fingernail  SUBJECTIVE:  Adrian Marshall is a 64 y.o. male who presents with a splinter underneath little fingernail of RT hand x 1 day.  Symptoms began after reaching into his wood pile.  Tried removing at home without relief.  Denies similar symptoms in the past.  Complains of stiffness.  Denies fever, chills, nausea, vomiting, discharge, swelling, erythema.    Tetanus updated 3 years ago  ROS: As per HPI.  All other pertinent ROS negative.     Past Medical History:  Diagnosis Date  . Arthritis    NECK  . BPH (benign prostatic hyperplasia)   . Cancer (HCC)    SKIN CANCER-BASAL  . Coronary artery disease   . History of kidney stones   . Hypercholesterolemia   . Hypothyroidism   . Renal disorder    kidney stones  . Sleep apnea    USES CPAP  . Thyroid disease    Past Surgical History:  Procedure Laterality Date  . ANKLE SURGERY    . CYSTOSCOPY WITH RETROGRADE PYELOGRAM, URETEROSCOPY AND STENT PLACEMENT Left 08/16/2017   Procedure: CYSTOSCOPY WITH RETROGRADE PYELOGRAM, URETEROSCOPY AND STENT PLACEMENT;  Surgeon: Hollice Espy, MD;  Location: [facility] ORS;  Service: Urology;  Laterality: Left;  . HERNIA REPAIR    . KNEE SURGERY    . NECK SURGERY    . SHOULDER SURGERY    . TRANSURETHRAL RESECTION OF PROSTATE N/A 07/20/2017   Procedure: TRANSURETHRAL RESECTION OF THE PROSTATE (TURP);  Surgeon: Abbie Sons, MD;  Location: [facility] ORS;  Service: Urology;  Laterality: N/A;  . URETEROSCOPY WITH HOLMIUM LASER LITHOTRIPSY Left 08/16/2017   Procedure: URETEROSCOPY WITH HOLMIUM LASER LITHOTRIPSY;  Surgeon: Hollice Espy, MD;  Location: [facility] ORS;  Service: Urology;  Laterality: Left;  Marland Kitchen VASECTOMY     Allergies  Allergen Reactions  . Cortisone     inj to shoulder - paralysis  . Neurontin [Gabapentin]     Altered mental state  . Other     Steroids - paralysis    No current  facility-administered medications on file prior to encounter.   Current Outpatient Medications on File Prior to Encounter  Medication Sig Dispense Refill  . acetaminophen (TYLENOL) 325 MG tablet Take 325 mg by mouth every 6 (six) hours as needed for moderate pain or headache.     . ALL DAY ALLERGY 10 MG tablet     . aspirin EC 81 MG tablet Take 81 mg by mouth daily.    Marland Kitchen atorvastatin (LIPITOR) 80 MG tablet Take 80 mg by mouth every evening.     . Cholecalciferol (VITAMIN D) 50 MCG (2000 UT) tablet     . dorzolamide (TRUSOPT) 2 % ophthalmic solution Place 1 drop into both eyes 2 (two) times daily.    Marland Kitchen levothyroxine (SYNTHROID, LEVOTHROID) 200 MCG tablet Take 200 mcg by mouth daily before breakfast.    . tamsulosin (FLOMAX) 0.4 MG CAPS capsule Take 0.4 mg by mouth 2 (two) times daily.      Social History   Socioeconomic History  . Marital status: Married    Spouse name: Not on file  . Number of children: Not on file  . Years of education: Not on file  . Highest education level: Not on file  Occupational History  . Not on file  Tobacco Use  . Smoking status: Never Smoker  . Smokeless tobacco: Never Used  Substance and Sexual Activity  .  Alcohol use: Yes    Comment: RARE  . Drug use: No  . Sexual activity: Yes    Birth control/protection: None  Other Topics Concern  . Not on file  Social History Narrative  . Not on file   Social Determinants of Health   Financial Resource Strain:   . Difficulty of Paying Living Expenses: Not on file  Food Insecurity:   . Worried About Charity fundraiser in the Last Year: Not on file  . Ran Out of Food in the Last Year: Not on file  Transportation Needs:   . Lack of Transportation (Medical): Not on file  . Lack of Transportation (Non-Medical): Not on file  Physical Activity:   . Days of Exercise per Week: Not on file  . Minutes of Exercise per Session: Not on file  Stress:   . Feeling of Stress : Not on file  Social Connections:   .  Frequency of Communication with Friends and Family: Not on file  . Frequency of Social Gatherings with Friends and Family: Not on file  . Attends Religious Services: Not on file  . Active Member of Clubs or Organizations: Not on file  . Attends Archivist Meetings: Not on file  . Marital Status: Not on file  Intimate Partner Violence:   . Fear of Current or Ex-Partner: Not on file  . Emotionally Abused: Not on file  . Physically Abused: Not on file  . Sexually Abused: Not on file   Family History  Problem Relation Age of Onset  . Prostate cancer Neg Hx   . Bladder Cancer Neg Hx   . Kidney cancer Neg Hx     OBJECTIVE:  Vitals:   05/22/19 1304  BP: (!) 168/93  Pulse: (!) 101  Resp: 17  Temp: 98.6 F (37 C)  TempSrc: Tympanic  SpO2: 95%     General appearance: alert; no distress Head: NCAT Lungs: Normal respiratory effort CV: Radial pulse 2+; cap refill < 2 seconds Skin: Apx 1 cm wooden splinter present underneath LT fifth digit fingernail, no obvious swelling, discharge, bleeding; NTTP over distal tip of digit Psychological: alert and cooperative; normal mood and affect  Procedure: Verbal consent obtained. Area over proximal fifth digit cleaned with alcohol swab and betadine. Apx 10 ml of lidocaine 2% without epinephrine used to obtain fifth digit digital block. Fingernail small cuts made with scissors to medial and lateral aspects of finger nail.  Cut nail gently pressed back, and using a straight kelly hemostat wooden splinter removed.  Approximately 1 cm in length.  Fingernail irrigated with normal saline.  Minimal bleeding. Patient tolerated procedure well.  Dressing applied.  No complications.  ASSESSMENT & PLAN:  1. Splinter of finger   2. Foreign body (FB) in soft tissue     Meds ordered this encounter  Medications  . doxycycline (VIBRAMYCIN) 100 MG capsule    Sig: Take 1 capsule (100 mg total) by mouth 2 (two) times daily.    Dispense:  20 capsule      Refill:  0    Order Specific Question:   Supervising Provider    Answer:   Raylene Everts [id number]   Splinter removed Wash site daily with warm water and mild soap Keep covered Take antibiotic as prescribed and to completion Follow up here or with PCP if symptoms persists Return or go to the ED if you have any new or worsening symptoms increased redness, swelling, pain, nausea, vomiting, fever, chills, etc..Marland Kitchen  Reviewed expectations re: course of current medical issues. Questions answered. Outlined signs and symptoms indicating need for more acute intervention. Patient verbalized understanding. After Visit Summary given.      Lestine Box, PA-C 05/22/19 1422

## 2019-05-22 NOTE — Discharge Instructions (Signed)
Splinter removed Wash site daily with warm water and mild soap Keep covered Take antibiotic as prescribed and to completion Follow up here or with PCP if symptoms persists Return or go to the ED if you have any new or worsening symptoms increased redness, swelling, pain, nausea, vomiting, fever, chills, etc..Marland Kitchen

## 2019-08-09 ENCOUNTER — Encounter (HOSPITAL_COMMUNITY): Payer: Self-pay | Admitting: *Deleted

## 2019-08-09 ENCOUNTER — Other Ambulatory Visit: Payer: Self-pay

## 2019-08-09 ENCOUNTER — Emergency Department (HOSPITAL_COMMUNITY)
Admission: EM | Admit: 2019-08-09 | Discharge: 2019-08-09 | Disposition: A | Discharge status: Home / Self Care | Attending: Emergency Medicine | Admitting: Emergency Medicine

## 2019-08-09 ENCOUNTER — Emergency Department (HOSPITAL_COMMUNITY)

## 2019-08-09 DIAGNOSIS — Y999 Unspecified external cause status: Secondary | ICD-10-CM | POA: Diagnosis not present

## 2019-08-09 DIAGNOSIS — M5126 Other intervertebral disc displacement, lumbar region: Secondary | ICD-10-CM

## 2019-08-09 DIAGNOSIS — E039 Hypothyroidism, unspecified: Secondary | ICD-10-CM | POA: Diagnosis not present

## 2019-08-09 DIAGNOSIS — X500XXA Overexertion from strenuous movement or load, initial encounter: Secondary | ICD-10-CM | POA: Insufficient documentation

## 2019-08-09 DIAGNOSIS — S39012A Strain of muscle, fascia and tendon of lower back, initial encounter: Secondary | ICD-10-CM | POA: Diagnosis not present

## 2019-08-09 DIAGNOSIS — S3992XA Unspecified injury of lower back, initial encounter: Secondary | ICD-10-CM | POA: Diagnosis present

## 2019-08-09 DIAGNOSIS — Y939 Activity, unspecified: Secondary | ICD-10-CM | POA: Insufficient documentation

## 2019-08-09 DIAGNOSIS — Z79899 Other long term (current) drug therapy: Secondary | ICD-10-CM | POA: Insufficient documentation

## 2019-08-09 DIAGNOSIS — Z7982 Long term (current) use of aspirin: Secondary | ICD-10-CM | POA: Insufficient documentation

## 2019-08-09 DIAGNOSIS — Y929 Unspecified place or not applicable: Secondary | ICD-10-CM | POA: Insufficient documentation

## 2019-08-09 DIAGNOSIS — I2510 Atherosclerotic heart disease of native coronary artery without angina pectoris: Secondary | ICD-10-CM | POA: Diagnosis not present

## 2019-08-09 MED ORDER — HYDROCODONE-ACETAMINOPHEN 5-325 MG PO TABS: 1.0000 | ORAL_TABLET | Freq: Four times a day (QID) | ORAL | 0 refills | Status: DC | PRN

## 2019-08-09 MED ORDER — CYCLOBENZAPRINE HCL 10 MG PO TABS
10.0000 mg | ORAL_TABLET | Freq: Two times a day (BID) | ORAL | 0 refills | Status: DC | PRN
Start: 2019-08-09 — End: 2021-02-08

## 2019-08-09 MED ORDER — DIAZEPAM 5 MG PO TABS
10.0000 mg | ORAL_TABLET | Freq: Once | ORAL | Status: AC
  Administered 2019-08-09: 10 mg via ORAL
  Filled 2019-08-09: qty 2

## 2019-08-09 MED ORDER — ACETAMINOPHEN 500 MG PO TABS
1000.0000 mg | ORAL_TABLET | Freq: Once | ORAL | Status: AC
  Administered 2019-08-09: 1000 mg via ORAL
  Filled 2019-08-09: qty 2

## 2019-08-09 NOTE — ED Triage Notes (Signed)
Pt with lower back pain since yesterday after bending to pick up something from the floor.  Pt states unable to walk since.  Denies any incont of bladder or bowels.

## 2019-08-09 NOTE — ED Provider Notes (Signed)
[facility] Provider Note   CSN: [id number] Arrival date & time: 08/09/19  1038     History Chief Complaint  Patient presents with   Back Pain    Adrian Marshall is a 64 y.o. male.  Patient with history of coronary artery disease, cervical neck surgery, arthritis presents with significant lower back pain since picking something up off the floor yesterday.  Patient felt it was a normal movement however he had significant pain afterwards no significant radiation.  Pain persisted into today and he feels weak in both his legs.  Patient feels warm sensation in his legs but no focal numbness.  No bowel or bladder changes.  Pain severe and constant.  Patient has never had lower back surgery in the past however had a small annular tear.        Past Medical History:  Diagnosis Date   Arthritis    NECK   BPH (benign prostatic hyperplasia)    Cancer (HCC)    SKIN CANCER-BASAL   Coronary artery disease    History of kidney stones    Hypercholesterolemia    Hypothyroidism    Renal disorder    kidney stones   Sleep apnea    USES CPAP   Thyroid disease     Patient Active Problem List   Diagnosis Date Noted   Sleep apnea 04/02/2018   Ureteral calculus 08/15/2017   S/P TURP 07/20/2017   Nephrolithiasis 06/17/2017    Past Surgical History:  Procedure Laterality Date   ANKLE SURGERY     CYSTOSCOPY WITH RETROGRADE PYELOGRAM, URETEROSCOPY AND STENT PLACEMENT Left 08/16/2017   Procedure: CYSTOSCOPY WITH RETROGRADE PYELOGRAM, URETEROSCOPY AND STENT PLACEMENT;  Surgeon: Hollice Espy, MD;  Location: [facility] OR[facility];  Service: Urology;  Laterality: Left;   HERNIA REPAIR     KNEE SURGERY     NECK SURGERY     SHOULDER SURGERY     TRANSURETHRAL RESECTION OF PROSTATE N/A 07/20/2017   Procedure: TRANSURETHRAL RESECTION OF THE PROSTATE (TURP);  Surgeon: Abbie Sons, MD;  Location: [facility];  Service: Urology;  Laterality: N/A;   URETEROSCOPY  WITH HOLMIUM LASER LITHOTRIPSY Left 08/16/2017   Procedure: URETEROSCOPY WITH HOLMIUM LASER LITHOTRIPSY;  Surgeon: Hollice Espy, MD;  Location: [facility] OR[facility];  Service: Urology;  Laterality: Left;   VASECTOMY         Family History  Problem Relation Age of Onset   Prostate cancer Neg Hx    Bladder Cancer Neg Hx    Kidney cancer Neg Hx     Social History   Tobacco Use   Smoking status: Never Smoker   Smokeless tobacco: Never Used  Substance Use Topics   Alcohol use: Yes    Comment: RARE   Drug use: No    Home Medications Prior to Admission medications   Medication Sig Start Date End Date Taking? Authorizing Provider  acetaminophen (TYLENOL) 325 MG tablet Take 325 mg by mouth every 6 (six) hours as needed for moderate pain or headache.     [provider]  ALL DAY ALLERGY 10 MG tablet  07/21/18   [provider]  aspirin EC 81 MG tablet Take 81 mg by mouth daily.    [provider]  atorvastatin (LIPITOR) 80 MG tablet Take 80 mg by mouth every evening.     [provider]  Cholecalciferol (VITAMIN D) 50 MCG (2000 UT) tablet  11/24/18   [provider]  cyclobenzaprine (FLEXERIL) 10 MG tablet Take 1 tablet (  10 mg total) by mouth 2 (two) times daily as needed for muscle spasms. 08/09/19   Elnora Morrison, MD  dorzolamide (TRUSOPT) 2 % ophthalmic solution Place 1 drop into both eyes 2 (two) times daily. 07/19/17   [provider]  doxycycline (VIBRAMYCIN) 100 MG capsule Take 1 capsule (100 mg total) by mouth 2 (two) times daily. 05/22/19   Wurst, [place], PA-C  HYDROcodone-acetaminophen (NORCO) 5-325 MG tablet Take 1-2 tablets by mouth every 6 (six) hours as needed for severe pain. 08/09/19   Elnora Morrison, MD  levothyroxine (SYNTHROID, LEVOTHROID) 200 MCG tablet Take 200 mcg by mouth daily before breakfast.    [provider]  tamsulosin (FLOMAX) 0.4 MG CAPS capsule Take 0.4 mg by mouth 2 (two) times daily.     [provider]    Allergies    Cortisone, Neurontin [gabapentin], and Other  Review of Systems   Review of Systems  Constitutional: Negative for chills and fever.  HENT: Negative for congestion.   Eyes: Negative for visual disturbance.  Respiratory: Negative for shortness of breath.   Cardiovascular: Negative for chest pain.  Gastrointestinal: Negative for abdominal pain and vomiting.  Genitourinary: Negative for dysuria and flank pain.  Musculoskeletal: Positive for back pain. Negative for neck pain and neck stiffness.  Skin: Negative for rash.  Neurological: Positive for weakness. Negative for light-headedness, numbness and headaches.    Physical Exam Updated Vital Signs BP 112/84 (BP Location: Left Arm)    Pulse (!) 117    Temp 98 F (36.7 C) (Oral)    Resp 16    Ht 5\' 10"  (1.778 m)    Wt 90.3 kg    SpO2 95%    BMI 28.55 kg/m   Physical Exam Vitals and nursing note reviewed.  Constitutional:      Appearance: He is well-developed.  HENT:     Head: Normocephalic and atraumatic.  Eyes:     General:        Right eye: No discharge.        Left eye: No discharge.     Conjunctiva/sclera: Conjunctivae normal.  Neck:     Trachea: No tracheal deviation.  Cardiovascular:     Rate and Rhythm: Tachycardia present.  Pulmonary:     Effort: Pulmonary effort is normal.     Breath sounds: Normal breath sounds.  Abdominal:     General: There is no distension.     Palpations: Abdomen is soft.     Tenderness: There is no abdominal tenderness. There is no guarding.  Musculoskeletal:        General: Tenderness present. No swelling.     Cervical back: Normal range of motion and neck supple.     Comments: Patient has significant paraspinal and midline tenderness lower lumbar region.  Skin:    General: Skin is warm.     Findings: No rash.  Neurological:     Mental Status: He is alert and oriented to person, place, and time.     Deep Tendon Reflexes:     Reflex Scores:       Patellar reflexes are 2+ on the right side and 2+ on the left side.      Achilles reflexes are 2+ on the right side and 2+ on the left side.    Comments: Patient has mild weakness and pain with flexion of both hips, normal strength with flexion of both knees/ankles and great toes.  Sensation intact to palpation lower extremities and major nerves.  Psychiatric:     Comments: uncomfortable     ED Results / Procedures / Treatments   Labs (all labs ordered are listed, but only abnormal results are displayed) Labs Reviewed - No data to display  EKG None  Radiology MR LUMBAR SPINE WO CONTRAST  Result Date: 08/09/2019 CLINICAL DATA:  Back pain with leg weakness. Difficulty walking. Lifting injury yesterday. EXAM: MRI LUMBAR SPINE WITHOUT CONTRAST TECHNIQUE: Multiplanar, multisequence MR imaging of the lumbar spine was performed. No intravenous contrast was administered. COMPARISON:  None. FINDINGS: Segmentation:  5 lumbar type vertebral bodies. Alignment:  Normal Vertebrae:  No fracture or primary bone lesion. Conus medullaris and cauda equina: Conus extends to the L1-2 level. Conus and cauda equina appear normal. Paraspinal and other soft tissues: Negative Disc levels: No abnormality at L3-4 or above. At L4-5, there is a shallow disc protrusion that indents the thecal sac slightly. There is mild narrowing of the lateral recesses but no definite neural compression. This could be chronic or could have been exacerbated recently. L5-S1: Normal interspace. IMPRESSION: L4-5: Shallow disc protrusion that indents the thecal sac slightly. Mild stenosis of both lateral recesses but without definite neural compression. This could be a chronic finding or could have been exacerbated recently. Findings of this sort can be asymptomatic or can be associated with back pain. Electronically Signed   By: Nelson Chimes M.D.   On: 08/09/2019 13:32    Procedures Procedures (including critical care time)  Medications  Ordered in ED Medications  diazepam (VALIUM) tablet 10 mg (10 mg Oral Given 08/09/19 1147)  acetaminophen (TYLENOL) tablet 1,000 mg (1,000 mg Oral Given 08/09/19 1147)    ED Course  I have reviewed the triage vital signs and the nursing notes.  Pertinent labs & imaging results that were available during my care of the patient were reviewed by me and considered in my medical decision making (see chart for details).    MDM Rules/Calculators/A&P                     Patient presents with acute lower back pain worsening with mild weakness in lower extremities.  Plan for her muscle relaxant, Tylenol to start for pain and MRI for further delineation.  Discussed differential including musculoskeletal strain/tear, disc herniation, other.  MRI results occult herniation at L4-L5.  Patient has no focal neuro deficits on exam.  Patient stable for outpatient follow-up with neurosurgery.   Final Clinical Impression(s) / ED Diagnoses Final diagnoses:  Lumbar strain, initial encounter  Lumbar disc herniation    Rx / DC Orders ED Discharge Orders         Ordered    cyclobenzaprine (FLEXERIL) 10 MG tablet  2 times daily PRN     08/09/19 1438    HYDROcodone-acetaminophen (NORCO) 5-325 MG tablet  Every 6 hours PRN     08/09/19 1438           Elnora Morrison, MD 08/09/19 1439

## 2019-08-09 NOTE — Discharge Instructions (Signed)
Follow-up with neurosurgery and primary doctor to arrange physical therapy and further evaluation. If you develop persistent weakness, difficulty with bowel or bladder or new concerns come the emergency department. You can take Tylenol and ibuprofen as needed for pain. Use ice as well.  For muscle spasm use heat or Flexeril. For severe pain take norco or vicodin however realize they have the potential for addiction and it can make you sleepy and has tylenol in it.  No operating machinery while taking.

## 2019-08-09 NOTE — ED Notes (Signed)
Pt transported to MRI 

## 2019-09-17 ENCOUNTER — Other Ambulatory Visit: Payer: Self-pay

## 2019-09-17 ENCOUNTER — Encounter (HOSPITAL_COMMUNITY): Payer: Self-pay

## 2019-09-17 ENCOUNTER — Ambulatory Visit (INDEPENDENT_AMBULATORY_CARE_PROVIDER_SITE_OTHER)
Admission: EM | Admit: 2019-09-17 | Discharge: 2019-09-17 | Disposition: A | Discharge status: Home / Self Care | Source: Home / Self Care

## 2019-09-17 ENCOUNTER — Ambulatory Visit (INDEPENDENT_AMBULATORY_CARE_PROVIDER_SITE_OTHER)

## 2019-09-17 ENCOUNTER — Emergency Department (HOSPITAL_COMMUNITY)
Admission: EM | Admit: 2019-09-17 | Discharge: 2019-09-17 | Disposition: A | Discharge status: Home / Self Care | Attending: Emergency Medicine | Admitting: Emergency Medicine

## 2019-09-17 ENCOUNTER — Emergency Department (HOSPITAL_COMMUNITY)

## 2019-09-17 DIAGNOSIS — Z7982 Long term (current) use of aspirin: Secondary | ICD-10-CM | POA: Insufficient documentation

## 2019-09-17 DIAGNOSIS — N4889 Other specified disorders of penis: Secondary | ICD-10-CM | POA: Insufficient documentation

## 2019-09-17 DIAGNOSIS — Z87442 Personal history of urinary calculi: Secondary | ICD-10-CM

## 2019-09-17 DIAGNOSIS — Z85828 Personal history of other malignant neoplasm of skin: Secondary | ICD-10-CM | POA: Diagnosis not present

## 2019-09-17 DIAGNOSIS — Z79899 Other long term (current) drug therapy: Secondary | ICD-10-CM | POA: Diagnosis not present

## 2019-09-17 DIAGNOSIS — R102 Pelvic and perineal pain unspecified side: Secondary | ICD-10-CM

## 2019-09-17 DIAGNOSIS — N200 Calculus of kidney: Secondary | ICD-10-CM

## 2019-09-17 DIAGNOSIS — R309 Painful micturition, unspecified: Secondary | ICD-10-CM

## 2019-09-17 DIAGNOSIS — I2510 Atherosclerotic heart disease of native coronary artery without angina pectoris: Secondary | ICD-10-CM | POA: Insufficient documentation

## 2019-09-17 DIAGNOSIS — R300 Dysuria: Secondary | ICD-10-CM | POA: Diagnosis present

## 2019-09-17 DIAGNOSIS — R3912 Poor urinary stream: Secondary | ICD-10-CM | POA: Diagnosis not present

## 2019-09-17 DIAGNOSIS — R1030 Lower abdominal pain, unspecified: Secondary | ICD-10-CM

## 2019-09-17 DIAGNOSIS — E039 Hypothyroidism, unspecified: Secondary | ICD-10-CM | POA: Insufficient documentation

## 2019-09-17 LAB — CBC WITH DIFFERENTIAL/PLATELET
Abs Immature Granulocytes: 0.03 10*3/uL (ref 0.00–0.07)
Basophils Absolute: 0.1 10*3/uL (ref 0.0–0.1)
Basophils Relative: 1 %
Eosinophils Absolute: 0.3 10*3/uL (ref 0.0–0.5)
Eosinophils Relative: 5 %
HCT: 46.3 % (ref 39.0–52.0)
Hemoglobin: 15.7 g/dL (ref 13.0–17.0)
Immature Granulocytes: 1 %
Lymphocytes Relative: 19 %
Lymphs Abs: 1.2 10*3/uL (ref 0.7–4.0)
MCH: 30.5 pg (ref 26.0–34.0)
MCHC: 33.9 g/dL (ref 30.0–36.0)
MCV: 89.9 fL (ref 80.0–100.0)
Monocytes Absolute: 0.5 10*3/uL (ref 0.1–1.0)
Monocytes Relative: 9 %
Neutro Abs: 3.9 10*3/uL (ref 1.7–7.7)
Neutrophils Relative %: 65 %
Platelets: 249 10*3/uL (ref 150–400)
RBC: 5.15 MIL/uL (ref 4.22–5.81)
RDW: 12.6 % (ref 11.5–15.5)
WBC: 6 10*3/uL (ref 4.0–10.5)
nRBC: 0 % (ref 0.0–0.2)

## 2019-09-17 LAB — POCT URINALYSIS DIP (MANUAL ENTRY)
Bilirubin, UA: NEGATIVE
Glucose, UA: NEGATIVE mg/dL
Ketones, POC UA: NEGATIVE mg/dL
Leukocytes, UA: NEGATIVE
Nitrite, UA: NEGATIVE
Protein Ur, POC: 100 mg/dL — AB
Spec Grav, UA: 1.02 (ref 1.010–1.025)
Urobilinogen, UA: 0.2 E.U./dL
pH, UA: 5.5 (ref 5.0–8.0)

## 2019-09-17 LAB — COMPREHENSIVE METABOLIC PANEL
ALT: 27 U/L (ref 0–44)
AST: 20 U/L (ref 15–41)
Albumin: 4.4 g/dL (ref 3.5–5.0)
Alkaline Phosphatase: 96 U/L (ref 38–126)
Anion gap: 9 (ref 5–15)
BUN: 17 mg/dL (ref 8–23)
CO2: 24 mmol/L (ref 22–32)
Calcium: 9.1 mg/dL (ref 8.9–10.3)
Chloride: 105 mmol/L (ref 98–111)
Creatinine, Ser: 1.35 mg/dL — ABNORMAL HIGH (ref 0.61–1.24)
GFR calc Af Amer: >60 mL/min (ref >60)
GFR calc non Af Amer: 55 mL/min — ABNORMAL LOW (ref >60)
Glucose, Bld: 122 mg/dL — ABNORMAL HIGH (ref 70–99)
Potassium: 4.3 mmol/L (ref 3.5–5.1)
Sodium: 138 mmol/L (ref 135–145)
Total Bilirubin: 1.2 mg/dL (ref 0.3–1.2)
Total Protein: 7 g/dL (ref 6.5–8.1)

## 2019-09-17 MED ORDER — ACETAMINOPHEN ER 650 MG PO TBCR
650.0000 mg | EXTENDED_RELEASE_TABLET | Freq: Three times a day (TID) | ORAL | 0 refills | Status: DC | PRN
Start: 2019-09-17 — End: 2021-02-08

## 2019-09-17 MED ORDER — HYDROCODONE-ACETAMINOPHEN 5-325 MG PO TABS: 1.0000 | ORAL_TABLET | Freq: Four times a day (QID) | ORAL | 0 refills | Status: DC | PRN

## 2019-09-17 NOTE — Discharge Instructions (Signed)
As discussed, your CT scan showed a small kidney stone. No signs of prostatitis or infection in your perineum region. I am sending you home with two pain medications. Take Tylenol as needed for mild to moderate pain and save hydrocodone for severe pain. Please call your urologist on Monday to schedule an appointment for further evaluation. Return to the ER for new or worsening symptoms.

## 2019-09-17 NOTE — ED Triage Notes (Signed)
Pt presents with dysuria and lower abdominal pain. Has h/o kidney stones

## 2019-09-17 NOTE — ED Triage Notes (Signed)
Pt reports pain in perineum and penis.  Reports went to urgent care and had x ray that showed kidney stone.  Was sent here for ct scan

## 2019-09-17 NOTE — ED Provider Notes (Signed)
[facility] EMERGENCY DEPARTMENT Provider Note   CSN: [id number] Arrival date & time: 09/17/19  6073     History Chief Complaint  Patient presents with  . Flank Pain    Adrian Marshall is a 64 y.o. male with a past medical history significant for BPH, hyperlipidemia, hypothyroidism, and CAD who presents to the ED from UC for further evaluation of a kidney stone. Patient had an abdominal x-ray performed a UC just prior to arrival which demonstrated a right nephrolithiasis and possible ureteral stone or soft tissue calcification. He was sent to the ED to obtain a CT scan for further evaluation. Patient admits to perineum and penis pain for the past 3 days that is worse with urination. Pain associated with decreased urinary output and dysuria. Denies hematuria. Pain is only present with urination. Denies abdominal and flank pain. Denies penile discharge and scrotal swelling. Denies concerns for STDs at this time. Denies fever and chills. Patient has a history of roughly 9-10 kidney stones and has had to have one previously removed. He has a nephrologist with the [facility] and sees Dr. Bernardo Heater with urology in [place]. Last BM was yesterday which was normal.   History obtained from patient and past medical records. No interpreter used during encounter.      Past Medical History:  Diagnosis Date  . Arthritis    NECK  . BPH (benign prostatic hyperplasia)   . Cancer (HCC)    SKIN CANCER-BASAL  . Coronary artery disease   . History of kidney stones   . Hypercholesterolemia   . Hypothyroidism   . Renal disorder    kidney stones  . Sleep apnea    USES CPAP  . Thyroid disease     Patient Active Problem List   Diagnosis Date Noted  . Sleep apnea 04/02/2018  . Ureteral calculus 08/15/2017  . S/P TURP 07/20/2017  . Nephrolithiasis 06/17/2017    Past Surgical History:  Procedure Laterality Date  . ANKLE SURGERY    . CYSTOSCOPY WITH RETROGRADE PYELOGRAM, URETEROSCOPY AND STENT  PLACEMENT Left 08/16/2017   Procedure: CYSTOSCOPY WITH RETROGRADE PYELOGRAM, URETEROSCOPY AND STENT PLACEMENT;  Surgeon: Hollice Espy, MD;  Location: [facility] OR[facility];  Service: Urology;  Laterality: Left;  . HERNIA REPAIR    . KNEE SURGERY    . NECK SURGERY    . SHOULDER SURGERY    . TRANSURETHRAL RESECTION OF PROSTATE N/A 07/20/2017   Procedure: TRANSURETHRAL RESECTION OF THE PROSTATE (TURP);  Surgeon: Abbie Sons, MD;  Location: [facility] OR[facility];  Service: Urology;  Laterality: N/A;  . URETEROSCOPY WITH HOLMIUM LASER LITHOTRIPSY Left 08/16/2017   Procedure: URETEROSCOPY WITH HOLMIUM LASER LITHOTRIPSY;  Surgeon: Hollice Espy, MD;  Location: [facility] OR[facility];  Service: Urology;  Laterality: Left;  Marland Kitchen VASECTOMY         Family History  Problem Relation Age of Onset  . Prostate cancer Neg Hx   . Bladder Cancer Neg Hx   . Kidney cancer Neg Hx     Social History   Tobacco Use  . Smoking status: Never Smoker  . Smokeless tobacco: Never Used  Substance Use Topics  . Alcohol use: Yes    Comment: RARE  . Drug use: No    Home Medications Prior to Admission medications   Medication Sig Start Date End Date Taking? Authorizing Provider  acetaminophen (TYLENOL 8 HOUR) 650 MG CR tablet Take 1 tablet (650 mg total) by mouth every 8 (eight) hours as needed for pain. 09/17/19   Charmaine Downs  C, PA-C  acetaminophen (TYLENOL) 325 MG tablet Take 325 mg by mouth every 6 (six) hours as needed for moderate pain or headache.     [provider]  ALL DAY ALLERGY 10 MG tablet  07/21/18   [provider]  aspirin EC 81 MG tablet Take 81 mg by mouth daily.    [provider]  atorvastatin (LIPITOR) 80 MG tablet Take 80 mg by mouth every evening.     [provider]  Cholecalciferol (VITAMIN D) 50 MCG (2000 UT) tablet  11/24/18   [provider]  cyclobenzaprine (FLEXERIL) 10 MG tablet Take 1 tablet (10 mg total) by mouth 2 (two) times daily as needed for muscle spasms. 08/09/19    Elnora Morrison, MD  dorzolamide (TRUSOPT) 2 % ophthalmic solution Place 1 drop into both eyes 2 (two) times daily. 07/19/17   [provider]  doxycycline (VIBRAMYCIN) 100 MG capsule Take 1 capsule (100 mg total) by mouth 2 (two) times daily. 05/22/19   Wurst, [place], PA-C  HYDROcodone-acetaminophen (NORCO) 5-325 MG tablet Take 1-2 tablets by mouth every 6 (six) hours as needed for severe pain. 08/09/19   Elnora Morrison, MD  HYDROcodone-acetaminophen (NORCO/VICODIN) 5-325 MG tablet Take 1 tablet by mouth every 6 (six) hours as needed for severe pain. 09/17/19   Suzy Bouchard, PA-C  levothyroxine (SYNTHROID, LEVOTHROID) 200 MCG tablet Take 200 mcg by mouth daily before breakfast.    [provider]  tamsulosin (FLOMAX) 0.4 MG CAPS capsule Take 0.4 mg by mouth 2 (two) times daily.     [provider]    Allergies    Cortisone, Neurontin [gabapentin], and Other  Review of Systems   Review of Systems  Constitutional: Negative for chills and fever.  Gastrointestinal: Negative for abdominal pain, diarrhea, nausea and vomiting.  Genitourinary: Positive for decreased urine volume, difficulty urinating, dysuria and penile pain. Negative for discharge, flank pain, hematuria, penile swelling, scrotal swelling and testicular pain.  Musculoskeletal: Negative for back pain.  All other systems reviewed and are negative.   Physical Exam Updated Vital Signs BP (!) 135/91 (BP Location: Left Arm)   Pulse (!) 102   Temp (!) 97.5 F (36.4 C) (Oral)   Resp 18   Ht 5\' 10"  (1.778 m)   Wt 91.2 kg   SpO2 96%   BMI 28.84 kg/m   Physical Exam Vitals and nursing note reviewed. Exam conducted with a chaperone present.  Constitutional:      General: He is not in acute distress.    Appearance: He is not ill-appearing.  HENT:     Head: Normocephalic.  Eyes:     Pupils: Pupils are equal, round, and reactive to light.  Cardiovascular:     Rate and Rhythm: Normal rate and regular  rhythm.     Pulses: Normal pulses.     Heart sounds: Normal heart sounds. No murmur. No friction rub. No gallop.   Pulmonary:     Effort: Pulmonary effort is normal.     Breath sounds: Normal breath sounds.  Abdominal:     General: Abdomen is flat. Bowel sounds are normal. There is no distension.     Palpations: Abdomen is soft.     Tenderness: There is no abdominal tenderness. There is no right CVA tenderness, left CVA tenderness, guarding or rebound.     Comments: Abdomen soft, nondistended, nontender to palpation in all quadrants without guarding or peritoneal signs. No rebound.   Genitourinary:    Penis: Normal  and circumcised. No tenderness or discharge.      Rectum: Normal. No tenderness.     Comments: Normal circumcised penis with no lesions, discharge, or tenderness. No swelling. No scrotal swelling or tenderness. Normal rectal exam with no tenderness over prostate. No fluctuance or induration in perineum region. No overlying erythema.  Musculoskeletal:     Cervical back: Neck supple.     Comments: Able to move all 4 extremities without difficulty.   Skin:    General: Skin is warm and dry.  Neurological:     General: No focal deficit present.     Mental Status: He is alert.  Psychiatric:        Mood and Affect: Mood normal.        Behavior: Behavior normal.     ED Results / Procedures / Treatments   Labs (all labs ordered are listed, but only abnormal results are displayed) Labs Reviewed  COMPREHENSIVE METABOLIC PANEL - Abnormal; Notable for the following components:      Result Value   Glucose, Bld 122 (*)    Creatinine, Ser 1.35 (*)    GFR calc non Af Amer 55 (*)    All other components within normal limits  CBC WITH DIFFERENTIAL/PLATELET    EKG None  Radiology DG Abdomen 1 View  Result Date: 09/17/2019 CLINICAL DATA:  Possible kidney stones. LOWER abdominal pain and perineum/penile pain, burning, and pain with urination for 3 days. History of kidney stones  and prostate surgery. EXAM: ABDOMEN - 1 VIEW COMPARISON:  11/20/2018 FINDINGS: Punctate calcification overlies the RIGHT renal shadow, measuring approximately 3 millimeters. No calcifications identified in the expected locations of the ureters. A 7 millimeter calcification is seen at the midline just inferior to the symphysis pubis, possibly representing urethral stone or soft tissue calcification. Visualized bowel gas pattern is nonobstructive. Moderate stool burden. Visualized osseous structures have a normal appearance. IMPRESSION: 1. RIGHT nephrolithiasis. 2. Possible urethral stone or soft tissue calcification. Consider CT of the abdomen and pelvis stone protocol for further characterization. 3. Nonobstructive bowel gas pattern. Moderate stool burden. These results will be called to the ordering clinician or representative by the Radiologist Assistant, and communication documented in the PACS or [employer]. Electronically Signed   By: Nolon Nations M.D.   On: 09/17/2019 09:19   CT Renal Stone Study  Result Date: 09/17/2019 CLINICAL DATA:  Pt reports pain in perineum and penis. Reports went to urgent care and had x ray that showed kidney stone. Was sent here for ct scan EXAM: CT ABDOMEN AND PELVIS WITHOUT CONTRAST TECHNIQUE: Multidetector CT imaging of the abdomen and pelvis was performed following the standard protocol without IV contrast. COMPARISON:  Current abdomen x-ray from 09/17/2019. CT abdomen and pelvis dated 03/31/2018. FINDINGS: Lower chest: Subtle areas of ground-glass opacity are noted at the lung bases which may be due to atelectasis, but infection or inflammation should be considered in the proper clinical setting. These areas were not present the prior CT. Heart normal in size. Hepatobiliary: No focal liver abnormality is seen. No gallstones, gallbladder wall thickening, or biliary dilatation. Pancreas: Unremarkable. No pancreatic ductal dilatation or surrounding inflammatory  changes. Spleen: Normal in size without focal abnormality. Adrenals/Urinary Tract: No adrenal masses. Kidneys normal in size, orientation and position. Small nonobstructing stone in the mid to lower pole the right kidney corresponds to the stone seen on the current radiographs, which is new compared to the prior CT. No other intrarenal stones, no masses and no hydronephrosis. Ureters  are normal in course and in caliber. No ureteral stones. Normal bladder. Stomach/Bowel: Normal stomach. Small bowel and colon are normal in caliber. No wall thickening. No inflammation. There are numerous colonic diverticula, mostly on the left. No diverticulitis. Normal appendix visualized. Vascular/Lymphatic: Aortic atherosclerosis. No enlarged abdominal or pelvic lymph nodes. Reproductive: Prostate normal in size. Other: No abdominal wall hernia or abnormality. No abdominopelvic ascites. Musculoskeletal: No fracture or acute finding. No osteoblastic or osteolytic lesions. IMPRESSION: 1. No acute findings.  No ureteral stone or obstructive uropathy. 2. Small nonobstructing stone in the mid to to lower pole the right kidney corresponds to the stone seen known on the current radiographs. This is new since the prior CT. 3. Significant colonic diverticulosis, but no evidence of diverticulitis or other bowel inflammatory process. 4. Aortic atherosclerosis. Electronically Signed   By: Lajean Manes M.D.   On: 09/17/2019 10:53    Procedures Procedures (including critical care time)  Medications Ordered in ED Medications - No data to display  ED Course  I have reviewed the triage vital signs and the nursing notes.  Pertinent labs & imaging results that were available during my care of the patient were reviewed by me and considered in my medical decision making (see chart for details).    MDM Rules/Calculators/A&P                     64 year old male presents to the ED from urgent care for further evaluation of a kidney and  possible urethral stone.  Patient has a history of past kidney stones and has had 1 stone removal.  Upon arrival, patient afebrile, not tachycardic or hypoxic.  Patient in no acute distress and nontoxic-appearing.  Physical exam reassuring.  Abdomen soft, nondistended, nontender.  Negative CVA tenderness bilaterally.  Normal circumcised penis with no lesions or discharge.  No scrotal swelling or tenderness.  Will obtain routine labs and CT renal study for further evaluation of size of stones given patient's history of past stone removal. Patient deferred pain medication at this time.   UA performed at urgent care prior to arrival which is negative for signs of infection.  Significant for proteinuria and hematuria.  Urine culture pending.  Low suspicion for infected nephrolithiasis given noninfectious urine.  CBC unremarkable no leukocytosis and normal hemoglobin. Renal study personally reviewed which demonstrates: IMPRESSION:  1. No acute findings. No ureteral stone or obstructive uropathy.  2. Small nonobstructing stone in the mid to to lower pole the right  kidney corresponds to the stone seen known on the current  radiographs. This is new since the prior CT.  3. Significant colonic diverticulosis, but no evidence of  diverticulitis or other bowel inflammatory process.  4. Aortic atherosclerosis.   CMP reassuring with no major electrolyte derangements. Mild elevation in creatinine at 1.35, but normal BUN. Rectal exam performed with no tenderness over prostate. Doubt prostatitis. No induration or fluctuance to suggest abscess formation. Will discharge patient with pain medication. Advised patient to call his urologist on Monday to schedule an appointment for further evaluation. Strict ED precautions discussed with patient. Patient states understanding and agrees to plan. Patient discharged home in no acute distress and stable vitals  Discussed case with Dr. Laverta Baltimore who agrees with assessment and  plan Final Clinical Impression(s) / ED Diagnoses Final diagnoses:  Nephrolithiasis  Perineum pain, male    Rx / DC Orders ED Discharge Orders         Ordered    HYDROcodone-acetaminophen (NORCO/VICODIN)  5-325 MG tablet  Every 6 hours PRN     09/17/19 1123    acetaminophen (TYLENOL 8 HOUR) 650 MG CR tablet  Every 8 hours PRN     09/17/19 1123           Karie Kirks 09/17/19 1156    Margette Fast, MD 09/18/19 [phone]

## 2019-09-17 NOTE — Discharge Instructions (Signed)
Patient was advised to go to ED for further evaluation with a CT scan of the abdomen

## 2019-09-17 NOTE — ED Provider Notes (Addendum)
[facility]    CSN: [id number] Arrival date & time: 09/17/19  0810      History   Chief Complaint Chief Complaint  Patient presents with  . Dysuria    HPI Adrian Marshall is a 65 y.o. male.   With history of kidney stone presented to the urgent care with a complaint of dysuria and perineum pain for the past few days.  Localized the pain between the rectum and testicles.  He denies a precipitating event or recent sexual encounter.   His symptom symptoms are made worse with urination.  He reports similar symptoms in the past that improved with stone removal.  He complains of decreased urine amount.  Currently he denies fever, chills, nausea, vomiting, abdominal pain, flank pain, hematuria, or incontinence.   The history is provided by the patient. No language interpreter was used.  Dysuria Presenting symptoms: dysuria and penile pain     Past Medical History:  Diagnosis Date  . Arthritis    NECK  . BPH (benign prostatic hyperplasia)   . Cancer (HCC)    SKIN CANCER-BASAL  . Coronary artery disease   . History of kidney stones   . Hypercholesterolemia   . Hypothyroidism   . Renal disorder    kidney stones  . Sleep apnea    USES CPAP  . Thyroid disease     Patient Active Problem List   Diagnosis Date Noted  . Sleep apnea 04/02/2018  . Ureteral calculus 08/15/2017  . S/P TURP 07/20/2017  . Nephrolithiasis 06/17/2017    Past Surgical History:  Procedure Laterality Date  . ANKLE SURGERY    . CYSTOSCOPY WITH RETROGRADE PYELOGRAM, URETEROSCOPY AND STENT PLACEMENT Left 08/16/2017   Procedure: CYSTOSCOPY WITH RETROGRADE PYELOGRAM, URETEROSCOPY AND STENT PLACEMENT;  Surgeon: Hollice Espy, MD;  Location: [facility];  Service: Urology;  Laterality: Left;  . HERNIA REPAIR    . KNEE SURGERY    . NECK SURGERY    . SHOULDER SURGERY    . TRANSURETHRAL RESECTION OF PROSTATE N/A 07/20/2017   Procedure: TRANSURETHRAL RESECTION OF THE PROSTATE (TURP);  Surgeon:  Abbie Sons, MD;  Location: [facility];  Service: Urology;  Laterality: N/A;  . URETEROSCOPY WITH HOLMIUM LASER LITHOTRIPSY Left 08/16/2017   Procedure: URETEROSCOPY WITH HOLMIUM LASER LITHOTRIPSY;  Surgeon: Hollice Espy, MD;  Location: [facility];  Service: Urology;  Laterality: Left;  Marland Kitchen VASECTOMY         Home Medications    Prior to Admission medications   Medication Sig Start Date End Date Taking? Authorizing Provider  acetaminophen (TYLENOL) 325 MG tablet Take 325 mg by mouth every 6 (six) hours as needed for moderate pain or headache.     [provider]  ALL DAY ALLERGY 10 MG tablet  07/21/18   [provider]  aspirin EC 81 MG tablet Take 81 mg by mouth daily.    [provider]  atorvastatin (LIPITOR) 80 MG tablet Take 80 mg by mouth every evening.     [provider]  Cholecalciferol (VITAMIN D) 50 MCG (2000 UT) tablet  11/24/18   [provider]  cyclobenzaprine (FLEXERIL) 10 MG tablet Take 1 tablet (10 mg total) by mouth 2 (two) times daily as needed for muscle spasms. 08/09/19   Elnora Morrison, MD  dorzolamide (TRUSOPT) 2 % ophthalmic solution Place 1 drop into both eyes 2 (two) times daily. 07/19/17   [provider]  doxycycline (VIBRAMYCIN) 100 MG capsule Take 1 capsule (100 mg  total) by mouth 2 (two) times daily. 05/22/19   Wurst, [place], PA-C  HYDROcodone-acetaminophen (NORCO) 5-325 MG tablet Take 1-2 tablets by mouth every 6 (six) hours as needed for severe pain. 08/09/19   Elnora Morrison, MD  levothyroxine (SYNTHROID, LEVOTHROID) 200 MCG tablet Take 200 mcg by mouth daily before breakfast.    [provider]  tamsulosin (FLOMAX) 0.4 MG CAPS capsule Take 0.4 mg by mouth 2 (two) times daily.     [provider]    Family History Family History  Problem Relation Age of Onset  . Prostate cancer Neg Hx   . Bladder Cancer Neg Hx   . Kidney cancer Neg Hx     Social History Social History   Tobacco Use    . Smoking status: Never Smoker  . Smokeless tobacco: Never Used  Substance Use Topics  . Alcohol use: Yes    Comment: RARE  . Drug use: No     Allergies   Cortisone, Neurontin [gabapentin], and Other   Review of Systems Review of Systems  Constitutional: Negative.   Respiratory: Negative.   Cardiovascular: Negative.   Gastrointestinal: Negative.   Genitourinary: Positive for decreased urine volume, dysuria and penile pain.  All other systems reviewed and are negative.    Physical Exam Triage Vital Signs ED Triage Vitals  Enc Vitals Group     BP 09/17/19 0819 133/87     Pulse Rate 09/17/19 0819 (!) 102     Resp 09/17/19 0819 16     Temp 09/17/19 0819 97.9 F (36.6 C)     Temp Source 09/17/19 0819 Oral     SpO2 09/17/19 0819 96 %     Weight --      Height --      Head Circumference --      Peak Flow --      Pain Score 09/17/19 0824 8     Pain Loc --      Pain Edu? --      Excl. in [place]? --    No data found.  Updated Vital Signs BP 133/87 (BP Location: Right Arm)   Pulse (!) 102   Temp 97.9 F (36.6 C) (Oral)   Resp 16   SpO2 96%   Visual Acuity Right Eye Distance:   Left Eye Distance:   Bilateral Distance:    Right Eye Near:   Left Eye Near:    Bilateral Near:     Physical Exam Vitals and nursing note reviewed.  Constitutional:      General: He is not in acute distress.    Appearance: Normal appearance. He is normal weight. He is not ill-appearing, toxic-appearing or diaphoretic.  Cardiovascular:     Rate and Rhythm: Normal rate and regular rhythm.     Pulses: Normal pulses.     Heart sounds: Normal heart sounds. No murmur. No friction rub. No gallop.   Pulmonary:     Effort: Pulmonary effort is normal. No respiratory distress.     Breath sounds: Normal breath sounds. No stridor. No wheezing, rhonchi or rales.  Chest:     Chest wall: No tenderness.  Abdominal:     General: Abdomen is flat. Bowel sounds are normal. There is no distension.      Palpations: Abdomen is soft. There is no mass.     Tenderness: There is no abdominal tenderness. There is no right CVA tenderness, left CVA tenderness, guarding or rebound.     Hernia: No hernia is present.  Neurological:     Mental Status: He is alert and oriented to person, place, and time.  Psychiatric:        Mood and Affect: Mood normal.        Behavior: Behavior normal.      UC Treatments / Results  Labs (all labs ordered are listed, but only abnormal results are displayed) Labs Reviewed  POCT URINALYSIS DIP (MANUAL ENTRY) - Abnormal; Notable for the following components:      Result Value   Blood, UA trace-intact (*)    Protein Ur, POC =100 (*)    All other components within normal limits  URINE CULTURE    EKG   Radiology DG Abdomen 1 View  Result Date: 09/17/2019 CLINICAL DATA:  Possible kidney stones. LOWER abdominal pain and perineum/penile pain, burning, and pain with urination for 3 days. History of kidney stones and prostate surgery. EXAM: ABDOMEN - 1 VIEW COMPARISON:  11/20/2018 FINDINGS: Punctate calcification overlies the RIGHT renal shadow, measuring approximately 3 millimeters. No calcifications identified in the expected locations of the ureters. A 7 millimeter calcification is seen at the midline just inferior to the symphysis pubis, possibly representing urethral stone or soft tissue calcification. Visualized bowel gas pattern is nonobstructive. Moderate stool burden. Visualized osseous structures have a normal appearance. IMPRESSION: 1. RIGHT nephrolithiasis. 2. Possible urethral stone or soft tissue calcification. Consider CT of the abdomen and pelvis stone protocol for further characterization. 3. Nonobstructive bowel gas pattern. Moderate stool burden. These results will be called to the ordering clinician or representative by the Radiologist Assistant, and communication documented in the PACS or [employer]. Electronically Signed   By: Nolon Nations  M.D.   On: 09/17/2019 09:19    Procedures Procedures (including critical care time)  Medications Ordered in UC Medications - No data to display  Initial Impression / Assessment and Plan / UC Course  I have reviewed the triage vital signs and the nursing notes.  Pertinent labs & imaging results that were available during my care of the patient were reviewed by me and considered in my medical decision making (see chart for details).   Patient is stable at discharge  Abdomen x-ray is positive for right nephrolithiasis and possible ureteral stone or soft tissue calcification.  I have reviewed the x-ray myself and the radiologist interpretation.  I am in agreement with the radiologist interpretation.  Further evaluation in the ED is recommended with possible abdomen CT.  POCT urine analysis was inconclusive, urine culture was sent.  Final Clinical Impressions(s) / UC Diagnoses   Final diagnoses:  Nephrolithiasis  Penile pain     Discharge Instructions     Patient was advised to go to ED for further evaluation with a CT scan of the abdomen     ED Prescriptions    None     PDMP not reviewed this encounter.   Emerson Monte, FNP 09/17/19 0957    Emerson Monte, FNP 09/17/19 1011

## 2019-12-02 ENCOUNTER — Ambulatory Visit: Admitting: Urology

## 2020-03-29 ENCOUNTER — Emergency Department (HOSPITAL_COMMUNITY)
Admission: EM | Admit: 2020-03-29 | Discharge: 2020-03-29 | Disposition: A | Discharge status: Home / Self Care | Attending: Emergency Medicine | Admitting: Emergency Medicine

## 2020-03-29 ENCOUNTER — Emergency Department (HOSPITAL_COMMUNITY)

## 2020-03-29 ENCOUNTER — Other Ambulatory Visit: Payer: Self-pay

## 2020-03-29 ENCOUNTER — Encounter (HOSPITAL_COMMUNITY): Payer: Self-pay | Admitting: Emergency Medicine

## 2020-03-29 DIAGNOSIS — R109 Unspecified abdominal pain: Secondary | ICD-10-CM | POA: Diagnosis present

## 2020-03-29 DIAGNOSIS — E039 Hypothyroidism, unspecified: Secondary | ICD-10-CM | POA: Insufficient documentation

## 2020-03-29 DIAGNOSIS — I2510 Atherosclerotic heart disease of native coronary artery without angina pectoris: Secondary | ICD-10-CM | POA: Diagnosis not present

## 2020-03-29 DIAGNOSIS — Z7982 Long term (current) use of aspirin: Secondary | ICD-10-CM | POA: Insufficient documentation

## 2020-03-29 DIAGNOSIS — Z79899 Other long term (current) drug therapy: Secondary | ICD-10-CM | POA: Insufficient documentation

## 2020-03-29 DIAGNOSIS — N200 Calculus of kidney: Secondary | ICD-10-CM | POA: Insufficient documentation

## 2020-03-29 LAB — CBC WITH DIFFERENTIAL/PLATELET
Abs Immature Granulocytes: 0.07 10*3/uL (ref 0.00–0.07)
Basophils Absolute: 0.1 10*3/uL (ref 0.0–0.1)
Basophils Relative: 1 %
Eosinophils Absolute: 0 10*3/uL (ref 0.0–0.5)
Eosinophils Relative: 0 %
HCT: 50.3 % (ref 39.0–52.0)
Hemoglobin: 16.5 g/dL (ref 13.0–17.0)
Immature Granulocytes: 1 %
Lymphocytes Relative: 5 %
Lymphs Abs: 0.7 10*3/uL (ref 0.7–4.0)
MCH: 30.3 pg (ref 26.0–34.0)
MCHC: 32.8 g/dL (ref 30.0–36.0)
MCV: 92.3 fL (ref 80.0–100.0)
Monocytes Absolute: 0.6 10*3/uL (ref 0.1–1.0)
Monocytes Relative: 5 %
Neutro Abs: 11.3 10*3/uL — ABNORMAL HIGH (ref 1.7–7.7)
Neutrophils Relative %: 88 %
Platelets: 249 10*3/uL (ref 150–400)
RBC: 5.45 MIL/uL (ref 4.22–5.81)
RDW: 12.6 % (ref 11.5–15.5)
WBC: 12.7 10*3/uL — ABNORMAL HIGH (ref 4.0–10.5)
nRBC: 0 % (ref 0.0–0.2)

## 2020-03-29 LAB — BASIC METABOLIC PANEL
Anion gap: 10 (ref 5–15)
BUN: 19 mg/dL (ref 8–23)
CO2: 24 mmol/L (ref 22–32)
Calcium: 9 mg/dL (ref 8.9–10.3)
Chloride: 105 mmol/L (ref 98–111)
Creatinine, Ser: 1.47 mg/dL — ABNORMAL HIGH (ref 0.61–1.24)
GFR, Estimated: 53 mL/min — ABNORMAL LOW (ref >60)
Glucose, Bld: 119 mg/dL — ABNORMAL HIGH (ref 70–99)
Potassium: 4.2 mmol/L (ref 3.5–5.1)
Sodium: 139 mmol/L (ref 135–145)

## 2020-03-29 LAB — URINALYSIS, ROUTINE W REFLEX MICROSCOPIC
Bacteria, UA: NONE SEEN
Bilirubin Urine: NEGATIVE
Glucose, UA: NEGATIVE mg/dL
Ketones, ur: NEGATIVE mg/dL
Leukocytes,Ua: NEGATIVE
Nitrite: NEGATIVE
Protein, ur: 30 mg/dL — AB
RBC / HPF: >50 RBC/hpf — ABNORMAL HIGH (ref 0–5)
Specific Gravity, Urine: 1.023 (ref 1.005–1.030)
pH: 5 (ref 5.0–8.0)

## 2020-03-29 MED ORDER — ONDANSETRON 4 MG PO TBDP: 4.0000 mg | ORAL_TABLET | Freq: Three times a day (TID) | ORAL | 1 refills | Status: DC | PRN

## 2020-03-29 MED ORDER — HYDROCODONE-ACETAMINOPHEN 5-325 MG PO TABS: 1.0000 | ORAL_TABLET | Freq: Four times a day (QID) | ORAL | 0 refills | Status: DC | PRN

## 2020-03-29 MED ORDER — ONDANSETRON HCL 4 MG/2ML IJ SOLN
4.0000 mg | Freq: Once | INTRAMUSCULAR | Status: AC
  Administered 2020-03-29: 4 mg via INTRAVENOUS
  Filled 2020-03-29: qty 2

## 2020-03-29 MED ORDER — HYDROMORPHONE HCL 1 MG/ML IJ SOLN
1.0000 mg | Freq: Once | INTRAMUSCULAR | Status: AC
  Administered 2020-03-29: 1 mg via INTRAVENOUS
  Filled 2020-03-29: qty 1

## 2020-03-29 MED ORDER — SODIUM CHLORIDE 0.9 % IV SOLN: INTRAVENOUS | Status: DC

## 2020-03-29 MED ORDER — SODIUM CHLORIDE 0.9 % IV BOLUS
1000.0000 mL | Freq: Once | INTRAVENOUS | Status: AC
  Administered 2020-03-29: 1000 mL via INTRAVENOUS

## 2020-03-29 NOTE — ED Provider Notes (Addendum)
[facility] Provider Note   CSN: [id number] Arrival date & time: 03/29/20  5885     History Chief Complaint  Patient presents with   Flank Pain    Adrian Marshall is a 64 y.o. male.  Patient with acute onset of right flank pain.  Started CVA area.  Went down into the groin area.  This started this morning.  Patient's had a past history of kidney stones.  Followed by urology in [place].  Also followed by nephrology in the [place] system.  Patient states the pain is severe.  Also having difficulty voiding.        Past Medical History:  Diagnosis Date   Arthritis    NECK   BPH (benign prostatic hyperplasia)    Cancer (HCC)    SKIN CANCER-BASAL   Coronary artery disease    History of kidney stones    Hypercholesterolemia    Hypothyroidism    Renal disorder    kidney stones   Sleep apnea    USES CPAP   Thyroid disease     Patient Active Problem List   Diagnosis Date Noted   Sleep apnea 04/02/2018   Ureteral calculus 08/15/2017   S/P TURP 07/20/2017   Nephrolithiasis 06/17/2017    Past Surgical History:  Procedure Laterality Date   ANKLE SURGERY     CYSTOSCOPY WITH RETROGRADE PYELOGRAM, URETEROSCOPY AND STENT PLACEMENT Left 08/16/2017   Procedure: CYSTOSCOPY WITH RETROGRADE PYELOGRAM, URETEROSCOPY AND STENT PLACEMENT;  Surgeon: Hollice Espy, MD;  Location: [facility];  Service: Urology;  Laterality: Left;   HERNIA REPAIR     KNEE SURGERY     NECK SURGERY     SHOULDER SURGERY     TRANSURETHRAL RESECTION OF PROSTATE N/A 07/20/2017   Procedure: TRANSURETHRAL RESECTION OF THE PROSTATE (TURP);  Surgeon: Abbie Sons, MD;  Location: [facility];  Service: Urology;  Laterality: N/A;   URETEROSCOPY WITH HOLMIUM LASER LITHOTRIPSY Left 08/16/2017   Procedure: URETEROSCOPY WITH HOLMIUM LASER LITHOTRIPSY;  Surgeon: Hollice Espy, MD;  Location: [facility];  Service: Urology;  Laterality: Left;   VASECTOMY         Family  History  Problem Relation Age of Onset   Prostate cancer Neg Hx    Bladder Cancer Neg Hx    Kidney cancer Neg Hx     Social History   Tobacco Use   Smoking status: Never Smoker   Smokeless tobacco: Never Used  Vaping Use   Vaping Use: Never used  Substance Use Topics   Alcohol use: Yes    Comment: RARE   Drug use: No    Home Medications Prior to Admission medications   Medication Sig Start Date End Date Taking? Authorizing Provider  acetaminophen (TYLENOL 8 HOUR) 650 MG CR tablet Take 1 tablet (650 mg total) by mouth every 8 (eight) hours as needed for pain. 09/17/19   Suzy Bouchard, PA-C  acetaminophen (TYLENOL) 325 MG tablet Take 325 mg by mouth every 6 (six) hours as needed for moderate pain or headache.     [provider]  ALL DAY ALLERGY 10 MG tablet  07/21/18   [provider]  aspirin EC 81 MG tablet Take 81 mg by mouth daily.    [provider]  atorvastatin (LIPITOR) 80 MG tablet Take 80 mg by mouth every evening.     [provider]  Cholecalciferol (VITAMIN D) 50 MCG (2000 UT) tablet  11/24/18   [provider]  cyclobenzaprine (FLEXERIL)  10 MG tablet Take 1 tablet (10 mg total) by mouth 2 (two) times daily as needed for muscle spasms. 08/09/19   Elnora Morrison, MD  dorzolamide (TRUSOPT) 2 % ophthalmic solution Place 1 drop into both eyes 2 (two) times daily. 07/19/17   [provider]  doxycycline (VIBRAMYCIN) 100 MG capsule Take 1 capsule (100 mg total) by mouth 2 (two) times daily. 05/22/19   Wurst, [place], PA-C  HYDROcodone-acetaminophen (NORCO) 5-325 MG tablet Take 1-2 tablets by mouth every 6 (six) hours as needed for severe pain. 08/09/19   Elnora Morrison, MD  HYDROcodone-acetaminophen (NORCO/VICODIN) 5-325 MG tablet Take 1 tablet by mouth every 6 (six) hours as needed for severe pain. 09/17/19   Suzy Bouchard, PA-C  levothyroxine (SYNTHROID, LEVOTHROID) 200 MCG tablet Take 200 mcg by mouth daily  before breakfast.    [provider]  tamsulosin (FLOMAX) 0.4 MG CAPS capsule Take 0.4 mg by mouth 2 (two) times daily.     [provider]    Allergies    Cortisone, Neurontin [gabapentin], and Other  Review of Systems   Review of Systems  Constitutional: Negative for chills and fever.  HENT: Negative for rhinorrhea and sore throat.   Eyes: Negative for visual disturbance.  Respiratory: Negative for cough and shortness of breath.   Cardiovascular: Negative for chest pain and leg swelling.  Gastrointestinal: Positive for abdominal pain and nausea. Negative for diarrhea and vomiting.  Genitourinary: Positive for difficulty urinating and flank pain. Negative for dysuria.  Musculoskeletal: Negative for back pain and neck pain.  Skin: Negative for rash.  Neurological: Negative for dizziness, light-headedness and headaches.  Hematological: Does not bruise/bleed easily.  Psychiatric/Behavioral: Negative for confusion.    Physical Exam Updated Vital Signs BP (!) 158/90    Pulse 92    Temp 97.6 F (36.4 C) (Oral)    Resp (!) 22    Ht 1.778 m (5\' 10" )    Wt 93.4 kg    SpO2 95%    BMI 29.56 kg/m   Physical Exam Vitals and nursing note reviewed.  Constitutional:      Appearance: Normal appearance. He is well-developed and well-nourished.  HENT:     Head: Normocephalic and atraumatic.  Eyes:     Extraocular Movements: Extraocular movements intact.     Conjunctiva/sclera: Conjunctivae normal.     Pupils: Pupils are equal, round, and reactive to light.  Cardiovascular:     Rate and Rhythm: Normal rate and regular rhythm.     Heart sounds: No murmur heard.   Pulmonary:     Effort: Pulmonary effort is normal. No respiratory distress.     Breath sounds: Normal breath sounds.  Abdominal:     Palpations: Abdomen is soft.     Tenderness: There is no abdominal tenderness.  Musculoskeletal:        General: No edema. Normal range of motion.     Cervical back: Normal  range of motion and neck supple.  Skin:    General: Skin is warm and dry.     Capillary Refill: Capillary refill takes less than 2 seconds.  Neurological:     General: No focal deficit present.     Mental Status: He is alert and oriented to person, place, and time.     Cranial Nerves: No cranial nerve deficit.     Sensory: No sensory deficit.  Psychiatric:        Mood and Affect: Mood and affect normal.     ED Results /  Procedures / Treatments   Labs (all labs ordered are listed, but only abnormal results are displayed) Labs Reviewed  URINALYSIS, ROUTINE W REFLEX MICROSCOPIC - Abnormal; Notable for the following components:      Result Value   APPearance HAZY (*)    Hgb urine dipstick LARGE (*)    Protein, ur 30 (*)    RBC / HPF >50 (*)    All other components within normal limits  BASIC METABOLIC PANEL  CBC WITH DIFFERENTIAL/PLATELET    EKG None  Radiology CT Renal Stone Study  Result Date: 03/29/2020 CLINICAL DATA:  Right-sided flank pain EXAM: CT ABDOMEN AND PELVIS WITHOUT CONTRAST TECHNIQUE: Multidetector CT imaging of the abdomen and pelvis was performed following the standard protocol without IV contrast. COMPARISON:  None. FINDINGS: Lower chest: The visualized heart size within normal limits. No pericardial fluid/thickening. No hiatal hernia. The visualized portions of the lungs are clear. Hepatobiliary: Decreased density seen throughout the liver parenchyma. No evidence of calcified gallstones or biliary ductal dilatation. Pancreas:  Unremarkable.  No surrounding inflammatory changes. Spleen: Normal in size. Although limited due to the lack of intravenous contrast, normal in appearance. Adrenals/Urinary Tract: Both adrenal glands appear normal. Mild right pelvicaliectasis and ureterectasis is seen down to the level of the distal ureter where there is a 5 mm calculus present. There is right-sided perinephric and periureteral stranding. No left-sided renal or collecting  system calculi. The bladder is decompressed. Stomach/Bowel: The stomach, small bowel, and colon are normal in appearance. No inflammatory changes or obstructive findings. Scattered colonic diverticula are seen. Vascular/Lymphatic: There are no enlarged abdominal or pelvic lymph nodes. Scattered aortic atherosclerotic calcifications are seen without aneurysmal dilatation. Reproductive: The prostate is unremarkable. Other: No evidence of abdominal wall mass or hernia. Musculoskeletal: No acute or significant osseous findings. IMPRESSION: Mild right hydronephrosis with perinephric and periureteral stranding to the distal ureter where there is a 5 mm calculus present. Hepatic steatosis Diverticulosis without diverticulitis Aortic Atherosclerosis (ICD10-I70.0). Electronically Signed   By: Prudencio Pair M.D.   On: 03/29/2020 12:56    Procedures Procedures (including critical care time)  Medications Ordered in ED Medications  0.9 %  sodium chloride infusion ( Intravenous Canceled Entry 03/29/20 1407)  sodium chloride 0.9 % bolus 1,000 mL (1,000 mLs Intravenous New Bag/Given 03/29/20 1305)  HYDROmorphone (DILAUDID) injection 1 mg (1 mg Intravenous Given 03/29/20 1307)  ondansetron (ZOFRAN) injection 4 mg (4 mg Intravenous Given 03/29/20 1306)  HYDROmorphone (DILAUDID) injection 1 mg (1 mg Intravenous Given 03/29/20 1403)    ED Course  I have reviewed the triage vital signs and the nursing notes.  Pertinent labs & imaging results that were available during my care of the patient were reviewed by me and considered in my medical decision making (see chart for details).    MDM Rules/Calculators/A&P                           Work-up is consistent with 5 mm stone right ureter distally.  Awaiting basic labs.  Urinalysis shows greater than 50 red blood cells.  This all should be consistent with the kidney stone.  Patient with significant pain relief after second dose of hydromorphone.  If patient's renal  function is adequate we will give 1 dose of Toradol here.  Patient has urology in [place] to follow-up with.  Will be discharged with pain medicine and antinausea medicine.  Would expect him to pass this in the next 12 hours  it has progressed pretty rapidly.  Patient's labs without significant abnormalities.  Renal function slightly off with some renal insufficiency but patient states that this had that for baseline.  Patient now pain-free.  Patient has urology to follow-up with   Final Clinical Impression(s) / ED Diagnoses Final diagnoses:  Right flank pain  Kidney stone    Rx / DC Orders ED Discharge Orders    None       Fredia Sorrow, MD 03/29/20 1443    Fredia Sorrow, MD 03/29/20 [phone]

## 2020-03-29 NOTE — ED Notes (Signed)
This RN to bedside at patient request. On arrival, patient appears uncomfortable and restless in bed. Pt changing positions frequently and reports "I just can't pee." This RN notes that patient is tachypenic and hypertensive. All questions and concerns voiced addressed at this time. Cardiac monitor in place at this time. Will continue to monitor at this time.

## 2020-03-29 NOTE — ED Notes (Signed)
Pt to CT at this time by CT staff.

## 2020-03-29 NOTE — ED Notes (Signed)
Dr. Zackowski at bedside at this time. 

## 2020-03-29 NOTE — ED Notes (Signed)
PT is aware we need urine sample, urinal at bedside.

## 2020-03-29 NOTE — ED Notes (Signed)
Provider made aware of pain reassessment at this time and patient reporting 10/10 pain. New orders obtained at this time.

## 2020-03-29 NOTE — ED Triage Notes (Signed)
Pt reports right flank pain that started this AM. Denies hematuria. Pt reports trouble with urination. Hx of kidney stones.

## 2020-03-29 NOTE — Discharge Instructions (Signed)
Take the hydrocodone as needed for pain.  Also take Zofran as needed for nausea vomiting.  Make an appointment to follow-up with your urologist.  Return for any new or worse symptoms.  As we discussed she had a 5 mm stone in the distal ureter on the right side.

## 2020-12-26 ENCOUNTER — Encounter (HOSPITAL_COMMUNITY): Payer: Self-pay

## 2020-12-26 ENCOUNTER — Other Ambulatory Visit: Payer: Self-pay

## 2020-12-26 ENCOUNTER — Ambulatory Visit (HOSPITAL_COMMUNITY): Attending: Neurosurgery

## 2020-12-26 DIAGNOSIS — M6281 Muscle weakness (generalized): Secondary | ICD-10-CM | POA: Insufficient documentation

## 2020-12-26 DIAGNOSIS — R29898 Other symptoms and signs involving the musculoskeletal system: Secondary | ICD-10-CM | POA: Insufficient documentation

## 2020-12-26 DIAGNOSIS — M545 Low back pain, unspecified: Secondary | ICD-10-CM | POA: Diagnosis not present

## 2020-12-26 NOTE — Patient Instructions (Signed)
Access Code: [email or web address] URL: [email or web address] Date: 12/26/2020 Prepared by: Sherlyn Lees  Exercises Prone Press Up On Elbows - 1 x daily - 7 x weekly - 3 sets - 10 reps - 5-10 sec hold Prone Press Up - 1 x daily - 7 x weekly - 3 sets - 10 reps - 5-10 sec hold Supine Lower Trunk Rotation - 1 x daily - 7 x weekly - 3 sets

## 2020-12-26 NOTE — Therapy (Signed)
[place], [place], [address] Phone: [phone]   Fax:  [phone]  Physical Therapy Evaluation  Patient Details  Name: Adrian Marshall MRN: [id number] Date of Birth: 06/25/1955 Referring Provider (PT): Consuella Lose MD   Encounter Date: 12/26/2020   PT End of Session - 12/26/20 1508     Visit Number 1    Number of Visits 4    Date for PT Re-Evaluation 01/23/21    Authorization Type [employer] no auth, no VL    PT Start Time [id number]    PT Stop Time 1600    PT Time Calculation (min) 49 min    Activity Tolerance Patient tolerated treatment well    Behavior During Therapy [facility] for tasks assessed/performed             Past Medical History:  Diagnosis Date   Arthritis    NECK   BPH (benign prostatic hyperplasia)    Cancer ([place])    SKIN CANCER-BASAL   Coronary artery disease    History of kidney stones    Hypercholesterolemia    Hypothyroidism    Renal disorder    kidney stones   Sleep apnea    USES CPAP   Thyroid disease     Past Surgical History:  Procedure Laterality Date   ANKLE SURGERY     CYSTOSCOPY WITH RETROGRADE PYELOGRAM, URETEROSCOPY AND STENT PLACEMENT Left 08/16/2017   Procedure: CYSTOSCOPY WITH RETROGRADE PYELOGRAM, URETEROSCOPY AND STENT PLACEMENT;  Surgeon: Hollice Espy, MD;  Location: [facility];  Service: Urology;  Laterality: Left;   HERNIA REPAIR     KNEE SURGERY     NECK SURGERY     SHOULDER SURGERY     TRANSURETHRAL RESECTION OF PROSTATE N/A 07/20/2017   Procedure: TRANSURETHRAL RESECTION OF THE PROSTATE (TURP);  Surgeon: Abbie Sons, MD;  Location: [facility];  Service: Urology;  Laterality: N/A;   URETEROSCOPY WITH HOLMIUM LASER LITHOTRIPSY Left 08/16/2017   Procedure: URETEROSCOPY WITH HOLMIUM LASER LITHOTRIPSY;  Surgeon: Hollice Espy, MD;  Location: [facility];  Service: Urology;  Laterality: Left;   VASECTOMY      There were no vitals filed for this  visit.    Subjective Assessment - 12/26/20 1512     Subjective 2 year hx of back pain when he bent over to pick up item from ground.  Since this originial incident pt has had most recent flare-up in beginning of August but felling and notes continued pain in his right hip and low back. Notes pain and stiffness with prolonged sitting and improved symptoms once he is up and moving    Currently in Pain? Yes    Pain Score 0-No pain    Pain Location Back    Pain Orientation Lower    Pain Descriptors / Indicators Burning;Aching    Pain Type Acute pain    Pain Radiating Towards recently radiating into bilateral posterior thighs, not below knee    Pain Onset 1 to 4 weeks ago    Aggravating Factors  bending forward    Pain Relieving Factors bed rest                [facility] PT Assessment - 12/26/20 0001       Assessment   Medical Diagnosis LBP    Referring Provider (PT) Consuella Lose MD      Balance Screen   Has the patient fallen in the past 6 months Yes    How many  times? 1    Has the patient had a decrease in activity level because of a fear of falling?  No    Is the patient reluctant to leave their home because of a fear of falling?  No      Home Ecologist residence      Prior Function   Level of [place] Retired      Observation/Other Assessments   Observations decreased lumbar lordosis present      ROM / Strength   AROM / PROM / Strength Strength;AROM      AROM   AROM Assessment Site Lumbar    Lumbar Flexion 25% limitation    Lumbar Extension WNL    Lumbar - Right Side Bend WNL, painful    Lumbar - Left Side Bend WNL, painful      Strength   Overall Strength Comments BLE 5/5 gross strength    Strength Assessment Site Lumbar    Lumbar Flexion 3/5    Lumbar Extension 3/5      Palpation   Spinal mobility decreased lumbar PA via PIVM, very tight and stiff lumbar paraspinals      Special Tests     Special Tests Lumbar    Lumbar Tests Prone Knee Bend Test;Straight Leg Raise      Prone Knee Bend Test   Findings Negative      Straight Leg Raise   Findings Negative                        Objective measurements completed on examination: See above findings.                PT Education - 12/26/20 1557     Education Details education on lumbar anatomy and body mechanics and HEP initation    Person(s) Educated Patient    Methods Explanation;Handout    Comprehension Verbalized understanding              PT Short Term Goals - 12/26/20 1609       PT SHORT TERM GOAL #1   Title Patient will be independent with HEP in order to improve functional outcomes.    Time 2    Period Weeks    Status New    Target Date 01/09/21      PT SHORT TERM GOAL #2   Title Patient will report no pain radiating into LE to decrease pain and demonstrate centralization of pain    Baseline instances of radiating pain into bilateral thighs    Time 2    Period Weeks    Status New    Target Date 01/09/21               PT Long Term Goals - 12/26/20 1611       PT LONG TERM GOAL #1   Title Patient will improve FOTO score by at least 5 points in order to indicate improved tolerance to activity.    Baseline unable to complete on day of evaluation    Time 4    Period Weeks    Status New    Target Date 01/23/21      PT LONG TERM GOAL #2   Title Demo improved trunk strength to 4/5 to improve lumbar stability and reduce risk for re-injury    Baseline 3/5    Time 4    Period Weeks    Status New  Target Date 01/23/21                    Plan - 12/26/20 1559     Clinical Impression Statement Patient is a 65 yo male presenting to physical therapy with c/o acute on chronic LBP. He presents with pain limited deficits in core/trunk strength, ROM, endurance, postural impairments, spinal mobility and functional mobility with ADL. He is having to modify and  restrict ADL as indicated by FOTO score as well as subjective information and objective measures which is affecting overall participation. Patient will benefit from skilled physical therapy in order to improve function and reduce impairment.    Personal Factors and Comorbidities Age;Past/Current Experience;Time since onset of injury/illness/exacerbation    Examination-Activity Limitations Bend;Carry;Lift;Squat;Locomotion Level    Examination-Participation Restrictions Cleaning;Community Activity;Yard Work    Stability/Clinical Decision Making Stable/Uncomplicated    Designer, jewellery Low    Rehab Potential Good    PT Frequency 1x / week    PT Duration 4 weeks    PT Treatment/Interventions ADLs/Self Care Home Management;Electrical Stimulation;Traction;Gait training;Stair training;Functional mobility training;Therapeutic activities;Therapeutic exercise;Balance training;Patient/family education;Neuromuscular re-education;Manual techniques;Dry needling;Spinal Manipulations;Joint Manipulations    PT Next Visit Plan Continue with lumbar ROM, extension bias, core strength    PT Home Exercise Plan prone, prone elbows, prone press-ups, LTR    Consulted and Agree with Plan of Care Patient             Patient will benefit from skilled therapeutic intervention in order to improve the following deficits and impairments:  Decreased strength, Decreased range of motion, Hypomobility, Pain, Postural dysfunction, Increased muscle spasms, Improper body mechanics  Visit Diagnosis: Acute midline low back pain, unspecified whether sciatica present  Muscle weakness (generalized)  Other symptoms and signs involving the musculoskeletal system     Problem List Patient Active Problem List   Diagnosis Date Noted   Sleep apnea 04/02/2018   Ureteral calculus 08/15/2017   S/P TURP 07/20/2017   Nephrolithiasis 06/17/2017   4:16 PM, 12/26/20 M. Sherlyn Lees, PT, DPT Physical Therapist-  [place] Office Number: [phone]   [place] [address] [place], [place], [address] Phone: [phone]   Fax:  [phone]  Name: Adrian Marshall MRN: [id number] Date of Birth: December 22, 1955

## 2021-01-04 ENCOUNTER — Other Ambulatory Visit: Payer: Self-pay

## 2021-01-04 ENCOUNTER — Ambulatory Visit (HOSPITAL_COMMUNITY)

## 2021-01-04 DIAGNOSIS — M545 Low back pain, unspecified: Secondary | ICD-10-CM | POA: Diagnosis not present

## 2021-01-04 DIAGNOSIS — M6281 Muscle weakness (generalized): Secondary | ICD-10-CM

## 2021-01-04 DIAGNOSIS — R29898 Other symptoms and signs involving the musculoskeletal system: Secondary | ICD-10-CM

## 2021-01-04 NOTE — Therapy (Signed)
[place], [place], [address] Phone: [phone]   Fax:  [phone]  Physical Therapy Treatment  Patient Details  Name: Adrian Marshall MRN: [id number] Date of Birth: 10/13/1955 Referring Provider (PT): Consuella Lose MD   Encounter Date: 01/04/2021   PT End of Session - 01/04/21 0902     Visit Number 2    Number of Visits 4    Date for PT Re-Evaluation 01/23/21    Authorization Type [employer] no auth, no VL    PT Start Time 0900    PT Stop Time 0945    PT Time Calculation (min) 45 min    Activity Tolerance Patient tolerated treatment well    Behavior During Therapy [facility] for tasks assessed/performed             Past Medical History:  Diagnosis Date   Arthritis    NECK   BPH (benign prostatic hyperplasia)    Cancer ([place])    SKIN CANCER-BASAL   Coronary artery disease    History of kidney stones    Hypercholesterolemia    Hypothyroidism    Renal disorder    kidney stones   Sleep apnea    USES CPAP   Thyroid disease     Past Surgical History:  Procedure Laterality Date   ANKLE SURGERY     CYSTOSCOPY WITH RETROGRADE PYELOGRAM, URETEROSCOPY AND STENT PLACEMENT Left 08/16/2017   Procedure: CYSTOSCOPY WITH RETROGRADE PYELOGRAM, URETEROSCOPY AND STENT PLACEMENT;  Surgeon: Hollice Espy, MD;  Location: [facility];  Service: Urology;  Laterality: Left;   HERNIA REPAIR     KNEE SURGERY     NECK SURGERY     SHOULDER SURGERY     TRANSURETHRAL RESECTION OF PROSTATE N/A 07/20/2017   Procedure: TRANSURETHRAL RESECTION OF THE PROSTATE (TURP);  Surgeon: Abbie Sons, MD;  Location: [facility];  Service: Urology;  Laterality: N/A;   URETEROSCOPY WITH HOLMIUM LASER LITHOTRIPSY Left 08/16/2017   Procedure: URETEROSCOPY WITH HOLMIUM LASER LITHOTRIPSY;  Surgeon: Hollice Espy, MD;  Location: [facility];  Service: Urology;  Laterality: Left;   VASECTOMY      There were no vitals filed for this  visit.   Subjective Assessment - 01/04/21 0902     Subjective No issues today.  Notes that episodes have been sporadic and notes no back or hip pain at this present time    Currently in Pain? No/denies    Pain Score 0-No pain    Pain Location Back    Pain Orientation Lower    Pain Descriptors / Indicators Burning;Aching    Pain Onset 1 to 4 weeks ago                               [facility] Adult PT Treatment/Exercise - 01/04/21 0001       Exercises   Exercises Lumbar      Lumbar Exercises: Stretches   Prone on Elbows Stretch 5 reps;20 seconds    Press Ups 20 reps;5 seconds      Lumbar Exercises: Aerobic   Nustep level 4 x 5 min      Lumbar Exercises: Standing   Lifting From floor;20 reps    Lifting Weights (lbs) 5-15    Other Standing Lumbar Exercises Golfer's pickup 2x5 left/right      Lumbar Exercises: Seated   Sit to Stand 10 reps      Lumbar Exercises: Supine  Bent Knee Raise 15 reps                       PT Short Term Goals - 12/26/20 1609       PT SHORT TERM GOAL #1   Title Patient will be independent with HEP in order to improve functional outcomes.    Time 2    Period Weeks    Status New    Target Date 01/09/21      PT SHORT TERM GOAL #2   Title Patient will report no pain radiating into LE to decrease pain and demonstrate centralization of pain    Baseline instances of radiating pain into bilateral thighs    Time 2    Period Weeks    Status New    Target Date 01/09/21               PT Long Term Goals - 12/26/20 1611       PT LONG TERM GOAL #1   Title Patient will improve FOTO score by at least 5 points in order to indicate improved tolerance to activity.    Baseline unable to complete on day of evaluation    Time 4    Period Weeks    Status New    Target Date 01/23/21      PT LONG TERM GOAL #2   Title Demo improved trunk strength to 4/5 to improve lumbar stability and reduce risk for re-injury     Baseline 3/5    Time 4    Period Weeks    Status New    Target Date 01/23/21                   Plan - 01/04/21 9476     Clinical Impression Statement Pt presents today with no increase in pain and notes that he is overall feeling better.  Continued with POC with details focus on body mechanics and proper lifting form to prevent rounding of back and lumbar strain. Good performance lifting heavy box with minimal cues after demo. Continued with Golfer's pick up for body mechanics requiring UE support. COntinued sessions indicated to progress lumbar strengthening and body mechanics to prevent re-injury to back    Personal Factors and Comorbidities Age;Past/Current Experience;Time since onset of injury/illness/exacerbation    Examination-Activity Limitations Bend;Carry;Lift;Squat;Locomotion Level    Examination-Participation Restrictions Cleaning;Community Activity;Yard Work    Stability/Clinical Decision Making Stable/Uncomplicated    Rehab Potential Good    PT Frequency 1x / week    PT Duration 4 weeks    PT Treatment/Interventions ADLs/Self Care Home Management;Electrical Stimulation;Traction;Gait training;Stair training;Functional mobility training;Therapeutic activities;Therapeutic exercise;Balance training;Patient/family education;Neuromuscular re-education;Manual techniques;Dry needling;Spinal Manipulations;Joint Manipulations    PT Next Visit Plan Continue with lumbar ROM, extension bias, core strength    PT Home Exercise Plan prone, prone elbows, prone press-ups, LTR    Consulted and Agree with Plan of Care Patient             Patient will benefit from skilled therapeutic intervention in order to improve the following deficits and impairments:  Decreased strength, Decreased range of motion, Hypomobility, Pain, Postural dysfunction, Increased muscle spasms, Improper body mechanics  Visit Diagnosis: Acute midline low back pain, unspecified whether sciatica present  Muscle  weakness (generalized)  Other symptoms and signs involving the musculoskeletal system     Problem List Patient Active Problem List   Diagnosis Date Noted   Sleep apnea 04/02/2018   Ureteral calculus 08/15/2017  S/P TURP 07/20/2017   Nephrolithiasis 06/17/2017    Toniann Fail, PT 01/04/2021, 9:40 AM  [place] [address] [place], [place], [address] Phone: [phone]   Fax:  [phone]  Name: Adrian Marshall MRN: [id number] Date of Birth: 1955/07/27

## 2021-01-07 ENCOUNTER — Other Ambulatory Visit: Payer: Self-pay

## 2021-01-07 ENCOUNTER — Ambulatory Visit (HOSPITAL_COMMUNITY)

## 2021-01-07 DIAGNOSIS — M6281 Muscle weakness (generalized): Secondary | ICD-10-CM

## 2021-01-07 DIAGNOSIS — R29898 Other symptoms and signs involving the musculoskeletal system: Secondary | ICD-10-CM

## 2021-01-07 DIAGNOSIS — M545 Low back pain, unspecified: Secondary | ICD-10-CM | POA: Diagnosis not present

## 2021-01-07 NOTE — Therapy (Signed)
[place], [place], [address] Phone: [phone]   Fax:  [phone]  Physical Therapy Treatment  Patient Details  Name: Adrian Marshall MRN: [id number] Date of Birth: June 07, 1955 Referring Provider (PT): Consuella Lose MD   Encounter Date: 01/07/2021   PT End of Session - 01/07/21 1034     Visit Number 3    Number of Visits 4    Date for PT Re-Evaluation 01/23/21    Authorization Type [employer] no auth, no VL    PT Start Time 1030    PT Stop Time 1115    PT Time Calculation (min) 45 min    Activity Tolerance Patient tolerated treatment well    Behavior During Therapy [facility] for tasks assessed/performed             Past Medical History:  Diagnosis Date   Arthritis    NECK   BPH (benign prostatic hyperplasia)    Cancer ([place])    SKIN CANCER-BASAL   Coronary artery disease    History of kidney stones    Hypercholesterolemia    Hypothyroidism    Renal disorder    kidney stones   Sleep apnea    USES CPAP   Thyroid disease     Past Surgical History:  Procedure Laterality Date   ANKLE SURGERY     CYSTOSCOPY WITH RETROGRADE PYELOGRAM, URETEROSCOPY AND STENT PLACEMENT Left 08/16/2017   Procedure: CYSTOSCOPY WITH RETROGRADE PYELOGRAM, URETEROSCOPY AND STENT PLACEMENT;  Surgeon: Hollice Espy, MD;  Location: [facility];  Service: Urology;  Laterality: Left;   HERNIA REPAIR     KNEE SURGERY     NECK SURGERY     SHOULDER SURGERY     TRANSURETHRAL RESECTION OF PROSTATE N/A 07/20/2017   Procedure: TRANSURETHRAL RESECTION OF THE PROSTATE (TURP);  Surgeon: Abbie Sons, MD;  Location: [facility];  Service: Urology;  Laterality: N/A;   URETEROSCOPY WITH HOLMIUM LASER LITHOTRIPSY Left 08/16/2017   Procedure: URETEROSCOPY WITH HOLMIUM LASER LITHOTRIPSY;  Surgeon: Hollice Espy, MD;  Location: [facility];  Service: Urology;  Laterality: Left;   VASECTOMY      There were no vitals filed for this  visit.   Subjective Assessment - 01/07/21 1033     Subjective No issues, I've been walking everyday since last therapy session    Currently in Pain? No/denies    Pain Score 0-No pain    Pain Location Back    Pain Orientation Lower    Pain Onset 1 to 4 weeks ago                               [name] - 01/07/21 0001       Lumbar Exercises: Aerobic   Tread Mill 3 mph x 5 min for dynamic warm-up      Lumbar Exercises: Standing   Lifting From floor;20 reps    Lifting Weights (lbs) 5-15    Lifting Limitations various sized boxes to improve lifting form and body mechanics    Other Standing Lumbar Exercises Golfer's pickup 2x5 left/right      Lumbar Exercises: Quadruped   Plank 3x15 sec                     PT Education - 01/07/21 1105     Education Details continued education on lifting mechanics and continued HEP    Person(s) Educated Patient  Methods Explanation    Comprehension Verbalized understanding              PT Short Term Goals - 12/26/20 1609       PT SHORT TERM GOAL #1   Title Patient will be independent with HEP in order to improve functional outcomes.    Time 2    Period Weeks    Status New    Target Date 01/09/21      PT SHORT TERM GOAL #2   Title Patient will report no pain radiating into LE to decrease pain and demonstrate centralization of pain    Baseline instances of radiating pain into bilateral thighs    Time 2    Period Weeks    Status New    Target Date 01/09/21               PT Long Term Goals - 12/26/20 1611       PT LONG TERM GOAL #1   Title Patient will improve FOTO score by at least 5 points in order to indicate improved tolerance to activity.    Baseline unable to complete on day of evaluation    Time 4    Period Weeks    Status New    Target Date 01/23/21      PT LONG TERM GOAL #2   Title Demo improved trunk strength to 4/5 to improve lumbar stability and  reduce risk for re-injury    Baseline 3/5    Time 4    Period Weeks    Status New    Target Date 01/23/21                   Plan - 01/07/21 1110     Clinical Impression Statement Pt tolerating tx sessions well and has not experienced acute exacerbation of LBP since resuming activities. Progressing with core strengtheining exercises with onset of fatigue after 15-30 sec with static positions and 10-15 sec for dynamic stabilization activities. Continued session for HEP refinement    Personal Factors and Comorbidities Age;Past/Current Experience;Time since onset of injury/illness/exacerbation    Examination-Activity Limitations Bend;Carry;Lift;Squat;Locomotion Level    Examination-Participation Restrictions Cleaning;Community Activity;Yard Work    Stability/Clinical Decision Making Stable/Uncomplicated    Rehab Potential Good    PT Frequency 1x / week    PT Duration 4 weeks    PT Treatment/Interventions ADLs/Self Care Home Management;Electrical Stimulation;Traction;Gait training;Stair training;Functional mobility training;Therapeutic activities;Therapeutic exercise;Balance training;Patient/family education;Neuromuscular re-education;Manual techniques;Dry needling;Spinal Manipulations;Joint Manipulations    PT Next Visit Plan Continue with lumbar ROM, extension bias, core strength    PT Home Exercise Plan prone, prone elbows, prone press-ups, LTR, front plank    Consulted and Agree with Plan of Care Patient             Patient will benefit from skilled therapeutic intervention in order to improve the following deficits and impairments:  Decreased strength, Decreased range of motion, Hypomobility, Pain, Postural dysfunction, Increased muscle spasms, Improper body mechanics  Visit Diagnosis: Acute midline low back pain, unspecified whether sciatica present  Muscle weakness (generalized)  Other symptoms and signs involving the musculoskeletal system     Problem  List Patient Active Problem List   Diagnosis Date Noted   Sleep apnea 04/02/2018   Ureteral calculus 08/15/2017   S/P TURP 07/20/2017   Nephrolithiasis 06/17/2017    Toniann Fail, PT 01/07/2021, 11:17 AM  [place], [place], [address] Phone: [phone]  Fax:  [phone]  Name: Adrian Marshall MRN: [id number] Date of Birth: 12/10/1955

## 2021-01-15 ENCOUNTER — Other Ambulatory Visit: Payer: Self-pay

## 2021-01-15 ENCOUNTER — Encounter (HOSPITAL_COMMUNITY): Payer: Self-pay

## 2021-01-15 ENCOUNTER — Ambulatory Visit (HOSPITAL_COMMUNITY): Payer: Medicare Other | Attending: Neurosurgery

## 2021-01-15 DIAGNOSIS — M545 Low back pain, unspecified: Secondary | ICD-10-CM | POA: Insufficient documentation

## 2021-01-15 DIAGNOSIS — R29898 Other symptoms and signs involving the musculoskeletal system: Secondary | ICD-10-CM | POA: Insufficient documentation

## 2021-01-15 DIAGNOSIS — M6281 Muscle weakness (generalized): Secondary | ICD-10-CM | POA: Diagnosis present

## 2021-01-15 NOTE — Therapy (Signed)
[facility] [address] [place], [place], [address] Phone: [phone]   Fax:  [phone]  Physical Therapy Treatment and D/C Summary  Patient Details  Name: Adrian Marshall MRN: [id number] Date of Birth: [birth year] Referring Provider (PT): Consuella Lose MD  PHYSICAL THERAPY DISCHARGE SUMMARY  Visits from Start of Care: 4  Current functional level related to goals / functional outcomes: Met all STG/LTG and reports minimal to no symptoms   Remaining deficits: none   Education / Equipment: HEP   Patient agrees to discharge. Patient goals were met. Patient is being discharged due to meeting the stated rehab goals.  Encounter Date: 01/15/2021   PT End of Session - 01/15/21 0900     Visit Number 4    Number of Visits 4    Date for PT Re-Evaluation 01/23/21    Authorization Type [employer] no auth, no VL    PT Start Time 0900    PT Stop Time 0930   D/C visit   PT Time Calculation (min) 30 min    Activity Tolerance Patient tolerated treatment well    Behavior During Therapy WFL for tasks assessed/performed             Past Medical History:  Diagnosis Date   Arthritis    NECK   BPH (benign prostatic hyperplasia)    Cancer ([facility])    SKIN CANCER-BASAL   Coronary artery disease    History of kidney stones    Hypercholesterolemia    Hypothyroidism    Renal disorder    kidney stones   Sleep apnea    USES CPAP   Thyroid disease     Past Surgical History:  Procedure Laterality Date   ANKLE SURGERY     CYSTOSCOPY WITH RETROGRADE PYELOGRAM, URETEROSCOPY AND STENT PLACEMENT Left 08/16/2017   Procedure: CYSTOSCOPY WITH RETROGRADE PYELOGRAM, URETEROSCOPY AND STENT PLACEMENT;  Surgeon: Hollice Espy, MD;  Location: [facility];  Service: Urology;  Laterality: Left;   HERNIA REPAIR     KNEE SURGERY     NECK SURGERY     SHOULDER SURGERY     TRANSURETHRAL RESECTION OF PROSTATE N/A 07/20/2017   Procedure: TRANSURETHRAL  RESECTION OF THE PROSTATE (TURP);  Surgeon: Abbie Sons, MD;  Location: [facility];  Service: Urology;  Laterality: N/A;   URETEROSCOPY WITH HOLMIUM LASER LITHOTRIPSY Left 08/16/2017   Procedure: URETEROSCOPY WITH HOLMIUM LASER LITHOTRIPSY;  Surgeon: Hollice Espy, MD;  Location: [facility];  Service: Urology;  Laterality: Left;   VASECTOMY      There were no vitals filed for this visit.   Subjective Assessment - 01/15/21 0925     Subjective No issues, I've been walking everyday since last therapy session    Pain Onset 1 to 4 weeks ago                [facility] PT Assessment - 01/15/21 0001       Assessment   Medical Diagnosis LBP    Referring Provider (PT) Consuella Lose MD                           [facility] Adult PT Treatment/Exercise - 01/15/21 0001       Lumbar Exercises: Supine   Bent Knee Raise 20 reps      Lumbar Exercises: Sidelying   Other Sidelying Lumbar Exercises side plank 1x30      Lumbar Exercises: Quadruped   Plank 1x60 sec  PT Education - 01/15/21 0930     Education Details education in HEP progression for core stabilization    Person(s) Educated Patient    Methods Explanation;Demonstration;Handout    Comprehension Verbalized understanding;Returned demonstration              PT Short Term Goals - 01/15/21 0912       PT SHORT TERM GOAL #1   Title Patient will be independent with HEP in order to improve functional outcomes.    Time 2    Period Weeks    Status Achieved    Target Date 01/09/21      PT SHORT TERM GOAL #2   Title Patient will report no pain radiating into LE to decrease pain and demonstrate centralization of pain    Baseline no radiating pain    Time 2    Period Weeks    Status Achieved    Target Date 01/09/21               PT Long Term Goals - 01/15/21 0913       PT LONG TERM GOAL #1   Title Patient will improve FOTO score by at least 5 points in order to  indicate improved tolerance to activity.    Baseline 94% function    Time 4    Period Weeks    Status Achieved      PT LONG TERM GOAL #2   Title Demo improved trunk strength to 4/5 to improve lumbar stability and reduce risk for re-injury    Baseline 4/5 flexion/extension    Time 4    Period Weeks    Status Achieved                   Plan - 01/15/21 0931     Clinical Impression Statement Pt has progressed very well with POC details and reports no flare-up issues and denies any radiating LE pain.  Improved core endurance evident by front plank x 60 sec without issue and demonstrates good lifting mechanics and reports no issues. Pt will D/C to HEP    Personal Factors and Comorbidities Age;Past/Current Experience;Time since onset of injury/illness/exacerbation    Examination-Activity Limitations Bend;Carry;Lift;Squat;Locomotion Level    Examination-Participation Restrictions Cleaning;Community Activity;Yard Work    Stability/Clinical Decision Making Stable/Uncomplicated    Rehab Potential Good    PT Frequency 1x / week    PT Duration 4 weeks    PT Treatment/Interventions ADLs/Self Care Home Management;Electrical Stimulation;Traction;Gait training;Stair training;Functional mobility training;Therapeutic activities;Therapeutic exercise;Balance training;Patient/family education;Neuromuscular re-education;Manual techniques;Dry needling;Spinal Manipulations;Joint Manipulations    PT Next Visit Plan Continue with lumbar ROM, extension bias, core strength    PT Home Exercise Plan prone, prone elbows, prone press-ups, LTR, front plank    Consulted and Agree with Plan of Care Patient             Patient will benefit from skilled therapeutic intervention in order to improve the following deficits and impairments:  Decreased strength, Decreased range of motion, Hypomobility, Pain, Postural dysfunction, Increased muscle spasms, Improper body mechanics  Visit Diagnosis: Acute midline  low back pain, unspecified whether sciatica present  Muscle weakness (generalized)  Other symptoms and signs involving the musculoskeletal system     Problem List Patient Active Problem List   Diagnosis Date Noted   Sleep apnea 04/02/2018   Ureteral calculus 08/15/2017   S/P TURP 07/20/2017   Nephrolithiasis 06/17/2017   9:34 AM, 01/15/21 M. Sherlyn Lees, PT, DPT Physical Therapist- [place] Office Number: [phone]  [place], [place], [address] Phone: [phone]   Fax:  [phone]  Name: DELWIN RACZKOWSKI MRN: [id number] Date of Birth: 04-08-1956

## 2021-01-15 NOTE — Patient Instructions (Signed)
Access Code: [email or web address] URL: [email or web address] Date: 01/15/2021 Prepared by: Sherlyn Lees  Exercises Side Plank on Elbow - 1 x daily - 7 x weekly - 3 sets - 10 reps Modified Side Plank with Hip Abduction - 1 x daily - 7 x weekly - 3 sets - 10 reps Side Plank with Arm Bar - 1 x daily - 7 x weekly - 3 sets - 10 reps

## 2021-01-22 ENCOUNTER — Encounter (HOSPITAL_COMMUNITY)

## 2021-01-31 ENCOUNTER — Other Ambulatory Visit: Payer: Self-pay

## 2021-01-31 ENCOUNTER — Encounter (HOSPITAL_COMMUNITY): Payer: Self-pay | Admitting: Emergency Medicine

## 2021-01-31 ENCOUNTER — Emergency Department (HOSPITAL_COMMUNITY): Payer: Medicare Other

## 2021-01-31 ENCOUNTER — Emergency Department (HOSPITAL_COMMUNITY)
Admission: EM | Admit: 2021-01-31 | Discharge: 2021-01-31 | Disposition: A | Discharge status: Home / Self Care | Payer: Medicare Other | Attending: Emergency Medicine | Admitting: Emergency Medicine

## 2021-01-31 DIAGNOSIS — R109 Unspecified abdominal pain: Secondary | ICD-10-CM | POA: Diagnosis present

## 2021-01-31 DIAGNOSIS — N390 Urinary tract infection, site not specified: Secondary | ICD-10-CM | POA: Diagnosis not present

## 2021-01-31 DIAGNOSIS — E039 Hypothyroidism, unspecified: Secondary | ICD-10-CM | POA: Insufficient documentation

## 2021-01-31 DIAGNOSIS — I2510 Atherosclerotic heart disease of native coronary artery without angina pectoris: Secondary | ICD-10-CM | POA: Diagnosis not present

## 2021-01-31 DIAGNOSIS — Z7982 Long term (current) use of aspirin: Secondary | ICD-10-CM | POA: Insufficient documentation

## 2021-01-31 DIAGNOSIS — Z85828 Personal history of other malignant neoplasm of skin: Secondary | ICD-10-CM | POA: Insufficient documentation

## 2021-01-31 LAB — URINALYSIS, MICROSCOPIC (REFLEX): WBC, UA: >50 WBC/hpf (ref 0–5)

## 2021-01-31 LAB — CBC WITH DIFFERENTIAL/PLATELET
Abs Immature Granulocytes: 0.28 10*3/uL — ABNORMAL HIGH (ref 0.00–0.07)
Basophils Absolute: 0.1 10*3/uL (ref 0.0–0.1)
Basophils Relative: 1 %
Eosinophils Absolute: 0 10*3/uL (ref 0.0–0.5)
Eosinophils Relative: 0 %
HCT: 44.3 % (ref 39.0–52.0)
Hemoglobin: 15 g/dL (ref 13.0–17.0)
Immature Granulocytes: 1 %
Lymphocytes Relative: 2 %
Lymphs Abs: 0.5 10*3/uL — ABNORMAL LOW (ref 0.7–4.0)
MCH: 30.5 pg (ref 26.0–34.0)
MCHC: 33.9 g/dL (ref 30.0–36.0)
MCV: 90 fL (ref 80.0–100.0)
Monocytes Absolute: 0.9 10*3/uL (ref 0.1–1.0)
Monocytes Relative: 4 %
Neutro Abs: 18 10*3/uL — ABNORMAL HIGH (ref 1.7–7.7)
Neutrophils Relative %: 92 %
Platelets: 144 10*3/uL — ABNORMAL LOW (ref 150–400)
RBC: 4.92 MIL/uL (ref 4.22–5.81)
RDW: 13.3 % (ref 11.5–15.5)
WBC: 19.8 10*3/uL — ABNORMAL HIGH (ref 4.0–10.5)
nRBC: 0 % (ref 0.0–0.2)

## 2021-01-31 LAB — URINALYSIS, ROUTINE W REFLEX MICROSCOPIC
Glucose, UA: NEGATIVE mg/dL
Ketones, ur: 15 mg/dL — AB
Nitrite: NEGATIVE
Protein, ur: 100 mg/dL — AB
Specific Gravity, Urine: >1.03 — ABNORMAL HIGH (ref 1.005–1.030)
pH: 5.5 (ref 5.0–8.0)

## 2021-01-31 LAB — COMPREHENSIVE METABOLIC PANEL
ALT: 23 U/L (ref 0–44)
AST: 23 U/L (ref 15–41)
Albumin: 3.5 g/dL (ref 3.5–5.0)
Alkaline Phosphatase: 81 U/L (ref 38–126)
Anion gap: 12 (ref 5–15)
BUN: 44 mg/dL — ABNORMAL HIGH (ref 8–23)
CO2: 19 mmol/L — ABNORMAL LOW (ref 22–32)
Calcium: 7.9 mg/dL — ABNORMAL LOW (ref 8.9–10.3)
Chloride: 98 mmol/L (ref 98–111)
Creatinine, Ser: 2.16 mg/dL — ABNORMAL HIGH (ref 0.61–1.24)
GFR, Estimated: 33 mL/min — ABNORMAL LOW (ref >60)
Glucose, Bld: 152 mg/dL — ABNORMAL HIGH (ref 70–99)
Potassium: 3.6 mmol/L (ref 3.5–5.1)
Sodium: 129 mmol/L — ABNORMAL LOW (ref 135–145)
Total Bilirubin: 3.8 mg/dL — ABNORMAL HIGH (ref 0.3–1.2)
Total Protein: 6.6 g/dL (ref 6.5–8.1)

## 2021-01-31 MED ORDER — SULFAMETHOXAZOLE-TRIMETHOPRIM 800-160 MG PO TABS: ORAL_TABLET | ORAL | 0 refills | Status: DC

## 2021-01-31 MED ORDER — KETOROLAC TROMETHAMINE 30 MG/ML IJ SOLN
15.0000 mg | Freq: Once | INTRAMUSCULAR | Status: AC
  Administered 2021-01-31: 15 mg via INTRAVENOUS
  Filled 2021-01-31: qty 1

## 2021-01-31 MED ORDER — ONDANSETRON HCL 4 MG/2ML IJ SOLN
4.0000 mg | Freq: Once | INTRAMUSCULAR | Status: AC
  Administered 2021-01-31: 4 mg via INTRAVENOUS
  Filled 2021-01-31: qty 2

## 2021-01-31 MED ORDER — SODIUM CHLORIDE 0.9 % IV BOLUS
500.0000 mL | Freq: Once | INTRAVENOUS | Status: AC
  Administered 2021-01-31: 500 mL via INTRAVENOUS

## 2021-01-31 MED ORDER — SODIUM CHLORIDE 0.9 % IV SOLN
2.0000 g | Freq: Once | INTRAVENOUS | Status: AC
  Administered 2021-01-31: 2 g via INTRAVENOUS
  Filled 2021-01-31: qty 20

## 2021-01-31 NOTE — ED Triage Notes (Signed)
Pt c/o of penial pain/ scrotum pain x2days. Pt says he has had 15 kidney stones in the past and this feels like they have before. Pt states he can only dribble urine and small clots of blood.

## 2021-01-31 NOTE — ED Provider Notes (Signed)
[facility] EMERGENCY DEPARTMENT Provider Note   CSN: [id number] Arrival date & time: 01/31/21  6384     History Chief Complaint  Patient presents with   Flank Pain    Adrian Marshall is a 65 y.o. male.  Patient complains of pain in his scrotum and his penis.  Patient states that he has had kidney stones before  The history is provided by the patient and medical records. No language interpreter was used.  Flank Pain This is a new problem. The current episode started 12 to 24 hours ago. The problem occurs constantly. The problem has not changed since onset.Pertinent negatives include no chest pain, no abdominal pain, no headaches and no shortness of breath. Nothing aggravates the symptoms. Nothing relieves the symptoms. He has tried nothing for the symptoms. The treatment provided no relief.      Past Medical History:  Diagnosis Date   Arthritis    NECK   BPH (benign prostatic hyperplasia)    Cancer ([facility])    SKIN CANCER-BASAL   Coronary artery disease    History of kidney stones    Hypercholesterolemia    Hypothyroidism    Renal disorder    kidney stones   Sleep apnea    USES CPAP   Thyroid disease     Patient Active Problem List   Diagnosis Date Noted   Sleep apnea 04/02/2018   Ureteral calculus 08/15/2017   S/P TURP 07/20/2017   Nephrolithiasis 06/17/2017    Past Surgical History:  Procedure Laterality Date   ANKLE SURGERY     CYSTOSCOPY WITH RETROGRADE PYELOGRAM, URETEROSCOPY AND STENT PLACEMENT Left 08/16/2017   Procedure: CYSTOSCOPY WITH RETROGRADE PYELOGRAM, URETEROSCOPY AND STENT PLACEMENT;  Surgeon: Hollice Espy, MD;  Location: [facility];  Service: Urology;  Laterality: Left;   HERNIA REPAIR     KNEE SURGERY     NECK SURGERY     SHOULDER SURGERY     TRANSURETHRAL RESECTION OF PROSTATE N/A 07/20/2017   Procedure: TRANSURETHRAL RESECTION OF THE PROSTATE (TURP);  Surgeon: Abbie Sons, MD;  Location: [facility];  Service: Urology;  Laterality: N/A;    URETEROSCOPY WITH HOLMIUM LASER LITHOTRIPSY Left 08/16/2017   Procedure: URETEROSCOPY WITH HOLMIUM LASER LITHOTRIPSY;  Surgeon: Hollice Espy, MD;  Location: [facility];  Service: Urology;  Laterality: Left;   VASECTOMY         Family History  Problem Relation Age of Onset   Prostate cancer Neg Hx    Bladder Cancer Neg Hx    Kidney cancer Neg Hx     Social History   Tobacco Use   Smoking status: Never   Smokeless tobacco: Never  Vaping Use   Vaping Use: Never used  Substance Use Topics   Alcohol use: Yes    Comment: RARE   Drug use: No    Home Medications Prior to Admission medications   Medication Sig Start Date End Date Taking? Authorizing Provider  acetaminophen (TYLENOL 8 HOUR) 650 MG CR tablet Take 1 tablet (650 mg total) by mouth every 8 (eight) hours as needed for pain. 09/17/19  Yes Suzy Bouchard, PA-C  aspirin EC 81 MG tablet Take 81 mg by mouth daily.   Yes [provider]  atorvastatin (LIPITOR) 80 MG tablet Take 80 mg by mouth every evening.    Yes [provider]  levothyroxine (SYNTHROID) 88 MCG tablet Take 176 mcg by mouth daily before breakfast.   Yes [provider]  sulfamethoxazole-trimethoprim (BACTRIM DS) 800-160 MG tablet  Take one po bid for 28 days 01/31/21  Yes Milton Ferguson, MD  tamsulosin (FLOMAX) 0.4 MG CAPS capsule Take 0.4 mg by mouth 2 (two) times daily.    Yes [provider]  UROCIT-K 10 10 MEQ (1080 MG) SR tablet Take 10 mEq by mouth 2 (two) times daily with a meal. 02/27/20  Yes [provider]  cyclobenzaprine (FLEXERIL) 10 MG tablet Take 1 tablet (10 mg total) by mouth 2 (two) times daily as needed for muscle spasms. Patient not taking: No sig reported 08/09/19   Elnora Morrison, MD  doxycycline (VIBRAMYCIN) 100 MG capsule Take 1 capsule (100 mg total) by mouth 2 (two) times daily. Patient not taking: No sig reported 05/22/19   [place], [place], PA-C  HYDROcodone-acetaminophen (NORCO) 5-325 MG  tablet Take 1-2 tablets by mouth every 6 (six) hours as needed for severe pain. Patient not taking: No sig reported 08/09/19   Elnora Morrison, MD  HYDROcodone-acetaminophen (NORCO/VICODIN) 5-325 MG tablet Take 1 tablet by mouth every 6 (six) hours as needed for severe pain. Patient not taking: No sig reported 09/17/19   Suzy Bouchard, PA-C  HYDROcodone-acetaminophen (NORCO/VICODIN) 5-325 MG tablet Take 1 tablet by mouth every 6 (six) hours as needed for moderate pain. Patient not taking: Reported on 01/31/2021 03/29/20   Fredia Sorrow, MD  ondansetron (ZOFRAN ODT) 4 MG disintegrating tablet Take 1 tablet (4 mg total) by mouth every 8 (eight) hours as needed. Patient not taking: Reported on 01/31/2021 03/29/20   Fredia Sorrow, MD    Allergies    Cortisone, Celestone soluspan [betamethasone], Neurontin [gabapentin], and Other  Review of Systems   Review of Systems  Constitutional:  Negative for appetite change, chills, fatigue and fever.  HENT:  Negative for congestion, ear discharge, ear pain, sinus pressure and sore throat.   Eyes:  Negative for pain, discharge and visual disturbance.  Respiratory:  Negative for cough and shortness of breath.   Cardiovascular:  Negative for chest pain and palpitations.  Gastrointestinal:  Negative for abdominal pain, diarrhea and vomiting.  Genitourinary:  Positive for flank pain. Negative for dysuria, frequency and hematuria.  Musculoskeletal:  Negative for arthralgias and back pain.  Skin:  Negative for color change and rash.  Neurological:  Negative for seizures, syncope and headaches.  Psychiatric/Behavioral:  Negative for hallucinations.   All other systems reviewed and are negative.  Physical Exam Updated Vital Signs BP 114/66   Pulse 98   Temp 97.6 F (36.4 C) (Oral)   Resp 20   Ht 5\' 10"  (1.778 m)   Wt 96.6 kg   SpO2 96%   BMI 30.56 kg/m   Physical Exam Vitals and nursing note reviewed.  Constitutional:      Appearance: He  is well-developed.  HENT:     Head: Normocephalic.     Nose: Nose normal.  Eyes:     General: No scleral icterus.    Conjunctiva/sclera: Conjunctivae normal.  Neck:     Thyroid: No thyromegaly.  Cardiovascular:     Rate and Rhythm: Normal rate and regular rhythm.     Heart sounds: No murmur heard.   No friction rub. No gallop.  Pulmonary:     Breath sounds: No stridor. No wheezing or rales.  Chest:     Chest wall: No tenderness.  Abdominal:     General: There is no distension.     Tenderness: There is no abdominal tenderness. There is no rebound.  Musculoskeletal:  General: Normal range of motion.     Cervical back: Neck supple.  Lymphadenopathy:     Cervical: No cervical adenopathy.  Skin:    Findings: No erythema or rash.  Neurological:     Mental Status: He is alert and oriented to person, place, and time.     Motor: No abnormal muscle tone.     Coordination: Coordination normal.  Psychiatric:        Behavior: Behavior normal.    ED Results / Procedures / Treatments   Labs (all labs ordered are listed, but only abnormal results are displayed) Labs Reviewed  URINALYSIS, ROUTINE W REFLEX MICROSCOPIC - Abnormal; Notable for the following components:      Result Value   Color, Urine AMBER (*)    APPearance TURBID (*)    Specific Gravity, Urine >1.030 (*)    Hgb urine dipstick LARGE (*)    Bilirubin Urine SMALL (*)    Ketones, ur 15 (*)    Protein, ur 100 (*)    Leukocytes,Ua SMALL (*)    All other components within normal limits  CBC WITH DIFFERENTIAL/PLATELET - Abnormal; Notable for the following components:   WBC 19.8 (*)    Platelets 144 (*)    Neutro Abs 18.0 (*)    Lymphs Abs 0.5 (*)    Abs Immature Granulocytes 0.28 (*)    All other components within normal limits  COMPREHENSIVE METABOLIC PANEL - Abnormal; Notable for the following components:   Sodium 129 (*)    CO2 19 (*)    Glucose, Bld 152 (*)    BUN 44 (*)    Creatinine, Ser 2.16 (*)     Calcium 7.9 (*)    Total Bilirubin 3.8 (*)    GFR, Estimated 33 (*)    All other components within normal limits  URINALYSIS, MICROSCOPIC (REFLEX) - Abnormal; Notable for the following components:   Bacteria, UA MANY (*)    All other components within normal limits  URINE CULTURE    EKG None  Radiology CT Renal Stone Study  Result Date: 01/31/2021 CLINICAL DATA:  Flank pain, kidney stone suspected EXAM: CT ABDOMEN AND PELVIS WITHOUT CONTRAST TECHNIQUE: Multidetector CT imaging of the abdomen and pelvis was performed following the standard protocol without IV contrast. COMPARISON:  03/29/2020 FINDINGS: Lower chest: No acute abnormality. Hepatobiliary: Mildly decreased attenuation of the hepatic parenchyma compatible with hepatic steatosis. No focal liver lesion identified on unenhanced imaging. Unremarkable gallbladder. No hyperdense gallstone. No biliary dilatation. Pancreas: Unremarkable. No pancreatic ductal dilatation or surrounding inflammatory changes. Spleen: Normal in size without focal abnormality. Adrenals/Urinary Tract: Unremarkable adrenal glands. Bilateral kidneys have an unremarkable unenhanced appearance. No renal stone or hydronephrosis. Bilateral ureters are nondilated. No ureteral calculi are seen. Urinary bladder is decompressed, limiting its evaluation. There appears to be bladder wall thickening and there is slight perivesicular fat stranding, which may represent cystitis. Stomach/Bowel: Stomach within normal limits. 12 mm fat density lipoma is noted within the second portion of the duodenum (series 2, image 32). No dilated loops of bowel. Numerous colonic diverticula. No focal bowel wall thickening or inflammatory changes. Vascular/Lymphatic: Aortic atherosclerosis. No enlarged abdominal or pelvic lymph nodes. Reproductive: Mild prostatomegaly. There are scattered dystrophic calcifications within the prostate. Faint tiny calcification within the prostate gland at the midline  (series 2, image 88) which could potentially represent a calculus within the prostatic portion of the urethra given the patient's symptoms. Other: No free fluid. No abdominopelvic fluid collection. No pneumoperitoneum. No abdominal wall  hernia. Musculoskeletal: No acute or significant osseous findings. IMPRESSION: 1. Faint tiny calcification within the prostate gland at the midline which could potentially represent a calculus within the prostatic portion of the urethra given the patient's symptoms. 2. Urinary bladder is decompressed, limiting its evaluation. There appears to be bladder wall thickening and slight perivesicular fat stranding, which may represent cystitis. Correlation with urinalysis is recommended. 3. No renal or ureteral calculi.  No hydronephrosis. 4. Colonic diverticulosis without evidence of acute diverticulitis. 5. Hepatic steatosis. Aortic Atherosclerosis (ICD10-I70.0). Electronically Signed   By: Davina Poke D.O.   On: 01/31/2021 10:36    Procedures Procedures   Medications Ordered in ED Medications  ketorolac (TORADOL) 30 MG/ML injection 15 mg (15 mg Intravenous Given 01/31/21 0916)  ondansetron (ZOFRAN) injection 4 mg (4 mg Intravenous Given 01/31/21 0916)  sodium chloride 0.9 % bolus 500 mL (500 mLs Intravenous New Bag/Given 01/31/21 1244)  cefTRIAXone (ROCEPHIN) 2 g in sodium chloride 0.9 % 100 mL IVPB (2 g Intravenous New Bag/Given 01/31/21 1244)    ED Course  I have reviewed the triage vital signs and the nursing notes.  Pertinent labs & imaging results that were available during my care of the patient were reviewed by me and considered in my medical decision making (see chart for details). Patient with urinary tract infection and mild AKI and possible prostatitis.  I spoke with urology Dr. Alyson Ingles and he felt like the patient could go home with Rocephin here and Bactrim for 28 days.  He will follow-up on Monday with urology   MDM Rules/Calculators/A&P                            Urinary tract infection and possible prostatitis Final Clinical Impression(s) / ED Diagnoses Final diagnoses:  Lower urinary tract infectious disease    Rx / DC Orders ED Discharge Orders          Ordered    sulfamethoxazole-trimethoprim (BACTRIM DS) 800-160 MG tablet        01/31/21 1352             Milton Ferguson, MD 02/02/21 1025

## 2021-01-31 NOTE — Discharge Instructions (Addendum)
Follow-up with urology in [place] on Monday.  Call today to get an appointment.  Return if any problems

## 2021-02-03 LAB — URINE CULTURE: Culture: 60000 — AB

## 2021-02-08 ENCOUNTER — Encounter: Payer: Self-pay | Admitting: Urology

## 2021-02-08 ENCOUNTER — Ambulatory Visit (INDEPENDENT_AMBULATORY_CARE_PROVIDER_SITE_OTHER): Payer: Medicare Other | Admitting: Urology

## 2021-02-08 ENCOUNTER — Other Ambulatory Visit: Payer: Self-pay

## 2021-02-08 VITALS — BP 124/78 | HR 116 | Temp 97.6°F | Wt 211.0 lb

## 2021-02-08 DIAGNOSIS — N201 Calculus of ureter: Secondary | ICD-10-CM

## 2021-02-08 DIAGNOSIS — N138 Other obstructive and reflux uropathy: Secondary | ICD-10-CM | POA: Diagnosis not present

## 2021-02-08 DIAGNOSIS — R3912 Poor urinary stream: Secondary | ICD-10-CM

## 2021-02-08 DIAGNOSIS — N401 Enlarged prostate with lower urinary tract symptoms: Secondary | ICD-10-CM

## 2021-02-08 LAB — MICROSCOPIC EXAMINATION
Bacteria, UA: NONE SEEN
Epithelial Cells (non renal): NONE SEEN /hpf (ref 0–10)
Renal Epithel, UA: NONE SEEN /hpf
WBC, UA: NONE SEEN /hpf (ref 0–5)

## 2021-02-08 LAB — URINALYSIS, ROUTINE W REFLEX MICROSCOPIC
Bilirubin, UA: NEGATIVE
Glucose, UA: NEGATIVE
Ketones, UA: NEGATIVE
Leukocytes,UA: NEGATIVE
Nitrite, UA: NEGATIVE
Protein,UA: NEGATIVE
Specific Gravity, UA: <1.005 — ABNORMAL LOW (ref 1.005–1.030)
Urobilinogen, Ur: 0.2 mg/dL (ref 0.2–1.0)
pH, UA: 6.5 (ref 5.0–7.5)

## 2021-02-08 MED ORDER — CIPROFLOXACIN HCL 500 MG PO TABS
500.0000 mg | ORAL_TABLET | Freq: Once | ORAL | Status: AC
  Administered 2021-02-08: 500 mg via ORAL

## 2021-02-08 NOTE — H&P (View-Only) (Signed)
02/08/2021 10:43 AM   Adrian Marshall 02-Aug-1955 [id number]  Referring provider: Clinic, Adrian [facility] [address] [place],  [place] [address]  Weaker urinary stream   HPI: Adrian Marshall is a 65yo here for evaluation of a weak urinary stream. He developed severe abdominal pain and presented to the ER on 10/20 and was diagnosed with a prostatic urethral calculus. He has a hx of TURP in 08/2017. PSA 1.8 last year. IPSS 23 QOL 3. Nocturia 3-4x, his urine stream is weak. He is unhappy with his urination   PMH: Past Medical History:  Diagnosis Date   Arthritis    NECK   BPH (benign prostatic hyperplasia)    Cancer (HCC)    SKIN CANCER-BASAL   Coronary artery disease    History of kidney stones    Hypercholesterolemia    Hypothyroidism    Renal disorder    kidney stones   Sleep apnea    USES CPAP   Thyroid disease     Surgical History: Past Surgical History:  Procedure Laterality Date   ANKLE SURGERY     CYSTOSCOPY WITH RETROGRADE PYELOGRAM, URETEROSCOPY AND STENT PLACEMENT Left 08/16/2017   Procedure: CYSTOSCOPY WITH RETROGRADE PYELOGRAM, URETEROSCOPY AND STENT PLACEMENT;  Surgeon: Adrian Espy, MD;  Location: [facility];  Service: Urology;  Laterality: Left;   HERNIA REPAIR     KNEE SURGERY     NECK SURGERY     SHOULDER SURGERY     TRANSURETHRAL RESECTION OF PROSTATE N/A 07/20/2017   Procedure: TRANSURETHRAL RESECTION OF THE PROSTATE (TURP);  Surgeon: Adrian Sons, MD;  Location: [facility];  Service: Urology;  Laterality: N/A;   URETEROSCOPY WITH HOLMIUM LASER LITHOTRIPSY Left 08/16/2017   Procedure: URETEROSCOPY WITH HOLMIUM LASER LITHOTRIPSY;  Surgeon: Adrian Espy, MD;  Location: [facility];  Service: Urology;  Laterality: Left;   VASECTOMY      Home Medications:  Allergies as of 02/08/2021       Reactions   Cortisone    inj to shoulder - paralysis   Celestone Soluspan [betamethasone]    Other reaction(s): Numbness   Neurontin  [gabapentin]    Altered mental state   Other    Steroids - paralysis         Medication List        Accurate as of February 08, 2021 10:43 AM. If you have any questions, ask your nurse or doctor.          STOP taking these medications    acetaminophen 650 MG CR tablet Commonly known as: Tylenol 8 Hour Stopped by: Nicolette Bang, MD   cyclobenzaprine 10 MG tablet Commonly known as: FLEXERIL Stopped by: Nicolette Bang, MD   doxycycline 100 MG capsule Commonly known as: VIBRAMYCIN Stopped by: Nicolette Bang, MD   HYDROcodone-acetaminophen 5-325 MG tablet Commonly known as: NORCO/VICODIN Stopped by: Nicolette Bang, MD   ondansetron 4 MG disintegrating tablet Commonly known as: Zofran ODT Stopped by: Nicolette Bang, MD       TAKE these medications    amitriptyline 50 MG tablet Commonly known as: ELAVIL Take 1 tablet by mouth at bedtime.   aspirin EC 81 MG tablet Take 81 mg by mouth daily.   atorvastatin 80 MG tablet Commonly known as: LIPITOR Take 80 mg by mouth every evening.   levothyroxine 88 MCG tablet Commonly known as: SYNTHROID Take 176 mcg by mouth daily before breakfast.   sulfamethoxazole-trimethoprim 800-160 MG tablet Commonly known as: BACTRIM DS Take one po bid for 28  days   tamsulosin 0.4 MG Caps capsule Commonly known as: FLOMAX Take 0.4 mg by mouth 2 (two) times daily.   Urocit-K 10 10 MEQ (1080 MG) SR tablet Generic drug: potassium citrate Take 10 mEq by mouth 2 (two) times daily with a meal.        Allergies:  Allergies  Allergen Reactions   Cortisone     inj to shoulder - paralysis   Celestone Soluspan [Betamethasone]     Other reaction(s): Numbness   Neurontin [Gabapentin]     Altered mental state   Other     Steroids - paralysis     Family History: Family History  Problem Relation Age of Onset   Prostate cancer Neg Hx    Bladder Cancer Neg Hx    Kidney cancer Neg Hx     Social History:  reports  that he has never smoked. He has never used smokeless tobacco. He reports current alcohol use. He reports that he does not use drugs.  ROS: All other review of systems were reviewed and are negative except what is noted above in HPI  Physical Exam: BP 124/78   Pulse (!) 116   Temp 97.6 F (36.4 C)   Wt 211 lb (95.7 kg)   BMI 30.28 kg/m   Constitutional:  Alert and oriented, No acute distress. HEENT: [place] AT, moist mucus membranes.  Trachea midline, no masses. Cardiovascular: No clubbing, cyanosis, or edema. Respiratory: Normal respiratory effort, no increased work of breathing. GI: Abdomen is soft, nontender, nondistended, no abdominal masses GU: No CVA tenderness.  Lymph: No cervical or inguinal lymphadenopathy. Skin: No rashes, bruises or suspicious lesions. Neurologic: Grossly intact, no focal deficits, moving all 4 extremities. Psychiatric: Normal mood and affect.  Laboratory Data: Lab Results  Component Value Date   WBC 19.8 (H) 01/31/2021   HGB 15.0 01/31/2021   HCT 44.3 01/31/2021   MCV 90.0 01/31/2021   PLT 144 (L) 01/31/2021    Lab Results  Component Value Date   CREATININE 2.16 (H) 01/31/2021    Lab Results  Component Value Date   PSA 1.21 Test Methodology: [employer] PSA 08/16/2008    No results found for: TESTOSTERONE  No results found for: HGBA1C  Urinalysis    Component Value Date/Time   COLORURINE AMBER (A) 01/31/2021 0923   APPEARANCEUR TURBID (A) 01/31/2021 0923   APPEARANCEUR Clear 11/30/2018 1337   LABSPEC >1.030 (H) 01/31/2021 0923   PHURINE 5.5 01/31/2021 0923   GLUCOSEU NEGATIVE 01/31/2021 0923   HGBUR LARGE (A) 01/31/2021 0923   BILIRUBINUR SMALL (A) 01/31/2021 0923   BILIRUBINUR negative 09/17/2019 0832   BILIRUBINUR Negative 11/30/2018 1337   KETONESUR 15 (A) 01/31/2021 0923   PROTEINUR 100 (A) 01/31/2021 0923   UROBILINOGEN 0.2 09/17/2019 0832   UROBILINOGEN 0.2 08/16/2008 1556   NITRITE NEGATIVE 01/31/2021 0923   LEUKOCYTESUR  SMALL (A) 01/31/2021 0923    Lab Results  Component Value Date   LABMICR See below: 11/30/2018   WBCUA 0-5 11/30/2018   RBCUA 3-10 (A) 03/17/2018   LABEPIT 0-10 11/30/2018   MUCUS Present (A) 11/30/2018   BACTERIA MANY (A) 01/31/2021    Pertinent Imaging:  Results for orders placed during the hospital encounter of 09/17/19  DG Abdomen 1 View  Narrative CLINICAL DATA:  Possible kidney stones. LOWER abdominal pain and perineum/penile pain, burning, and pain with urination for 3 days. History of kidney stones and prostate surgery.  EXAM: ABDOMEN - 1 VIEW  COMPARISON:  11/20/2018  FINDINGS: Punctate  calcification overlies the RIGHT renal shadow, measuring approximately 3 millimeters. No calcifications identified in the expected locations of the ureters. A 7 millimeter calcification is seen at the midline just inferior to the symphysis pubis, possibly representing urethral stone or soft tissue calcification.  Visualized bowel gas pattern is nonobstructive. Moderate stool burden. Visualized osseous structures have a normal appearance.  IMPRESSION: 1. RIGHT nephrolithiasis. 2. Possible urethral stone or soft tissue calcification. Consider CT of the abdomen and pelvis stone protocol for further characterization. 3. Nonobstructive bowel gas pattern. Moderate stool burden.  These results will be called to the ordering clinician or representative by the Radiologist Assistant, and communication documented in the PACS or [employer].   Electronically Signed By: Nolon Nations M.D. On: 09/17/2019 09:19  No results found for this or any previous visit.  No results found for this or any previous visit.  No results found for this or any previous visit.  Results for orders placed during the hospital encounter of 08/21/17  US RENAL  Narrative CLINICAL DATA:  Left ureteral stone.  EXAM: RENAL / URINARY TRACT ULTRASOUND COMPLETE  COMPARISON:   None.  FINDINGS: Right Kidney:  Length: 10.4 cm. Echogenicity within normal limits. No mass or hydronephrosis visualized. 3 mm calculus within the lower pole of the right kidney, nonobstructive.  Left Kidney:  Length: 11.7 cm. Echogenicity within normal limits. No mass or hydronephrosis visualized.  Bladder:  Appears normal for degree of bladder distention. Bilateral ureteral jets seen.  IMPRESSION: 3 mm nonobstructive right lower pole renal calculus, otherwise unremarkable.   Electronically Signed By: Fidela Salisbury M.D. On: 08/21/2017 23:18  No results found for this or any previous visit.  No results found for this or any previous visit.  Results for orders placed during the hospital encounter of 01/31/21  CT Renal Stone Study  Narrative CLINICAL DATA:  Flank pain, kidney stone suspected  EXAM: CT ABDOMEN AND PELVIS WITHOUT CONTRAST  TECHNIQUE: Multidetector CT imaging of the abdomen and pelvis was performed following the standard protocol without IV contrast.  COMPARISON:  03/29/2020  FINDINGS: Lower chest: No acute abnormality.  Hepatobiliary: Mildly decreased attenuation of the hepatic parenchyma compatible with hepatic steatosis. No focal liver lesion identified on unenhanced imaging. Unremarkable gallbladder. No hyperdense gallstone. No biliary dilatation.  Pancreas: Unremarkable. No pancreatic ductal dilatation or surrounding inflammatory changes.  Spleen: Normal in size without focal abnormality.  Adrenals/Urinary Tract: Unremarkable adrenal glands. Bilateral kidneys have an unremarkable unenhanced appearance. No renal stone or hydronephrosis. Bilateral ureters are nondilated. No ureteral calculi are seen. Urinary bladder is decompressed, limiting its evaluation. There appears to be bladder wall thickening and there is slight perivesicular fat stranding, which may represent cystitis.  Stomach/Bowel: Stomach within normal limits. 12 mm  fat density lipoma is noted within the second portion of the duodenum (series 2, image 32). No dilated loops of bowel. Numerous colonic diverticula. No focal bowel wall thickening or inflammatory changes.  Vascular/Lymphatic: Aortic atherosclerosis. No enlarged abdominal or pelvic lymph nodes.  Reproductive: Mild prostatomegaly. There are scattered dystrophic calcifications within the prostate. Faint tiny calcification within the prostate gland at the midline (series 2, image 88) which could potentially represent a calculus within the prostatic portion of the urethra given the patient's symptoms.  Other: No free fluid. No abdominopelvic fluid collection. No pneumoperitoneum. No abdominal wall hernia.  Musculoskeletal: No acute or significant osseous findings.  IMPRESSION: 1. Faint tiny calcification within the prostate gland at the midline which could potentially represent a calculus within  the prostatic portion of the urethra given the patient's symptoms. 2. Urinary bladder is decompressed, limiting its evaluation. There appears to be bladder wall thickening and slight perivesicular fat stranding, which may represent cystitis. Correlation with urinalysis is recommended. 3. No renal or ureteral calculi.  No hydronephrosis. 4. Colonic diverticulosis without evidence of acute diverticulitis. 5. Hepatic steatosis.  Aortic Atherosclerosis (ICD10-I70.0).   Electronically Signed By: Davina Poke D.O. On: 01/31/2021 10:36    Cystoscopy Procedure Note  Patient identification was confirmed, informed consent was obtained, and patient was prepped using Betadine solution.  Lidocaine jelly was administered per urethral meatus.     Pre-Procedure: - Inspection reveals a normal caliber ureteral meatus.  Procedure: The flexible cystoscope was introduced without difficulty - No urethral strictures/lesions are present. - Enlarged prostate nodular prostate with multiple areas of  regrowth obstructing the urethra - Normal bladder neck - Bilateral ureteral orifices identified - Bladder mucosa  reveals no ulcers, tumors, or lesions - No bladder stones - No trabeculation    Post-Procedure: - Patient tolerated the procedure well   Nicolette Bang, MD    Assessment & Plan:    1.  Benign prostatic hyperplasia with urinary obstruction We discussed the management of his BPH including continued medical therapy, Rezum, Urolift, TURP and simple prostatectomy. After discussing the options the patient has elected to proceed with TURP. Risks/benefits/alternatives discussed.   2. Weak urinary stream Continue flomax BID   No follow-ups on file.  Nicolette Bang, MD  [facility] [place]

## 2021-02-08 NOTE — Progress Notes (Signed)
Urological Symptom Review  Patient is experiencing the following symptoms: Frequent urination Get up at night to urinate Urinary tract infection Injury to kidneys/bladder Weak stream   Review of Systems  Gastrointestinal (upper)  : Negative for upper GI symptoms  Gastrointestinal (lower) : Negative for lower GI symptoms  Constitutional : Negative for symptoms  Skin: Negative for skin symptoms  Eyes: Negative for eye symptoms  Ear/Nose/Throat : Negative for Ear/Nose/Throat symptoms  Hematologic/Lymphatic: Negative for Hematologic/Lymphatic symptoms  Cardiovascular : Negative for cardiovascular symptoms  Respiratory : Negative for respiratory symptoms  Endocrine: Negative for endocrine symptoms  Musculoskeletal: Back pain  Neurological: Negative for neurological symptoms  Psychologic: Negative for psychiatric symptoms

## 2021-02-08 NOTE — Progress Notes (Signed)
02/08/2021 10:43 AM   Adrian Marshall Adrian Marshall [id number]  Referring provider: Clinic, Adrian Marshall [address] Adrian Marshall,  Adrian Marshall [id number]  Weaker urinary stream   HPI: Adrian Marshall is a 65yo here for evaluation of a weak urinary stream. He developed severe abdominal pain and presented to the ER on 10/20 and was diagnosed with a prostatic urethral calculus. He has a hx of TURP in 08/2017. PSA 1.8 last year. IPSS 23 QOL 3. Nocturia 3-4x, his urine stream is weak. He is unhappy with his urination   PMH: Past Medical History:  Diagnosis Date   Arthritis    NECK   BPH (benign prostatic hyperplasia)    Cancer (HCC)    SKIN CANCER-BASAL   Coronary artery disease    History of kidney stones    Hypercholesterolemia    Hypothyroidism    Renal disorder    kidney stones   Sleep apnea    USES CPAP   Thyroid disease     Surgical History: Past Surgical History:  Procedure Laterality Date   ANKLE SURGERY     CYSTOSCOPY WITH RETROGRADE PYELOGRAM, URETEROSCOPY AND STENT PLACEMENT Left 08/16/2017   Procedure: CYSTOSCOPY WITH RETROGRADE PYELOGRAM, URETEROSCOPY AND STENT PLACEMENT;  Surgeon: Hollice Espy, Marshall;  Location: [facility];  Service: Urology;  Laterality: Left;   HERNIA REPAIR     KNEE SURGERY     NECK SURGERY     SHOULDER SURGERY     TRANSURETHRAL RESECTION OF PROSTATE N/A 07/20/2017   Procedure: TRANSURETHRAL RESECTION OF THE PROSTATE (TURP);  Surgeon: Abbie Sons, Marshall;  Location: [facility];  Service: Urology;  Laterality: N/A;   URETEROSCOPY WITH HOLMIUM LASER LITHOTRIPSY Left 08/16/2017   Procedure: URETEROSCOPY WITH HOLMIUM LASER LITHOTRIPSY;  Surgeon: Hollice Espy, Marshall;  Location: [facility];  Service: Urology;  Laterality: Left;   VASECTOMY      Home Medications:  Allergies as of 02/08/2021       Reactions   Cortisone    inj to shoulder - paralysis   Celestone Soluspan [betamethasone]    Other reaction(s): Numbness   Neurontin  [gabapentin]    Altered mental state   Other    Steroids - paralysis         Medication List        Accurate as of February 08, 2021 10:43 AM. If you have any questions, ask your nurse or doctor.          STOP taking these medications    acetaminophen 650 MG CR tablet Commonly known as: Tylenol 8 Hour Stopped by: Adrian Marshall   cyclobenzaprine 10 MG tablet Commonly known as: FLEXERIL Stopped by: Adrian Marshall   doxycycline 100 MG capsule Commonly known as: VIBRAMYCIN Stopped by: Adrian Marshall   HYDROcodone-acetaminophen 5-325 MG tablet Commonly known as: NORCO/VICODIN Stopped by: Adrian Marshall   ondansetron 4 MG disintegrating tablet Commonly known as: Zofran ODT Stopped by: Adrian Marshall       TAKE these medications    amitriptyline 50 MG tablet Commonly known as: ELAVIL Take 1 tablet by mouth at bedtime.   aspirin EC 81 MG tablet Take 81 mg by mouth daily.   atorvastatin 80 MG tablet Commonly known as: LIPITOR Take 80 mg by mouth every evening.   levothyroxine 88 MCG tablet Commonly known as: SYNTHROID Take 176 mcg by mouth daily before breakfast.   sulfamethoxazole-trimethoprim 800-160 MG tablet Commonly known as: BACTRIM DS Take one po bid for 28  days   tamsulosin 0.4 MG Caps capsule Commonly known as: FLOMAX Take 0.4 mg by mouth 2 (two) times daily.   Urocit-K 10 10 MEQ (1080 MG) SR tablet Generic drug: potassium citrate Take 10 mEq by mouth 2 (two) times daily with a meal.        Allergies:  Allergies  Allergen Reactions   Cortisone     inj to shoulder - paralysis   Celestone Soluspan [Betamethasone]     Other reaction(s): Numbness   Neurontin [Gabapentin]     Altered mental state   Other     Steroids - paralysis     Family History: Family History  Problem Relation Age of Onset   Prostate cancer Neg Hx    Bladder Cancer Neg Hx    Kidney cancer Neg Hx     Social History:  reports  that he has never smoked. He has never used smokeless tobacco. He reports current alcohol use. He reports that he does not use drugs.  ROS: All other review of systems were reviewed and are negative except what is noted above in HPI  Physical Exam: BP 124/78   Pulse (!) 116   Temp 97.6 F (36.4 C)   Wt 211 lb (95.7 kg)   BMI 30.28 kg/m   Constitutional:  Alert and oriented, No acute distress. HEENT: [place] AT, moist mucus membranes.  Trachea midline, no masses. Cardiovascular: No clubbing, cyanosis, or edema. Respiratory: Normal respiratory effort, no increased work of breathing. GI: Abdomen is soft, nontender, nondistended, no abdominal masses GU: No CVA tenderness.  Lymph: No cervical or inguinal lymphadenopathy. Skin: No rashes, bruises or suspicious lesions. Neurologic: Grossly intact, no focal deficits, moving all 4 extremities. Psychiatric: Normal mood and affect.  Laboratory Data: Lab Results  Component Value Date   WBC 19.8 (H) 01/31/2021   HGB 15.0 01/31/2021   HCT 44.3 01/31/2021   MCV 90.0 01/31/2021   PLT 144 (L) 01/31/2021    Lab Results  Component Value Date   CREATININE 2.16 (H) 01/31/2021    Lab Results  Component Value Date   PSA 1.21 Test Methodology: [employer] PSA 08/16/2008    No results found for: TESTOSTERONE  No results found for: HGBA1C  Urinalysis    Component Value Date/Time   COLORURINE AMBER (A) 01/31/2021 0923   APPEARANCEUR TURBID (A) 01/31/2021 0923   APPEARANCEUR Clear 11/30/2018 1337   LABSPEC >1.030 (H) 01/31/2021 0923   PHURINE 5.5 01/31/2021 0923   GLUCOSEU NEGATIVE 01/31/2021 0923   HGBUR LARGE (A) 01/31/2021 0923   BILIRUBINUR SMALL (A) 01/31/2021 0923   BILIRUBINUR negative 09/17/2019 0832   BILIRUBINUR Negative 11/30/2018 1337   KETONESUR 15 (A) 01/31/2021 0923   PROTEINUR 100 (A) 01/31/2021 0923   UROBILINOGEN 0.2 09/17/2019 0832   UROBILINOGEN 0.2 08/16/2008 1556   NITRITE NEGATIVE 01/31/2021 0923   LEUKOCYTESUR  SMALL (A) 01/31/2021 0923    Lab Results  Component Value Date   LABMICR See below: 11/30/2018   WBCUA 0-5 11/30/2018   RBCUA 3-10 (A) 03/17/2018   LABEPIT 0-10 11/30/2018   MUCUS Present (A) 11/30/2018   BACTERIA MANY (A) 01/31/2021    Pertinent Imaging:  Results for orders placed during the hospital encounter of 09/17/19  DG Abdomen 1 View  Narrative CLINICAL DATA:  Possible kidney stones. LOWER abdominal pain and perineum/penile pain, burning, and pain with urination for 3 days. History of kidney stones and prostate surgery.  EXAM: ABDOMEN - 1 VIEW  COMPARISON:  11/20/2018  FINDINGS: Punctate  calcification overlies the RIGHT renal shadow, measuring approximately 3 millimeters. No calcifications identified in the expected locations of the ureters. A 7 millimeter calcification is seen at the midline just inferior to the symphysis pubis, possibly representing urethral stone or soft tissue calcification.  Visualized bowel gas pattern is nonobstructive. Moderate stool burden. Visualized osseous structures have a normal appearance.  IMPRESSION: 1. RIGHT nephrolithiasis. 2. Possible urethral stone or soft tissue calcification. Consider CT of the abdomen and pelvis stone protocol for further characterization. 3. Nonobstructive bowel gas pattern. Moderate stool burden.  These results will be called to the ordering clinician or representative by the Radiologist Assistant, and communication documented in the PACS or [employer].   Electronically Signed By: Nolon Nations M.D. On: 09/17/2019 09:19  No results found for this or any previous visit.  No results found for this or any previous visit.  No results found for this or any previous visit.  Results for orders placed during the hospital encounter of 08/21/17  US RENAL  Narrative CLINICAL DATA:  Left ureteral stone.  EXAM: RENAL / URINARY TRACT ULTRASOUND COMPLETE  COMPARISON:   None.  FINDINGS: Right Kidney:  Length: 10.4 cm. Echogenicity within normal limits. No mass or hydronephrosis visualized. 3 mm calculus within the lower pole of the right kidney, nonobstructive.  Left Kidney:  Length: 11.7 cm. Echogenicity within normal limits. No mass or hydronephrosis visualized.  Bladder:  Appears normal for degree of bladder distention. Bilateral ureteral jets seen.  IMPRESSION: 3 mm nonobstructive right lower pole renal calculus, otherwise unremarkable.   Electronically Signed By: Fidela Salisbury M.D. On: 08/21/2017 23:18  No results found for this or any previous visit.  No results found for this or any previous visit.  Results for orders placed during the hospital encounter of 01/31/21  CT Renal Stone Study  Narrative CLINICAL DATA:  Flank pain, kidney stone suspected  EXAM: CT ABDOMEN AND PELVIS WITHOUT CONTRAST  TECHNIQUE: Multidetector CT imaging of the abdomen and pelvis was performed following the standard protocol without IV contrast.  COMPARISON:  03/29/2020  FINDINGS: Lower chest: No acute abnormality.  Hepatobiliary: Mildly decreased attenuation of the hepatic parenchyma compatible with hepatic steatosis. No focal liver lesion identified on unenhanced imaging. Unremarkable gallbladder. No hyperdense gallstone. No biliary dilatation.  Pancreas: Unremarkable. No pancreatic ductal dilatation or surrounding inflammatory changes.  Spleen: Normal in size without focal abnormality.  Adrenals/Urinary Tract: Unremarkable adrenal glands. Bilateral kidneys have an unremarkable unenhanced appearance. No renal stone or hydronephrosis. Bilateral ureters are nondilated. No ureteral calculi are seen. Urinary bladder is decompressed, limiting its evaluation. There appears to be bladder wall thickening and there is slight perivesicular fat stranding, which may represent cystitis.  Stomach/Bowel: Stomach within normal limits. 12 mm  fat density lipoma is noted within the second portion of the duodenum (series 2, image 32). No dilated loops of bowel. Numerous colonic diverticula. No focal bowel wall thickening or inflammatory changes.  Vascular/Lymphatic: Aortic atherosclerosis. No enlarged abdominal or pelvic lymph nodes.  Reproductive: Mild prostatomegaly. There are scattered dystrophic calcifications within the prostate. Faint tiny calcification within the prostate gland at the midline (series 2, image 88) which could potentially represent a calculus within the prostatic portion of the urethra given the patient's symptoms.  Other: No free fluid. No abdominopelvic fluid collection. No pneumoperitoneum. No abdominal wall hernia.  Musculoskeletal: No acute or significant osseous findings.  IMPRESSION: 1. Faint tiny calcification within the prostate gland at the midline which could potentially represent a calculus within  the prostatic portion of the urethra given the patient's symptoms. 2. Urinary bladder is decompressed, limiting its evaluation. There appears to be bladder wall thickening and slight perivesicular fat stranding, which may represent cystitis. Correlation with urinalysis is recommended. 3. No renal or ureteral calculi.  No hydronephrosis. 4. Colonic diverticulosis without evidence of acute diverticulitis. 5. Hepatic steatosis.  Aortic Atherosclerosis (ICD10-I70.0).   Electronically Signed By: Davina Poke D.O. On: 01/31/2021 10:36    Cystoscopy Procedure Note  Patient identification was confirmed, informed consent was obtained, and patient was prepped using Betadine solution.  Lidocaine jelly was administered per urethral meatus.     Pre-Procedure: - Inspection reveals a normal caliber ureteral meatus.  Procedure: The flexible cystoscope was introduced without difficulty - No urethral strictures/lesions are present. - Enlarged prostate nodular prostate with multiple areas of  regrowth obstructing the urethra - Normal bladder neck - Bilateral ureteral orifices identified - Bladder mucosa  reveals no ulcers, tumors, or lesions - No bladder stones - No trabeculation    Post-Procedure: - Patient tolerated the procedure well   Adrian Marshall    Assessment & Plan:    1.  Benign prostatic hyperplasia with urinary obstruction We discussed the management of his BPH including continued medical therapy, Rezum, Urolift, TURP and simple prostatectomy. After discussing the options the patient has elected to proceed with TURP. Risks/benefits/alternatives discussed.   2. Weak urinary stream Continue flomax BID   No follow-ups on file.  Adrian Marshall  [facility] [place]

## 2021-02-18 ENCOUNTER — Encounter: Payer: Self-pay | Admitting: Urology

## 2021-02-18 NOTE — Patient Instructions (Signed)

## 2021-02-27 NOTE — Patient Instructions (Signed)
Your procedure is scheduled on: 03/04/2021  Report to [facility] at 7:15    AM.  Call this number if you have problems the morning of surgery: [phone]   Remember:   Do not Eat or Drink after midnight         No Smoking the morning of surgery  :  Take these medicines the morning of surgery with A SIP OF WATER: levothyroxine, and flomax   Do not wear jewelry, make-up or nail polish.  Do not wear lotions, powders, or perfumes. You may wear deodorant.  Do not shave 48 hours prior to surgery. Men may shave face and neck.  Do not bring valuables to the hospital.  Contacts, dentures or bridgework may not be worn into surgery.  Leave suitcase in the car. After surgery it may be brought to your room.  For patients admitted to the hospital, checkout time is 11:00 AM the day of discharge.   Patients discharged the day of surgery will not be allowed to drive home.    Special Instructions: Shower using CHG night before surgery and shower the day of surgery use CHG.  Use special wash - you have one bottle of CHG for all showers.  You should use approximately 1/2 of the bottle for each shower.  How to Use Chlorhexidine for Bathing Chlorhexidine gluconate (CHG) is a germ-killing (antiseptic) solution that is used to clean the skin. It can get rid of the bacteria that normally live on the skin and can keep them away for about 24 hours. To clean your skin with CHG, you may be given: A CHG solution to use in the shower or as part of a sponge bath. A prepackaged cloth that contains CHG. Cleaning your skin with CHG may help lower the risk for infection: While you are staying in the intensive care unit of the hospital. If you have a vascular access, such as a central line, to provide short-term or long-term access to your veins. If you have a catheter to drain urine from your bladder. If you are on a ventilator. A ventilator is a machine that helps you breathe by moving air in and out  of your lungs. After surgery. What are the risks? Risks of using CHG include: A skin reaction. Hearing loss, if CHG gets in your ears and you have a perforated eardrum. Eye injury, if CHG gets in your eyes and is not rinsed out. The CHG product catching fire. Make sure that you avoid smoking and flames after applying CHG to your skin. Do not use CHG: If you have a chlorhexidine allergy or have previously reacted to chlorhexidine. On babies younger than 97 months of age. How to use CHG solution Use CHG only as told by your health care provider, and follow the instructions on the label. Use the full amount of CHG as directed. Usually, this is one bottle. During a shower Follow these steps when using CHG solution during a shower (unless your health care provider gives you different instructions): Start the shower. Use your normal soap and shampoo to wash your face and hair. Turn off the shower or move out of the shower stream. Pour the CHG onto a clean washcloth. Do not use any type of brush or rough-edged sponge. Starting at your neck, lather your body down to your toes. Make sure you follow these instructions: If you will be having surgery, pay special attention to the part of your body where you will be having  surgery. Scrub this area for at least 1 minute. Do not use CHG on your head or face. If the solution gets into your ears or eyes, rinse them well with water. Avoid your genital area. Avoid any areas of skin that have broken skin, cuts, or scrapes. Scrub your back and under your arms. Make sure to wash skin folds. Let the lather sit on your skin for 1-2 minutes or as long as told by your health care provider. Thoroughly rinse your entire body in the shower. Make sure that all body creases and crevices are rinsed well. Dry off with a clean towel. Do not put any substances on your body afterward--such as powder, lotion, or perfume--unless you are told to do so by your health care  provider. Only use lotions that are recommended by the manufacturer. Put on clean clothes or pajamas. If it is the night before your surgery, sleep in clean sheets.  During a sponge bath Follow these steps when using CHG solution during a sponge bath (unless your health care provider gives you different instructions): Use your normal soap and shampoo to wash your face and hair. Pour the CHG onto a clean washcloth. Starting at your neck, lather your body down to your toes. Make sure you follow these instructions: If you will be having surgery, pay special attention to the part of your body where you will be having surgery. Scrub this area for at least 1 minute. Do not use CHG on your head or face. If the solution gets into your ears or eyes, rinse them well with water. Avoid your genital area. Avoid any areas of skin that have broken skin, cuts, or scrapes. Scrub your back and under your arms. Make sure to wash skin folds. Let the lather sit on your skin for 1-2 minutes or as long as told by your health care provider. Using a different clean, wet washcloth, thoroughly rinse your entire body. Make sure that all body creases and crevices are rinsed well. Dry off with a clean towel. Do not put any substances on your body afterward--such as powder, lotion, or perfume--unless you are told to do so by your health care provider. Only use lotions that are recommended by the manufacturer. Put on clean clothes or pajamas. If it is the night before your surgery, sleep in clean sheets. How to use CHG prepackaged cloths Only use CHG cloths as told by your health care provider, and follow the instructions on the label. Use the CHG cloth on clean, dry skin. Do not use the CHG cloth on your head or face unless your health care provider tells you to. When washing with the CHG cloth: Avoid your genital area. Avoid any areas of skin that have broken skin, cuts, or scrapes. Before surgery Follow these steps  when using a CHG cloth to clean before surgery (unless your health care provider gives you different instructions): Using the CHG cloth, vigorously scrub the part of your body where you will be having surgery. Scrub using a back-and-forth motion for 3 minutes. The area on your body should be completely wet with CHG when you are done scrubbing. Do not rinse. Discard the cloth and let the area air-dry. Do not put any substances on the area afterward, such as powder, lotion, or perfume. Put on clean clothes or pajamas. If it is the night before your surgery, sleep in clean sheets.  For general bathing Follow these steps when using CHG cloths for general bathing (unless your health  care provider gives you different instructions). Use a separate CHG cloth for each area of your body. Make sure you wash between any folds of skin and between your fingers and toes. Wash your body in the following order, switching to a new cloth after each step: The front of your neck, shoulders, and chest. Both of your arms, under your arms, and your hands. Your stomach and groin area, avoiding the genitals. Your right leg and foot. Your left leg and foot. The back of your neck, your back, and your buttocks. Do not rinse. Discard the cloth and let the area air-dry. Do not put any substances on your body afterward--such as powder, lotion, or perfume--unless you are told to do so by your health care provider. Only use lotions that are recommended by the manufacturer. Put on clean clothes or pajamas. Contact a health care provider if: Your skin gets irritated after scrubbing. You have questions about using your solution or cloth. You swallow any chlorhexidine. Call your local poison control center (1-(870) 593-1562 in the [place] Get help right away if: Your eyes itch badly, or they become very red or swollen. Your skin itches badly and is red or swollen. Your hearing changes. You have trouble seeing. You have swelling or  tingling in your mouth or throat. You have trouble breathing. These symptoms may represent a serious problem that is an emergency. Do not wait to see if the symptoms will go away. Get medical help right away. Call your local emergency services (911 in the [place] Do not drive yourself to the hospital. Summary Chlorhexidine gluconate (CHG) is a germ-killing (antiseptic) solution that is used to clean the skin. Cleaning your skin with CHG may help to lower your risk for infection. You may be given CHG to use for bathing. It may be in a bottle or in a prepackaged cloth to use on your skin. Carefully follow your health care provider's instructions and the instructions on the product label. Do not use CHG if you have a chlorhexidine allergy. Contact your health care provider if your skin gets irritated after scrubbing. This information is not intended to replace advice given to you by your health care provider. Make sure you discuss any questions you have with your health care provider. Document Revised: 06/11/2020 Document Reviewed: 06/11/2020 Elsevier Patient Education  2022 [place].  Transurethral Resection of the Prostate, Care After This sheet gives you information about how to care for yourself after your procedure. Your health care provider may also give you more specific instructions. If you have problems or questions, contact your health care provider. What can I expect after the procedure? After the procedure, it is common to have: Mild pain in your lower abdomen. Soreness or mild discomfort in your penis from having the catheter inserted during the procedure. A feeling of urgency when you need to urinate. A small amount of blood in your urine. You may notice some small blood clots in your urine. These are normal. Follow these instructions at home: Medicines Take over-the-counter and prescription medicines only as told by your health care provider. If you were prescribed an  antibiotic medicine, take it as told by your health care provider. Do not stop taking the antibiotic even if you start to feel better. Ask your health care provider if the medicine prescribed to you: Requires you to avoid driving or using heavy machinery. Can cause constipation. You may need to take actions to prevent or treat constipation, such as: Take over-the-counter or prescription  medicines. Eat foods that are high in fiber, such as fresh fruits and vegetables, whole grains, and beans. Limit foods that are high in fat and processed sugars, such as fried or sweet foods. Do not drive for 24 hours if you were given a sedative during your procedure. Activity  Return to your normal activities as told by your health care provider. Ask your health care provider what activities are safe for you. Do not lift anything that is heavier than 10 lb (4.5 kg), or the limit that you are told, for 3 weeks after the procedure or until your health care provider says that it is safe. Avoid intense physical activity for as long as told by your health care provider. Avoid sitting for a long time without moving. Get up and move around one or more times every few hours. This helps to prevent blood clots. You may increase your physical activity gradually as you start to feel better. Lifestyle Do not drink alcohol for as long as told by your health care provider. This is especially important if you are taking prescription pain medicines. Do not engage in sexual activity until your health care provider says that you can do this. General instructions  Do not take baths, swim, or use a hot tub until your health care provider approves. Drink enough fluid to keep your urine pale yellow. Urinate as soon as you feel the need to. Do not try to hold your urine for long periods of time. If your health care provider approves, you may take a stool softener for 2-3 weeks to prevent you from straining to have a bowel  movement. Wear compression stockings as told by your health care provider. These stockings help to prevent blood clots and reduce swelling in your legs. Keep all follow-up visits as told by your health care provider. This is important. Contact a health care provider if you have: Difficulty urinating. A fever. Pain that gets worse or does not improve with medicine. Blood in your urine that does not go away after 1 week of resting and drinking more fluids. Swelling in your penis or testicles. Get help right away if: You are unable to urinate. You are having more blood clots in your urine instead of fewer. You have: Large blood clots. A lot of blood in your urine. Pain in your back or lower abdomen. Pain or swelling in your legs. Chills and you are shaking. Difficulty breathing or shortness of breath. Summary After the procedure, it is common to have a small amount of blood in your urine. Avoid heavy lifting and intense physical activity for as long as told by your health care provider. Urinate as soon as you feel the need to. Do not try to hold your urine for long periods of time. Keep all follow-up visits as told by your health care provider. This is important. This information is not intended to replace advice given to you by your health care provider. Make sure you discuss any questions you have with your health care provider. Document Revised: 07/11/2020 Document Reviewed: 12/30/2017 Elsevier Patient Education  2022 [place] Anesthesia, Adult, Care After This sheet gives you information about how to care for yourself after your procedure. Your health care provider may also give you more specific instructions. If you have problems or questions, contact your health care provider. What can I expect after the procedure? After the procedure, the following side effects are common: Pain or discomfort at the IV site. Nausea. Vomiting. Sore  throat. Trouble  concentrating. Feeling cold or chills. Feeling weak or tired. Sleepiness and fatigue. Soreness and body aches. These side effects can affect parts of the body that were not involved in surgery. Follow these instructions at home: For the time period you were told by your health care provider:  Rest. Do not participate in activities where you could fall or become injured. Do not drive or use machinery. Do not drink alcohol. Do not take sleeping pills or medicines that cause drowsiness. Do not make important decisions or sign legal documents. Do not take care of children on your own. Eating and drinking Follow any instructions from your health care provider about eating or drinking restrictions. When you feel hungry, start by eating small amounts of foods that are soft and easy to digest (bland), such as toast. Gradually return to your regular diet. Drink enough fluid to keep your urine pale yellow. If you vomit, rehydrate by drinking water, juice, or clear broth. General instructions If you have sleep apnea, surgery and certain medicines can increase your risk for breathing problems. Follow instructions from your health care provider about wearing your sleep device: Anytime you are sleeping, including during daytime naps. While taking prescription pain medicines, sleeping medicines, or medicines that make you drowsy. Have a responsible adult stay with you for the time you are told. It is important to have someone help care for you until you are awake and alert. Return to your normal activities as told by your health care provider. Ask your health care provider what activities are safe for you. Take over-the-counter and prescription medicines only as told by your health care provider. If you smoke, do not smoke without supervision. Keep all follow-up visits as told by your health care provider. This is important. Contact a health care provider if: You have nausea or vomiting that does not  get better with medicine. You cannot eat or drink without vomiting. You have pain that does not get better with medicine. You are unable to pass urine. You develop a skin rash. You have a fever. You have redness around your IV site that gets worse. Get help right away if: You have difficulty breathing. You have chest pain. You have blood in your urine or stool, or you vomit blood. Summary After the procedure, it is common to have a sore throat or nausea. It is also common to feel tired. Have a responsible adult stay with you for the time you are told. It is important to have someone help care for you until you are awake and alert. When you feel hungry, start by eating small amounts of foods that are soft and easy to digest (bland), such as toast. Gradually return to your regular diet. Drink enough fluid to keep your urine pale yellow. Return to your normal activities as told by your health care provider. Ask your health care provider what activities are safe for you. This information is not intended to replace advice given to you by your health care provider. Make sure you discuss any questions you have with your health care provider. Document Revised: 12/15/2019 Document Reviewed: 07/14/2019 Elsevier Patient Education  2022 [employer].

## 2021-02-28 ENCOUNTER — Other Ambulatory Visit: Payer: Self-pay

## 2021-02-28 ENCOUNTER — Encounter (HOSPITAL_COMMUNITY)
Admission: RE | Admit: 2021-02-28 | Discharge: 2021-02-28 | Disposition: A | Discharge status: Home / Self Care | Payer: Medicare Other | Source: Ambulatory Visit | Attending: Urology | Admitting: Urology

## 2021-02-28 ENCOUNTER — Encounter (HOSPITAL_COMMUNITY): Payer: Self-pay

## 2021-02-28 VITALS — BP 136/80 | HR 109 | Temp 97.8°F | Resp 18 | Ht 70.0 in | Wt 207.0 lb

## 2021-02-28 DIAGNOSIS — Z01818 Encounter for other preprocedural examination: Secondary | ICD-10-CM | POA: Diagnosis present

## 2021-02-28 DIAGNOSIS — N400 Enlarged prostate without lower urinary tract symptoms: Secondary | ICD-10-CM | POA: Insufficient documentation

## 2021-02-28 DIAGNOSIS — D72829 Elevated white blood cell count, unspecified: Secondary | ICD-10-CM | POA: Insufficient documentation

## 2021-02-28 LAB — CBC WITH DIFFERENTIAL/PLATELET
Abs Immature Granulocytes: 0.03 10*3/uL (ref 0.00–0.07)
Basophils Absolute: 0.1 10*3/uL (ref 0.0–0.1)
Basophils Relative: 1 %
Eosinophils Absolute: 0.4 10*3/uL (ref 0.0–0.5)
Eosinophils Relative: 6 %
HCT: 45.7 % (ref 39.0–52.0)
Hemoglobin: 15.1 g/dL (ref 13.0–17.0)
Immature Granulocytes: 1 %
Lymphocytes Relative: 21 %
Lymphs Abs: 1.4 10*3/uL (ref 0.7–4.0)
MCH: 30.9 pg (ref 26.0–34.0)
MCHC: 33 g/dL (ref 30.0–36.0)
MCV: 93.5 fL (ref 80.0–100.0)
Monocytes Absolute: 0.5 10*3/uL (ref 0.1–1.0)
Monocytes Relative: 8 %
Neutro Abs: 4.1 10*3/uL (ref 1.7–7.7)
Neutrophils Relative %: 63 %
Platelets: 227 10*3/uL (ref 150–400)
RBC: 4.89 MIL/uL (ref 4.22–5.81)
RDW: 13.7 % (ref 11.5–15.5)
WBC: 6.4 10*3/uL (ref 4.0–10.5)
nRBC: 0 % (ref 0.0–0.2)

## 2021-02-28 LAB — BASIC METABOLIC PANEL
Anion gap: 9 (ref 5–15)
BUN: 14 mg/dL (ref 8–23)
CO2: 24 mmol/L (ref 22–32)
Calcium: 9.1 mg/dL (ref 8.9–10.3)
Chloride: 104 mmol/L (ref 98–111)
Creatinine, Ser: 1.21 mg/dL (ref 0.61–1.24)
GFR, Estimated: >60 mL/min (ref >60)
Glucose, Bld: 158 mg/dL — ABNORMAL HIGH (ref 70–99)
Potassium: 3.7 mmol/L (ref 3.5–5.1)
Sodium: 137 mmol/L (ref 135–145)

## 2021-03-01 NOTE — Progress Notes (Signed)
Pt called to inform him that his surgery had been moved up and needs to be here at 0630. Messages were left on both home and cell phone of the correct time and asked to call to let me know that he got the message.

## 2021-03-04 ENCOUNTER — Ambulatory Visit (HOSPITAL_COMMUNITY): Payer: Medicare Other | Admitting: Anesthesiology

## 2021-03-04 ENCOUNTER — Encounter (HOSPITAL_COMMUNITY)
Admission: RE | Disposition: A | Discharge status: Home / Self Care | Payer: Self-pay | Source: Home / Self Care | Attending: Urology

## 2021-03-04 ENCOUNTER — Encounter (HOSPITAL_COMMUNITY): Payer: Self-pay | Admitting: Urology

## 2021-03-04 ENCOUNTER — Ambulatory Visit (HOSPITAL_COMMUNITY)
Admission: RE | Admit: 2021-03-04 | Discharge: 2021-03-04 | Disposition: A | Discharge status: Home / Self Care | Payer: Medicare Other | Attending: Urology | Admitting: Urology

## 2021-03-04 DIAGNOSIS — R3914 Feeling of incomplete bladder emptying: Secondary | ICD-10-CM | POA: Diagnosis not present

## 2021-03-04 DIAGNOSIS — R3912 Poor urinary stream: Secondary | ICD-10-CM | POA: Diagnosis not present

## 2021-03-04 DIAGNOSIS — E039 Hypothyroidism, unspecified: Secondary | ICD-10-CM | POA: Diagnosis not present

## 2021-03-04 DIAGNOSIS — N138 Other obstructive and reflux uropathy: Secondary | ICD-10-CM | POA: Insufficient documentation

## 2021-03-04 DIAGNOSIS — I2510 Atherosclerotic heart disease of native coronary artery without angina pectoris: Secondary | ICD-10-CM | POA: Diagnosis not present

## 2021-03-04 DIAGNOSIS — N411 Chronic prostatitis: Secondary | ICD-10-CM | POA: Diagnosis not present

## 2021-03-04 DIAGNOSIS — G4730 Sleep apnea, unspecified: Secondary | ICD-10-CM | POA: Diagnosis not present

## 2021-03-04 DIAGNOSIS — N401 Enlarged prostate with lower urinary tract symptoms: Secondary | ICD-10-CM | POA: Diagnosis present

## 2021-03-04 DIAGNOSIS — N410 Acute prostatitis: Secondary | ICD-10-CM | POA: Diagnosis not present

## 2021-03-04 HISTORY — PX: TRANSURETHRAL RESECTION OF PROSTATE: SHX73

## 2021-03-04 HISTORY — PX: CYSTOSCOPY: SHX5120

## 2021-03-04 SURGERY — CYSTOSCOPY
Anesthesia: General | Site: Ureter

## 2021-03-04 MED ORDER — OXYCODONE-ACETAMINOPHEN 5-325 MG PO TABS
1.0000 | ORAL_TABLET | Freq: Once | ORAL | Status: AC
  Administered 2021-03-04: 1 via ORAL
  Filled 2021-03-04: qty 1

## 2021-03-04 MED ORDER — SODIUM CHLORIDE 0.9 % IR SOLN
Status: DC | PRN
  Administered 2021-03-04 (×4): 3000 mL

## 2021-03-04 MED ORDER — CHLORHEXIDINE GLUCONATE 0.12 % MT SOLN: 15.0000 mL | Freq: Once | OROMUCOSAL | Status: DC

## 2021-03-04 MED ORDER — FENTANYL CITRATE (PF) 100 MCG/2ML IJ SOLN
INTRAMUSCULAR | Status: DC | PRN
  Administered 2021-03-04 (×3): 25 ug via INTRAVENOUS
  Administered 2021-03-04: 50 ug via INTRAVENOUS
  Administered 2021-03-04: 25 ug via INTRAVENOUS

## 2021-03-04 MED ORDER — MIDAZOLAM HCL 5 MG/5ML IJ SOLN
INTRAMUSCULAR | Status: DC | PRN
  Administered 2021-03-04: 2 mg via INTRAVENOUS

## 2021-03-04 MED ORDER — ONDANSETRON HCL 4 MG/2ML IJ SOLN
INTRAMUSCULAR | Status: DC | PRN
  Administered 2021-03-04: 4 mg via INTRAVENOUS

## 2021-03-04 MED ORDER — LACTATED RINGERS IV SOLN: INTRAVENOUS | Status: DC

## 2021-03-04 MED ORDER — LIDOCAINE HCL 1 % IJ SOLN
INTRAMUSCULAR | Status: DC | PRN
  Administered 2021-03-04: 75 mg via INTRADERMAL

## 2021-03-04 MED ORDER — HYDROMORPHONE HCL 1 MG/ML IJ SOLN: 0.2500 mg | INTRAMUSCULAR | Status: DC | PRN

## 2021-03-04 MED ORDER — LIDOCAINE HCL (PF) 2 % IJ SOLN
INTRAMUSCULAR | Status: AC
  Filled 2021-03-04: qty 5

## 2021-03-04 MED ORDER — PROPOFOL 10 MG/ML IV BOLUS
INTRAVENOUS | Status: AC
  Filled 2021-03-04: qty 20

## 2021-03-04 MED ORDER — WATER FOR IRRIGATION, STERILE IR SOLN
Status: DC | PRN
  Administered 2021-03-04: 500 mL

## 2021-03-04 MED ORDER — MIDAZOLAM HCL 2 MG/2ML IJ SOLN
INTRAMUSCULAR | Status: AC
  Filled 2021-03-04: qty 2

## 2021-03-04 MED ORDER — ONDANSETRON HCL 4 MG/2ML IJ SOLN: 4.0000 mg | Freq: Once | INTRAMUSCULAR | Status: DC | PRN

## 2021-03-04 MED ORDER — ONDANSETRON HCL 4 MG/2ML IJ SOLN
INTRAMUSCULAR | Status: AC
  Filled 2021-03-04: qty 2

## 2021-03-04 MED ORDER — PROPOFOL 10 MG/ML IV BOLUS
INTRAVENOUS | Status: DC | PRN
  Administered 2021-03-04: 175 mg via INTRAVENOUS

## 2021-03-04 MED ORDER — OXYCODONE-ACETAMINOPHEN 5-325 MG PO TABS: 1.0000 | ORAL_TABLET | ORAL | 0 refills | Status: AC | PRN

## 2021-03-04 MED ORDER — FENTANYL CITRATE (PF) 250 MCG/5ML IJ SOLN
INTRAMUSCULAR | Status: AC
  Filled 2021-03-04: qty 5

## 2021-03-04 MED ORDER — SODIUM CHLORIDE 0.9 % IV SOLN
2.0000 g | INTRAVENOUS | Status: AC
  Administered 2021-03-04: 2 g via INTRAVENOUS
  Filled 2021-03-04: qty 20

## 2021-03-04 MED ORDER — ORAL CARE MOUTH RINSE: 15.0000 mL | Freq: Once | OROMUCOSAL | Status: DC

## 2021-03-04 SURGICAL SUPPLY — 26 items
BAG DRAIN URO TABLE W/ADPT NS (BAG) ×3 IMPLANT
BAG DRN 8 ADPR NS SKTRN CSTL (BAG) ×2
BAG DRN URN TUBE DRIP CHMBR (OSTOMY) ×2
BAG HAMPER (MISCELLANEOUS) ×3 IMPLANT
BAG URINE DRAIN TURP 4L (OSTOMY) ×3 IMPLANT
CATH FOLEY 3WAY 30CC 22F (CATHETERS) ×3 IMPLANT
CLOTH BEACON ORANGE TIMEOUT ST (SAFETY) ×3 IMPLANT
ELECT REM PT RETURN 9FT ADLT (ELECTROSURGICAL) ×3
ELECTRODE REM PT RTRN 9FT ADLT (ELECTROSURGICAL) ×2 IMPLANT
GLOVE SURG POLYISO LF SZ8 (GLOVE) ×3 IMPLANT
GLOVE SURG UNDER POLY LF SZ7 (GLOVE) ×6 IMPLANT
GOWN STRL REUS W/TWL LRG LVL3 (GOWN DISPOSABLE) ×6 IMPLANT
GOWN STRL REUS W/TWL XL LVL3 (GOWN DISPOSABLE) ×3 IMPLANT
IV NS IRRIG 3000ML ARTHROMATIC (IV SOLUTION) ×12 IMPLANT
KIT TURNOVER CYSTO (KITS) ×3 IMPLANT
LOOP CUT BIPOLAR 24F LRG (ELECTROSURGICAL) ×3 IMPLANT
MANIFOLD NEPTUNE II (INSTRUMENTS) ×3 IMPLANT
PACK CYSTO (CUSTOM PROCEDURE TRAY) ×3 IMPLANT
PAD ARMBOARD 7.5X6 YLW CONV (MISCELLANEOUS) ×3 IMPLANT
PLUG CATH AND CAP STER (CATHETERS) ×3 IMPLANT
SYR 30ML LL (SYRINGE) ×3 IMPLANT
SYR TOOMEY IRRIG 70ML (MISCELLANEOUS) ×3
SYRINGE TOOMEY IRRIG 70ML (MISCELLANEOUS) ×2 IMPLANT
TOWEL NATURAL 4PK STERILE (DISPOSABLE) ×3 IMPLANT
TOWEL OR 17X26 4PK STRL BLUE (TOWEL DISPOSABLE) ×3 IMPLANT
WATER STERILE IRR 500ML POUR (IV SOLUTION) ×3 IMPLANT

## 2021-03-04 NOTE — Anesthesia Preprocedure Evaluation (Addendum)
Anesthesia Evaluation  Patient identified by MRN, date of birth, ID band Patient awake    Reviewed: Allergy & Precautions, H&P , NPO status , Patient's Chart, lab work & pertinent test results  Airway Mallampati: I  TM Distance: >3 FB Neck ROM: Limited   Comment: Neck sx Dental  (+) Dental Advisory Given, Teeth Intact   Pulmonary sleep apnea and Continuous Positive Airway Pressure Ventilation ,    Pulmonary exam normal breath sounds clear to auscultation       Cardiovascular Exercise Tolerance: Good + CAD  Normal cardiovascular exam Rhythm:Regular Rate:Normal  28-Feb-2021 13:06:17 Max System-AP-OPS ROUTINE RECORD May 20, 1955 (73 yr) Male Caucasian Vent. rate 115 BPM PR interval 156 ms QRS duration 92 ms QT/QTcB 330/456 ms P-R-T axes 34 67 10 Sinus tachycardia Nonspecific ST abnormality Abnormal ECG Confirmed by Cherlynn Kaiser [phone]) on 03/02/2021 12:45:13 PM   Neuro/Psych negative neurological ROS  negative psych ROS   GI/Hepatic negative GI ROS, Neg liver ROS,   Endo/Other  Hypothyroidism   Renal/GU Renal InsufficiencyRenal disease  negative genitourinary   Musculoskeletal  (+) Arthritis , Osteoarthritis,    Abdominal   Peds negative pediatric ROS (+)  Hematology negative hematology ROS (+)   Anesthesia Other Findings   Reproductive/Obstetrics negative OB ROS                         Anesthesia Physical Anesthesia Plan  ASA: 3  Anesthesia Plan: General   Post-op Pain Management: Dilaudid IV   Induction: Intravenous  PONV Risk Score and Plan: 4 or greater and Ondansetron and Dexamethasone  Airway Management Planned: Oral ETT and LMA  Additional Equipment:   Intra-op Plan:   Post-operative Plan: Extubation in OR  Informed Consent: I have reviewed the patients History and Physical, chart, labs and discussed the procedure including the risks, benefits and  alternatives for the proposed anesthesia with the patient or authorized representative who has indicated his/her understanding and acceptance.     Dental advisory given  Plan Discussed with: CRNA and Surgeon  Anesthesia Plan Comments:       Anesthesia Quick Evaluation

## 2021-03-04 NOTE — Anesthesia Procedure Notes (Addendum)
Procedure Name: LMA Insertion Date/Time: 03/04/2021 8:04 AM Performed by: Ollen Bowl, CRNA Pre-anesthesia Checklist: Patient identified, Patient being monitored, Emergency Drugs available, Timeout performed and Suction available Patient Re-evaluated:Patient Re-evaluated prior to induction Oxygen Delivery Method: Circle System Utilized Preoxygenation: Pre-oxygenation with 100% oxygen Induction Type: IV induction Ventilation: Mask ventilation without difficulty LMA: LMA inserted LMA Size: 4.0 Number of attempts: 1 Placement Confirmation: positive ETCO2 and breath sounds checked- equal and bilateral

## 2021-03-04 NOTE — Transfer of Care (Signed)
Immediate Anesthesia Transfer of Care Note  Patient: Adrian Marshall [facility]  Procedure(s) Performed: CYSTOSCOPY ([place]) TRANSURETHRAL RESECTION OF THE PROSTATE (TURP) (Prostate)  Patient Location: PACU  Anesthesia Type:General  Level of Consciousness: awake  Airway & Oxygen Therapy: Patient Spontanous Breathing  Post-op Assessment: Report given to RN  Post vital signs: Reviewed and stable  Last Vitals:  Vitals Value Taken Time  BP 140/76 03/04/21 0904  Temp    Pulse 86 03/04/21 0906  Resp 14 03/04/21 0906  SpO2 94 % 03/04/21 0906  Vitals shown include unvalidated device data.  Last Pain:  Vitals:   03/04/21 0705  TempSrc: Oral  PainSc: 0-No pain      Patients Stated Pain Goal: 7 (09/64/38 [id number])  Complications: No notable events documented.

## 2021-03-04 NOTE — Anesthesia Postprocedure Evaluation (Signed)
Anesthesia Post Note  Patient: Adrian Marshall  Procedure(s) Performed: CYSTOSCOPY ([place]) TRANSURETHRAL RESECTION OF THE PROSTATE (TURP) (Prostate)  Patient location during evaluation: PACU Anesthesia Type: General Level of consciousness: awake and alert and oriented Pain management: pain level controlled Vital Signs Assessment: post-procedure vital signs reviewed and stable Respiratory status: spontaneous breathing, nonlabored ventilation and respiratory function stable Cardiovascular status: blood pressure returned to baseline and stable Postop Assessment: no apparent nausea or vomiting Anesthetic complications: no   No notable events documented.   Last Vitals:  Vitals:   03/04/21 0930 03/04/21 0945  BP: 135/76 (!) 152/92  Pulse: 80 78  Resp: 14 18  Temp:    SpO2: 95% 96%    Last Pain:  Vitals:   03/04/21 0943  TempSrc:   PainSc: 2                  Dara Camargo C Yordan Martindale

## 2021-03-04 NOTE — Interval H&P Note (Signed)
History and Physical Interval Note:  03/04/2021 7:41 AM  Adrian Marshall  has presented today for surgery, with the diagnosis of BPH with incomplete emptying.  The various methods of treatment have been discussed with the patient and family. After consideration of risks, benefits and other options for treatment, the patient has consented to  Procedure(s): CYSTOSCOPY (N/A) [place] (TURP) (N/A) as a surgical intervention.  The patient's history has been reviewed, patient examined, no change in status, stable for surgery.  I have reviewed the patient's chart and labs.  Questions were answered to the patient's satisfaction.     Nicolette Bang

## 2021-03-04 NOTE — Op Note (Signed)
Preoperative diagnosis: BPH with incomplete bladder emptying  Postoperative diagnosis: BPH  Procedure: 1 cystoscopy 2. Transurethral resection of the prostate  Attending: Nicolette Bang  Anesthesia: General  Estimated blood loss: Minimal  Drains: 22 French foley  Specimens: 1. Prostate Chips  Antibiotics: Rocephin  Findings: bilobar prostate enlargement. Ureteral orifices in normal anatomic location.   Indications: Patient is a 65 year old male with a history of BPH and elevated PVR.  After discussing treatment options, they decided proceed with transurethral resection of the prostate.  Procedure in detail: The patient was brought to the operating room and a brief timeout was done to ensure correct patient, correct procedure, correct site.  General anesthesia was administered patient was placed in dorsal lithotomy position.  Their genitalia was then prepped and draped in usual sterile fashion.  A rigid 68 French cystoscope was passed in the urethra and the bladder.  Bladder was inspected and we noted no masses or lesions.  the ureteral orifices were in the normal orthotopic locations. removed the cystoscope and placed a resectoscope into the bladder. We then turned our attention to the prostate resection. Using the bipolar resectoscope we resected the median lobe first from the bladder neck to the verumontanum. We then started at the 12 oclock position on the left lobe and resection to the 6 o'clock position from the bladder neck to the verumontanum. We then did the same resection of the right lobe. Once the resection was complete we then cauterized individual bleeders. We then removed the prostate chips and sent them for pathology.  We then re-inspected the prostatic fossa and found no residual bleeding.  the bladder was then drained, a 22 French foley was placed and this concluded the procedure which was well tolerated by patient.  Complications: None  Condition: Stable, extubated,  transferred to PACU  Plan: Patient is to be discharged home and followup in 5 days for foley catheter removal and pathology discussion.

## 2021-03-05 ENCOUNTER — Encounter (HOSPITAL_COMMUNITY): Payer: Self-pay | Admitting: Urology

## 2021-03-06 ENCOUNTER — Telehealth: Payer: Self-pay

## 2021-03-06 NOTE — Telephone Encounter (Signed)
Patient wife called today to inquire about something to help with post op constipation.  Encouraged patient to increase fluid intake, walk as tolerated and try miralax or ducolax suppository. Voiced understanding.   No other post issues noted

## 2021-03-10 LAB — SURGICAL PATHOLOGY

## 2021-03-11 ENCOUNTER — Ambulatory Visit (INDEPENDENT_AMBULATORY_CARE_PROVIDER_SITE_OTHER): Payer: Medicare Other | Admitting: Urology

## 2021-03-11 ENCOUNTER — Encounter: Payer: Self-pay | Admitting: Urology

## 2021-03-11 ENCOUNTER — Other Ambulatory Visit: Payer: Self-pay

## 2021-03-11 VITALS — BP 141/82 | HR 142

## 2021-03-11 DIAGNOSIS — N401 Enlarged prostate with lower urinary tract symptoms: Secondary | ICD-10-CM

## 2021-03-11 DIAGNOSIS — N138 Other obstructive and reflux uropathy: Secondary | ICD-10-CM

## 2021-03-11 DIAGNOSIS — N201 Calculus of ureter: Secondary | ICD-10-CM

## 2021-03-11 NOTE — Addendum Note (Signed)
Addended by: Cleon Gustin on: 03/11/2021 10:46 AM   Modules accepted: Orders

## 2021-03-11 NOTE — Patient Instructions (Signed)

## 2021-03-11 NOTE — Progress Notes (Signed)
03/11/2021 10:22 AM   Adrian Marshall 1955/07/08 [id number]  Referring provider: Clinic, [facility] [address] [place],  [place] [address]  Followup after TURP   HPI: Mr Zappone is a 65yo here for followup after TURP. Pathology benign. Urine clear.   PMH: Past Medical History:  Diagnosis Date   Arthritis    NECK   BPH (benign prostatic hyperplasia)    Cancer ([place])    SKIN CANCER-BASAL   Coronary artery disease    History of kidney stones    Hypercholesterolemia    Hypothyroidism    Renal disorder    kidney stones   Sleep apnea    USES CPAP   Thyroid disease     Surgical History: Past Surgical History:  Procedure Laterality Date   ANKLE SURGERY     CYSTOSCOPY N/A 03/04/2021   Procedure: CYSTOSCOPY;  Surgeon: Cleon Gustin, MD;  Location: [facility];  Service: Urology;  Laterality: N/A;   CYSTOSCOPY WITH RETROGRADE PYELOGRAM, URETEROSCOPY AND STENT PLACEMENT Left 08/16/2017   Procedure: CYSTOSCOPY WITH RETROGRADE PYELOGRAM, URETEROSCOPY AND STENT PLACEMENT;  Surgeon: Hollice Espy, MD;  Location: [facility];  Service: Urology;  Laterality: Left;   HERNIA REPAIR     KNEE SURGERY     NECK SURGERY     SHOULDER SURGERY     TRANSURETHRAL RESECTION OF PROSTATE N/A 07/20/2017   Procedure: TRANSURETHRAL RESECTION OF THE PROSTATE (TURP);  Surgeon: Abbie Sons, MD;  Location: [facility];  Service: Urology;  Laterality: N/A;   TRANSURETHRAL RESECTION OF PROSTATE N/A 03/04/2021   Procedure: TRANSURETHRAL RESECTION OF THE PROSTATE (TURP);  Surgeon: Cleon Gustin, MD;  Location: [facility];  Service: Urology;  Laterality: N/A;   URETEROSCOPY WITH HOLMIUM LASER LITHOTRIPSY Left 08/16/2017   Procedure: URETEROSCOPY WITH HOLMIUM LASER LITHOTRIPSY;  Surgeon: Hollice Espy, MD;  Location: [facility];  Service: Urology;  Laterality: Left;   VASECTOMY      Home Medications:  Allergies as of 03/11/2021       Reactions   Cortisone    inj to  shoulder - paralysis   Celestone Soluspan [betamethasone]    Other reaction(s): Numbness   Neurontin [gabapentin]    Altered mental state   Other    Steroids - paralysis         Medication List        Accurate as of March 11, 2021 10:22 AM. If you have any questions, ask your nurse or doctor.          aspirin EC 81 MG tablet Take 81 mg by mouth daily.   atorvastatin 80 MG tablet Commonly known as: LIPITOR Take 80 mg by mouth every evening.   latanoprost 0.005 % ophthalmic solution Commonly known as: XALATAN Place 1 drop into both eyes at bedtime.   levothyroxine 88 MCG tablet Commonly known as: SYNTHROID Take 176 mcg by mouth daily before breakfast.   oxyCODONE-acetaminophen 5-325 MG tablet Commonly known as: Percocet Take 1 tablet by mouth every 4 (four) hours as needed for severe pain.   tamsulosin 0.4 MG Caps capsule Commonly known as: FLOMAX Take 0.8 mg by mouth daily after supper.   Urocit-K 10 10 MEQ (1080 MG) SR tablet Generic drug: potassium citrate Take 10 mEq by mouth 2 (two) times daily with a meal.        Allergies:  Allergies  Allergen Reactions   Cortisone     inj to shoulder - paralysis   Celestone Soluspan [Betamethasone]  Other reaction(s): Numbness   Neurontin [Gabapentin]     Altered mental state   Other     Steroids - paralysis     Family History: Family History  Problem Relation Age of Onset   Prostate cancer Neg Hx    Bladder Cancer Neg Hx    Kidney cancer Neg Hx     Social History:  reports that he has never smoked. He has never used smokeless tobacco. He reports current alcohol use. He reports that he does not use drugs.  ROS: All other review of systems were reviewed and are negative except what is noted above in HPI  Physical Exam: BP (!) 141/82   Pulse (!) 142   Constitutional:  Alert and oriented, No acute distress. HEENT: [place] AT, moist mucus membranes.  Trachea midline, no masses. Cardiovascular: No  clubbing, cyanosis, or edema. Respiratory: Normal respiratory effort, no increased work of breathing. GI: Abdomen is soft, nontender, nondistended, no abdominal masses GU: No CVA tenderness.  Lymph: No cervical or inguinal lymphadenopathy. Skin: No rashes, bruises or suspicious lesions. Neurologic: Grossly intact, no focal deficits, moving all 4 extremities. Psychiatric: Normal mood and affect.  Laboratory Data: Lab Results  Component Value Date   WBC 6.4 02/28/2021   HGB 15.1 02/28/2021   HCT 45.7 02/28/2021   MCV 93.5 02/28/2021   PLT 227 02/28/2021    Lab Results  Component Value Date   CREATININE 1.21 02/28/2021    Lab Results  Component Value Date   PSA 1.21 Test Methodology: [employer] PSA 08/16/2008    No results found for: TESTOSTERONE  No results found for: HGBA1C  Urinalysis    Component Value Date/Time   COLORURINE AMBER (A) 01/31/2021 0923   APPEARANCEUR Clear 02/08/2021 0945   LABSPEC >1.030 (H) 01/31/2021 0923   PHURINE 5.5 01/31/2021 0923   GLUCOSEU Negative 02/08/2021 0945   HGBUR LARGE (A) 01/31/2021 0923   BILIRUBINUR Negative 02/08/2021 0945   KETONESUR 15 (A) 01/31/2021 0923   PROTEINUR Negative 02/08/2021 0945   PROTEINUR 100 (A) 01/31/2021 0923   UROBILINOGEN 0.2 09/17/2019 0832   UROBILINOGEN 0.2 08/16/2008 1556   NITRITE Negative 02/08/2021 0945   NITRITE NEGATIVE 01/31/2021 0923   LEUKOCYTESUR Negative 02/08/2021 0945   LEUKOCYTESUR SMALL (A) 01/31/2021 0923    Lab Results  Component Value Date   LABMICR See below: 02/08/2021   WBCUA None seen 02/08/2021   RBCUA 3-10 (A) 03/17/2018   LABEPIT None seen 02/08/2021   MUCUS Present (A) 11/30/2018   BACTERIA None seen 02/08/2021    Pertinent Imaging:  Results for orders placed during the hospital encounter of 09/17/19  DG Abdomen 1 View  Narrative CLINICAL DATA:  Possible kidney stones. LOWER abdominal pain and perineum/penile pain, burning, and pain with urination for 3  days. History of kidney stones and prostate surgery.  EXAM: ABDOMEN - 1 VIEW  COMPARISON:  11/20/2018  FINDINGS: Punctate calcification overlies the RIGHT renal shadow, measuring approximately 3 millimeters. No calcifications identified in the expected locations of the ureters. A 7 millimeter calcification is seen at the midline just inferior to the symphysis pubis, possibly representing urethral stone or soft tissue calcification.  Visualized bowel gas pattern is nonobstructive. Moderate stool burden. Visualized osseous structures have a normal appearance.  IMPRESSION: 1. RIGHT nephrolithiasis. 2. Possible urethral stone or soft tissue calcification. Consider CT of the abdomen and pelvis stone protocol for further characterization. 3. Nonobstructive bowel gas pattern. Moderate stool burden.  These results will be called to the ordering  clinician or representative by the Radiologist Assistant, and communication documented in the PACS or [employer].   Electronically Signed By: Nolon Nations M.D. On: 09/17/2019 09:19  No results found for this or any previous visit.  No results found for this or any previous visit.  No results found for this or any previous visit.  Results for orders placed during the hospital encounter of 08/21/17  US RENAL  Narrative CLINICAL DATA:  Left ureteral stone.  EXAM: RENAL / URINARY TRACT ULTRASOUND COMPLETE  COMPARISON:  None.  FINDINGS: Right Kidney:  Length: 10.4 cm. Echogenicity within normal limits. No mass or hydronephrosis visualized. 3 mm calculus within the lower pole of the right kidney, nonobstructive.  Left Kidney:  Length: 11.7 cm. Echogenicity within normal limits. No mass or hydronephrosis visualized.  Bladder:  Appears normal for degree of bladder distention. Bilateral ureteral jets seen.  IMPRESSION: 3 mm nonobstructive right lower pole renal calculus,  otherwise unremarkable.   Electronically Signed By: Fidela Salisbury M.D. On: 08/21/2017 23:18  No results found for this or any previous visit.  No results found for this or any previous visit.  Results for orders placed during the hospital encounter of 01/31/21  CT Renal Stone Study  Narrative CLINICAL DATA:  Flank pain, kidney stone suspected  EXAM: CT ABDOMEN AND PELVIS WITHOUT CONTRAST  TECHNIQUE: Multidetector CT imaging of the abdomen and pelvis was performed following the standard protocol without IV contrast.  COMPARISON:  03/29/2020  FINDINGS: Lower chest: No acute abnormality.  Hepatobiliary: Mildly decreased attenuation of the hepatic parenchyma compatible with hepatic steatosis. No focal liver lesion identified on unenhanced imaging. Unremarkable gallbladder. No hyperdense gallstone. No biliary dilatation.  Pancreas: Unremarkable. No pancreatic ductal dilatation or surrounding inflammatory changes.  Spleen: Normal in size without focal abnormality.  Adrenals/Urinary Tract: Unremarkable adrenal glands. Bilateral kidneys have an unremarkable unenhanced appearance. No renal stone or hydronephrosis. Bilateral ureters are nondilated. No ureteral calculi are seen. Urinary bladder is decompressed, limiting its evaluation. There appears to be bladder wall thickening and there is slight perivesicular fat stranding, which may represent cystitis.  Stomach/Bowel: Stomach within normal limits. 12 mm fat density lipoma is noted within the second portion of the duodenum (series 2, image 32). No dilated loops of bowel. Numerous colonic diverticula. No focal bowel wall thickening or inflammatory changes.  Vascular/Lymphatic: Aortic atherosclerosis. No enlarged abdominal or pelvic lymph nodes.  Reproductive: Mild prostatomegaly. There are scattered dystrophic calcifications within the prostate. Faint tiny calcification within the prostate gland at the midline  (series 2, image 88) which could potentially represent a calculus within the prostatic portion of the urethra given the patient's symptoms.  Other: No free fluid. No abdominopelvic fluid collection. No pneumoperitoneum. No abdominal wall hernia.  Musculoskeletal: No acute or significant osseous findings.  IMPRESSION: 1. Faint tiny calcification within the prostate gland at the midline which could potentially represent a calculus within the prostatic portion of the urethra given the patient's symptoms. 2. Urinary bladder is decompressed, limiting its evaluation. There appears to be bladder wall thickening and slight perivesicular fat stranding, which may represent cystitis. Correlation with urinalysis is recommended. 3. No renal or ureteral calculi.  No hydronephrosis. 4. Colonic diverticulosis without evidence of acute diverticulitis. 5. Hepatic steatosis.  Aortic Atherosclerosis (ICD10-I70.0).   Electronically Signed By: Davina Poke D.O. On: 01/31/2021 10:36   Assessment & Plan:    1. Benign prostatic hyperplasia with urinary obstruction -RTC 1 week for PVR and then 3 months with MD  No follow-ups on file.  Nicolette Bang, MD  [facility] [place]

## 2021-03-11 NOTE — Progress Notes (Signed)
Fill and Pull Catheter Removal  Patient is present today for a catheter removal.  Patient was cleaned and prepped in a sterile fashion 123ml of sterile water/ saline was instilled into the bladder when the patient felt the urge to urinate. 38ml of water was then drained from the balloon.  A 18FR foley cath was removed from the bladder no complications were noted .  Patient as then given some time to void on their own.  Patient can void  11ml on their own after some time.  Patient tolerated well.  Performed by: Lee Kalt LPN  Follow up/ Additional notes: per MD note

## 2021-03-18 ENCOUNTER — Ambulatory Visit (INDEPENDENT_AMBULATORY_CARE_PROVIDER_SITE_OTHER): Payer: Medicare Other

## 2021-03-18 ENCOUNTER — Other Ambulatory Visit: Payer: Self-pay

## 2021-03-18 DIAGNOSIS — N401 Enlarged prostate with lower urinary tract symptoms: Secondary | ICD-10-CM | POA: Diagnosis not present

## 2021-03-18 DIAGNOSIS — N138 Other obstructive and reflux uropathy: Secondary | ICD-10-CM | POA: Diagnosis not present

## 2021-03-18 NOTE — Progress Notes (Signed)
post void residual=8 

## 2021-04-01 ENCOUNTER — Telehealth: Payer: Self-pay

## 2021-04-01 DIAGNOSIS — R300 Dysuria: Secondary | ICD-10-CM

## 2021-04-01 NOTE — Telephone Encounter (Signed)
Patient called stating at the beginning of his urine stream he continues to have burning but not like a UTI burn. He states he is also having discomfort in his rectum and perineum.  Patient states the symptoms come and go. Patient is 1 month post TURP. Please advise.

## 2021-04-16 MED ORDER — PHENAZOPYRIDINE HCL 200 MG PO TABS: 200.0000 mg | ORAL_TABLET | Freq: Three times a day (TID) | ORAL | 0 refills | Status: AC | PRN

## 2021-04-16 NOTE — Telephone Encounter (Signed)
Rx sent.  Patient states he has dealt with prostatitis for years and thinks this may be the cause of his sympyoms.  Patient will come leave urine specimen in a.m.

## 2021-04-17 ENCOUNTER — Other Ambulatory Visit: Payer: Medicare Other

## 2021-04-17 ENCOUNTER — Telehealth: Payer: Self-pay

## 2021-04-17 ENCOUNTER — Other Ambulatory Visit: Payer: Self-pay

## 2021-04-17 DIAGNOSIS — R300 Dysuria: Secondary | ICD-10-CM

## 2021-04-17 LAB — URINALYSIS, ROUTINE W REFLEX MICROSCOPIC
Bilirubin, UA: NEGATIVE
Glucose, UA: NEGATIVE
Ketones, UA: NEGATIVE
Nitrite, UA: NEGATIVE
Protein,UA: NEGATIVE
Specific Gravity, UA: 1.015 (ref 1.005–1.030)
Urobilinogen, Ur: 0.2 mg/dL (ref 0.2–1.0)
pH, UA: 6.5 (ref 5.0–7.5)

## 2021-04-17 LAB — MICROSCOPIC EXAMINATION
Renal Epithel, UA: NONE SEEN /hpf
WBC, UA: >30 /hpf — AB (ref 0–5)

## 2021-04-18 MED ORDER — DOXYCYCLINE HYCLATE 100 MG PO CAPS: 100.0000 mg | ORAL_CAPSULE | Freq: Two times a day (BID) | ORAL | 0 refills | Status: DC

## 2021-04-18 NOTE — Telephone Encounter (Signed)
Patient called this am still having prostatitis symptoms. Reviewed UA with Dr. Alyson Ingles and new order for doxycycline 100mg  BID for 21 days sent to pharmacy. Patient notified and will pick up rx.  Patient understands if symptoms continue after completion will obtain urine culture.

## 2021-05-10 ENCOUNTER — Ambulatory Visit (INDEPENDENT_AMBULATORY_CARE_PROVIDER_SITE_OTHER): Payer: Medicare Other | Admitting: Urology

## 2021-05-10 ENCOUNTER — Encounter: Payer: Self-pay | Admitting: Urology

## 2021-05-10 ENCOUNTER — Other Ambulatory Visit: Payer: Self-pay

## 2021-05-10 VITALS — BP 124/80 | HR 105

## 2021-05-10 DIAGNOSIS — R3912 Poor urinary stream: Secondary | ICD-10-CM | POA: Diagnosis not present

## 2021-05-10 LAB — URINALYSIS, ROUTINE W REFLEX MICROSCOPIC
Bilirubin, UA: NEGATIVE
Glucose, UA: NEGATIVE
Ketones, UA: NEGATIVE
Nitrite, UA: NEGATIVE
Protein,UA: NEGATIVE
Specific Gravity, UA: 1.01 (ref 1.005–1.030)
Urobilinogen, Ur: 0.2 mg/dL (ref 0.2–1.0)
pH, UA: 6 (ref 5.0–7.5)

## 2021-05-10 LAB — MICROSCOPIC EXAMINATION
Bacteria, UA: NONE SEEN
Renal Epithel, UA: NONE SEEN /hpf

## 2021-05-10 LAB — BLADDER SCAN AMB NON-IMAGING: Scan Result: 2

## 2021-05-10 MED ORDER — GEMTESA 75 MG PO TABS: 1.0000 | ORAL_TABLET | Freq: Every day | ORAL | 0 refills | Status: DC

## 2021-05-10 NOTE — Progress Notes (Signed)
post void residual =2ml 

## 2021-05-10 NOTE — Progress Notes (Signed)
05/10/2021 12:00 PM   [place] 1955/09/25 [id number]  Referring provider: Clinic, [facility] [place],  [place] [address]  Followup after TURP   HPI: Adrian Marshall is a 66yo here for followup after TURP. Urine stream strong. No dysuria or hematuria. He has moderate urinary urgency which is bothersome to him. No other complaints today   PMH: Past Medical History:  Diagnosis Date   Arthritis    NECK   BPH (benign prostatic hyperplasia)    Cancer ([place])    SKIN CANCER-BASAL   Coronary artery disease    History of kidney stones    Hypercholesterolemia    Hypothyroidism    Renal disorder    kidney stones   Sleep apnea    USES CPAP   Thyroid disease     Surgical History: Past Surgical History:  Procedure Laterality Date   ANKLE SURGERY     CYSTOSCOPY N/A 03/04/2021   Procedure: CYSTOSCOPY;  Surgeon: Cleon Gustin, MD;  Location: [facility];  Service: Urology;  Laterality: N/A;   CYSTOSCOPY WITH RETROGRADE PYELOGRAM, URETEROSCOPY AND STENT PLACEMENT Left 08/16/2017   Procedure: CYSTOSCOPY WITH RETROGRADE PYELOGRAM, URETEROSCOPY AND STENT PLACEMENT;  Surgeon: Hollice Espy, MD;  Location: [facility];  Service: Urology;  Laterality: Left;   HERNIA REPAIR     KNEE SURGERY     NECK SURGERY     SHOULDER SURGERY     TRANSURETHRAL RESECTION OF PROSTATE N/A 07/20/2017   Procedure: TRANSURETHRAL RESECTION OF THE PROSTATE (TURP);  Surgeon: Abbie Sons, MD;  Location: [facility];  Service: Urology;  Laterality: N/A;   TRANSURETHRAL RESECTION OF PROSTATE N/A 03/04/2021   Procedure: TRANSURETHRAL RESECTION OF THE PROSTATE (TURP);  Surgeon: Cleon Gustin, MD;  Location: [facility];  Service: Urology;  Laterality: N/A;   URETEROSCOPY WITH HOLMIUM LASER LITHOTRIPSY Left 08/16/2017   Procedure: URETEROSCOPY WITH HOLMIUM LASER LITHOTRIPSY;  Surgeon: Hollice Espy, MD;  Location: [facility];  Service: Urology;  Laterality: Left;   VASECTOMY       Home Medications:  Allergies as of 05/10/2021       Reactions   Cortisone    inj to shoulder - paralysis   Celestone Soluspan [betamethasone]    Other reaction(s): Numbness   Neurontin [gabapentin]    Altered mental state   Other    Steroids - paralysis         Medication List        Accurate as of May 10, 2021 12:00 PM. If you have any questions, ask your nurse or doctor.          aspirin EC 81 MG tablet Take 81 mg by mouth daily.   atorvastatin 80 MG tablet Commonly known as: LIPITOR Take 80 mg by mouth every evening.   doxycycline 100 MG capsule Commonly known as: VIBRAMYCIN Take 1 capsule (100 mg total) by mouth every 12 (twelve) hours.   latanoprost 0.005 % ophthalmic solution Commonly known as: XALATAN Place 1 drop into both eyes at bedtime.   levothyroxine 88 MCG tablet Commonly known as: SYNTHROID Take 176 mcg by mouth daily before breakfast.   oxyCODONE-acetaminophen 5-325 MG tablet Commonly known as: Percocet Take 1 tablet by mouth every 4 (four) hours as needed for severe pain.   phenazopyridine 200 MG tablet Commonly known as: Pyridium Take 1 tablet (200 mg total) by mouth 3 (three) times daily as needed for pain.   tamsulosin 0.4 MG Caps capsule Commonly known as: FLOMAX Take 0.8  mg by mouth daily after supper.   Urocit-K 10 10 MEQ (1080 MG) SR tablet Generic drug: potassium citrate Take 10 mEq by mouth 2 (two) times daily with a meal.        Allergies:  Allergies  Allergen Reactions   Cortisone     inj to shoulder - paralysis   Celestone Soluspan [Betamethasone]     Other reaction(s): Numbness   Neurontin [Gabapentin]     Altered mental state   Other     Steroids - paralysis     Family History: Family History  Problem Relation Age of Onset   Prostate cancer Neg Hx    Bladder Cancer Neg Hx    Kidney cancer Neg Hx     Social History:  reports that he has never smoked. He has never used smokeless tobacco. He  reports current alcohol use. He reports that he does not use drugs.  ROS: All other review of systems were reviewed and are negative except what is noted above in HPI  Physical Exam: BP 124/80    Pulse (!) 105   Constitutional:  Alert and oriented, No acute distress. HEENT: [place] AT, moist mucus membranes.  Trachea midline, no masses. Cardiovascular: No clubbing, cyanosis, or edema. Respiratory: Normal respiratory effort, no increased work of breathing. GI: Abdomen is soft, nontender, nondistended, no abdominal masses GU: No CVA tenderness.  Lymph: No cervical or inguinal lymphadenopathy. Skin: No rashes, bruises or suspicious lesions. Neurologic: Grossly intact, no focal deficits, moving all 4 extremities. Psychiatric: Normal mood and affect.  Laboratory Data: Lab Results  Component Value Date   WBC 6.4 02/28/2021   HGB 15.1 02/28/2021   HCT 45.7 02/28/2021   MCV 93.5 02/28/2021   PLT 227 02/28/2021    Lab Results  Component Value Date   CREATININE 1.21 02/28/2021    Lab Results  Component Value Date   PSA 1.21 Test Methodology: Hybritech PSA 08/16/2008    No results found for: TESTOSTERONE  No results found for: HGBA1C  Urinalysis    Component Value Date/Time   COLORURINE AMBER (A) 01/31/2021 0923   APPEARANCEUR Clear 04/17/2021 0946   LABSPEC >1.030 (H) 01/31/2021 0923   PHURINE 5.5 01/31/2021 0923   GLUCOSEU Negative 04/17/2021 0946   HGBUR LARGE (A) 01/31/2021 0923   BILIRUBINUR Negative 04/17/2021 0946   KETONESUR 15 (A) 01/31/2021 0923   PROTEINUR Negative 04/17/2021 0946   PROTEINUR 100 (A) 01/31/2021 0923   UROBILINOGEN 0.2 09/17/2019 0832   UROBILINOGEN 0.2 08/16/2008 1556   NITRITE Negative 04/17/2021 0946   NITRITE NEGATIVE 01/31/2021 0923   LEUKOCYTESUR 2+ (A) 04/17/2021 0946   LEUKOCYTESUR SMALL (A) 01/31/2021 0923    Lab Results  Component Value Date   LABMICR See below: 04/17/2021   WBCUA >30 (A) 04/17/2021   RBCUA 3-10 (A) 03/17/2018    LABEPIT 0-10 04/17/2021   MUCUS Present (A) 11/30/2018   BACTERIA Moderate (A) 04/17/2021    Pertinent Imaging: \ Results for orders placed during the hospital encounter of 09/17/19  DG Abdomen 1 View  Narrative CLINICAL DATA:  Possible kidney stones. LOWER abdominal pain and perineum/penile pain, burning, and pain with urination for 3 days. History of kidney stones and prostate surgery.  EXAM: ABDOMEN - 1 VIEW  COMPARISON:  11/20/2018  FINDINGS: Punctate calcification overlies the RIGHT renal shadow, measuring approximately 3 millimeters. No calcifications identified in the expected locations of the ureters. A 7 millimeter calcification is seen at the midline just inferior to the symphysis pubis, possibly  representing urethral stone or soft tissue calcification.  Visualized bowel gas pattern is nonobstructive. Moderate stool burden. Visualized osseous structures have a normal appearance.  IMPRESSION: 1. RIGHT nephrolithiasis. 2. Possible urethral stone or soft tissue calcification. Consider CT of the abdomen and pelvis stone protocol for further characterization. 3. Nonobstructive bowel gas pattern. Moderate stool burden.  These results will be called to the ordering clinician or representative by the Radiologist Assistant, and communication documented in the PACS or [employer].   Electronically Signed By: Nolon Nations M.D. On: 09/17/2019 09:19  No results found for this or any previous visit.  No results found for this or any previous visit.  No results found for this or any previous visit.  Results for orders placed during the hospital encounter of 08/21/17  US RENAL  Narrative CLINICAL DATA:  Left ureteral stone.  EXAM: RENAL / URINARY TRACT ULTRASOUND COMPLETE  COMPARISON:  None.  FINDINGS: Right Kidney:  Length: 10.4 cm. Echogenicity within normal limits. No mass or hydronephrosis visualized. 3 mm calculus within the lower pole  of the right kidney, nonobstructive.  Left Kidney:  Length: 11.7 cm. Echogenicity within normal limits. No mass or hydronephrosis visualized.  Bladder:  Appears normal for degree of bladder distention. Bilateral ureteral jets seen.  IMPRESSION: 3 mm nonobstructive right lower pole renal calculus, otherwise unremarkable.   Electronically Signed By: Fidela Salisbury M.D. On: 08/21/2017 23:18  No results found for this or any previous visit.  No results found for this or any previous visit.  Results for orders placed during the hospital encounter of 01/31/21  CT Renal Stone Study  Narrative CLINICAL DATA:  Flank pain, kidney stone suspected  EXAM: CT ABDOMEN AND PELVIS WITHOUT CONTRAST  TECHNIQUE: Multidetector CT imaging of the abdomen and pelvis was performed following the standard protocol without IV contrast.  COMPARISON:  03/29/2020  FINDINGS: Lower chest: No acute abnormality.  Hepatobiliary: Mildly decreased attenuation of the hepatic parenchyma compatible with hepatic steatosis. No focal liver lesion identified on unenhanced imaging. Unremarkable gallbladder. No hyperdense gallstone. No biliary dilatation.  Pancreas: Unremarkable. No pancreatic ductal dilatation or surrounding inflammatory changes.  Spleen: Normal in size without focal abnormality.  Adrenals/Urinary Tract: Unremarkable adrenal glands. Bilateral kidneys have an unremarkable unenhanced appearance. No renal stone or hydronephrosis. Bilateral ureters are nondilated. No ureteral calculi are seen. Urinary bladder is decompressed, limiting its evaluation. There appears to be bladder wall thickening and there is slight perivesicular fat stranding, which may represent cystitis.  Stomach/Bowel: Stomach within normal limits. 12 mm fat density lipoma is noted within the second portion of the duodenum (series 2, image 32). No dilated loops of bowel. Numerous colonic diverticula. No focal  bowel wall thickening or inflammatory changes.  Vascular/Lymphatic: Aortic atherosclerosis. No enlarged abdominal or pelvic lymph nodes.  Reproductive: Mild prostatomegaly. There are scattered dystrophic calcifications within the prostate. Faint tiny calcification within the prostate gland at the midline (series 2, image 88) which could potentially represent a calculus within the prostatic portion of the urethra given the patient's symptoms.  Other: No free fluid. No abdominopelvic fluid collection. No pneumoperitoneum. No abdominal wall hernia.  Musculoskeletal: No acute or significant osseous findings.  IMPRESSION: 1. Faint tiny calcification within the prostate gland at the midline which could potentially represent a calculus within the prostatic portion of the urethra given the patient's symptoms. 2. Urinary bladder is decompressed, limiting its evaluation. There appears to be bladder wall thickening and slight perivesicular fat stranding, which may represent cystitis. Correlation with  urinalysis is recommended. 3. No renal or ureteral calculi.  No hydronephrosis. 4. Colonic diverticulosis without evidence of acute diverticulitis. 5. Hepatic steatosis.  Aortic Atherosclerosis (ICD10-I70.0).   Electronically Signed By: Davina Poke D.O. On: 01/31/2021 10:36   Assessment & Plan:    1. Weak urinary stream -resolved - Urinalysis, Routine w reflex microscopic - BLADDER SCAN AMB NON-IMAGING  2. Bladder spasms   No follow-ups on file.  Nicolette Bang, MD  [facility] [place]

## 2021-05-10 NOTE — Progress Notes (Signed)
Urological Symptom Review  Patient is experiencing the following symptoms: Burning/pain with urination Kidney stones  Review of Systems  Gastrointestinal (upper)  : Negative for upper GI symptoms  Gastrointestinal (lower) : Negative for lower GI symptoms  Constitutional : Negative for symptoms  Skin: Negative for skin symptoms  Eyes: Negative for eye symptoms  Ear/Nose/Throat : Negative for Ear/Nose/Throat symptoms  Hematologic/Lymphatic: Negative for Hematologic/Lymphatic symptoms  Cardiovascular : Negative for cardiovascular symptoms  Respiratory : Negative for respiratory symptoms  Endocrine: Negative for endocrine symptoms  Musculoskeletal: Back/joint pain  Neurological: Negative for neurological symptoms  Psychologic: Negative for psychiatric symptoms

## 2021-05-10 NOTE — Patient Instructions (Signed)

## 2021-06-07 ENCOUNTER — Ambulatory Visit (HOSPITAL_COMMUNITY)
Admission: RE | Admit: 2021-06-07 | Discharge: 2021-06-07 | Disposition: A | Discharge status: Home / Self Care | Payer: Medicare Other | Source: Ambulatory Visit | Attending: Urology | Admitting: Urology

## 2021-06-07 ENCOUNTER — Other Ambulatory Visit: Payer: Self-pay

## 2021-06-07 DIAGNOSIS — N201 Calculus of ureter: Secondary | ICD-10-CM | POA: Insufficient documentation

## 2021-06-11 ENCOUNTER — Ambulatory Visit (INDEPENDENT_AMBULATORY_CARE_PROVIDER_SITE_OTHER): Payer: Medicare Other | Admitting: Urology

## 2021-06-11 ENCOUNTER — Encounter: Payer: Self-pay | Admitting: Urology

## 2021-06-11 ENCOUNTER — Other Ambulatory Visit: Payer: Self-pay

## 2021-06-11 VITALS — BP 150/87 | HR 121

## 2021-06-11 DIAGNOSIS — N201 Calculus of ureter: Secondary | ICD-10-CM

## 2021-06-11 LAB — URINALYSIS, ROUTINE W REFLEX MICROSCOPIC
Bilirubin, UA: NEGATIVE
Leukocytes,UA: NEGATIVE
Nitrite, UA: NEGATIVE
Specific Gravity, UA: >1.03 — ABNORMAL HIGH (ref 1.005–1.030)
Urobilinogen, Ur: 0.2 mg/dL (ref 0.2–1.0)
pH, UA: 5.5 (ref 5.0–7.5)

## 2021-06-11 LAB — MICROSCOPIC EXAMINATION: Renal Epithel, UA: NONE SEEN /hpf

## 2021-06-11 NOTE — Progress Notes (Signed)
06/11/2021 9:06 AM   Adrian Marshall [birth year] [id number]  Referring provider: Clinic, [facility] [place],  [place] [address]  No chief complaint on file.   HPI: Mr Seder is a 66yo here for followup for BPH and nephrolithiasis. KUB 2/26 showed no calculi. No stone events since last visit. IPSS 8 QOl 1. Nocturia 1x. Urine stream strong. Occasional urge incontinence, low volume   PMH: Past Medical History:  Diagnosis Date   Arthritis    NECK   BPH (benign prostatic hyperplasia)    Cancer ([facility])    SKIN CANCER-BASAL   Coronary artery disease    History of kidney stones    Hypercholesterolemia    Hypothyroidism    Renal disorder    kidney stones   Sleep apnea    USES CPAP   Thyroid disease     Surgical History: Past Surgical History:  Procedure Laterality Date   ANKLE SURGERY     CYSTOSCOPY N/A 03/04/2021   Procedure: CYSTOSCOPY;  Surgeon: Cleon Gustin, MD;  Location: [facility];  Service: Urology;  Laterality: N/A;   CYSTOSCOPY WITH RETROGRADE PYELOGRAM, URETEROSCOPY AND STENT PLACEMENT Left 08/16/2017   Procedure: CYSTOSCOPY WITH RETROGRADE PYELOGRAM, URETEROSCOPY AND STENT PLACEMENT;  Surgeon: Hollice Espy, MD;  Location: [facility];  Service: Urology;  Laterality: Left;   HERNIA REPAIR     KNEE SURGERY     NECK SURGERY     SHOULDER SURGERY     TRANSURETHRAL RESECTION OF PROSTATE N/A 07/20/2017   Procedure: TRANSURETHRAL RESECTION OF THE PROSTATE (TURP);  Surgeon: Abbie Sons, MD;  Location: [facility];  Service: Urology;  Laterality: N/A;   TRANSURETHRAL RESECTION OF PROSTATE N/A 03/04/2021   Procedure: TRANSURETHRAL RESECTION OF THE PROSTATE (TURP);  Surgeon: Cleon Gustin, MD;  Location: [facility];  Service: Urology;  Laterality: N/A;   URETEROSCOPY WITH HOLMIUM LASER LITHOTRIPSY Left 08/16/2017   Procedure: URETEROSCOPY WITH HOLMIUM LASER LITHOTRIPSY;  Surgeon: Hollice Espy, MD;  Location: [facility];   Service: Urology;  Laterality: Left;   VASECTOMY      Home Medications:  Allergies as of 06/11/2021       Reactions   Cortisone    inj to shoulder - paralysis   Celestone Soluspan [betamethasone]    Other reaction(s): Numbness   Neurontin [gabapentin]    Altered mental state   Other    Steroids - paralysis         Medication List        Accurate as of June 11, 2021  9:06 AM. If you have any questions, ask your nurse or doctor.          STOP taking these medications    Gemtesa 75 MG Tabs Generic drug: Vibegron Stopped by: Nicolette Bang, MD       TAKE these medications    amitriptyline 50 MG tablet Commonly known as: ELAVIL Take 50 mg by mouth at bedtime.   aspirin EC 81 MG tablet Take 81 mg by mouth daily.   atorvastatin 80 MG tablet Commonly known as: LIPITOR Take 80 mg by mouth every evening.   doxycycline 100 MG capsule Commonly known as: VIBRAMYCIN Take 1 capsule (100 mg total) by mouth every 12 (twelve) hours.   latanoprost 0.005 % ophthalmic solution Commonly known as: XALATAN Place 1 drop into both eyes at bedtime.   levothyroxine 88 MCG tablet Commonly known as: SYNTHROID Take 176 mcg by mouth daily before breakfast.   oxyCODONE-acetaminophen 5-325 MG tablet  Commonly known as: Percocet Take 1 tablet by mouth every 4 (four) hours as needed for severe pain.   phenazopyridine 200 MG tablet Commonly known as: Pyridium Take 1 tablet (200 mg total) by mouth 3 (three) times daily as needed for pain.   tamsulosin 0.4 MG Caps capsule Commonly known as: FLOMAX Take 0.8 mg by mouth daily after supper.   Urocit-K 10 10 MEQ (1080 MG) SR tablet Generic drug: potassium citrate Take 10 mEq by mouth 2 (two) times daily with a meal.   potassium citrate 10 MEQ (1080 MG) SR tablet Commonly known as: UROCIT-K Take 1 tablet by mouth 2 (two) times daily.        Allergies:  Allergies  Allergen Reactions   Cortisone     inj to shoulder -  paralysis   Celestone Soluspan [Betamethasone]     Other reaction(s): Numbness   Neurontin [Gabapentin]     Altered mental state   Other     Steroids - paralysis     Family History: Family History  Problem Relation Age of Onset   Prostate cancer Neg Hx    Bladder Cancer Neg Hx    Kidney cancer Neg Hx     Social History:  reports that he has never smoked. He has never used smokeless tobacco. He reports current alcohol use. He reports that he does not use drugs.  ROS: All other review of systems were reviewed and are negative except what is noted above in HPI  Physical Exam: BP (!) 150/87    Pulse (!) 121   Constitutional:  Alert and oriented, No acute distress. HEENT: [place] AT, moist mucus membranes.  Trachea midline, no masses. Cardiovascular: No clubbing, cyanosis, or edema. Respiratory: Normal respiratory effort, no increased work of breathing. GI: Abdomen is soft, nontender, nondistended, no abdominal masses GU: No CVA tenderness.  Lymph: No cervical or inguinal lymphadenopathy. Skin: No rashes, bruises or suspicious lesions. Neurologic: Grossly intact, no focal deficits, moving all 4 extremities. Psychiatric: Normal mood and affect.  Laboratory Data: Lab Results  Component Value Date   WBC 6.4 02/28/2021   HGB 15.1 02/28/2021   HCT 45.7 02/28/2021   MCV 93.5 02/28/2021   PLT 227 02/28/2021    Lab Results  Component Value Date   CREATININE 1.21 02/28/2021    Lab Results  Component Value Date   PSA 1.21 Test Methodology: Hybritech PSA 08/16/2008    No results found for: TESTOSTERONE  No results found for: HGBA1C  Urinalysis    Component Value Date/Time   COLORURINE AMBER (A) 01/31/2021 0923   APPEARANCEUR Clear 05/10/2021 1315   LABSPEC >1.030 (H) 01/31/2021 0923   PHURINE 5.5 01/31/2021 0923   GLUCOSEU Negative 05/10/2021 1315   HGBUR LARGE (A) 01/31/2021 0923   BILIRUBINUR Negative 05/10/2021 1315   KETONESUR 15 (A) 01/31/2021 0923   PROTEINUR  Negative 05/10/2021 1315   PROTEINUR 100 (A) 01/31/2021 0923   UROBILINOGEN 0.2 09/17/2019 0832   UROBILINOGEN 0.2 08/16/2008 1556   NITRITE Negative 05/10/2021 1315   NITRITE NEGATIVE 01/31/2021 0923   LEUKOCYTESUR 1+ (A) 05/10/2021 1315   LEUKOCYTESUR SMALL (A) 01/31/2021 0923    Lab Results  Component Value Date   LABMICR See below: 05/10/2021   WBCUA 6-10 (A) 05/10/2021   RBCUA 3-10 (A) 03/17/2018   LABEPIT 0-10 05/10/2021   MUCUS Present (A) 11/30/2018   BACTERIA None seen 05/10/2021    Pertinent Imaging: KUB 06/09/2021: Images reviewed and discussed with the patient Results for orders placed  during the hospital encounter of 06/07/21  Abdomen 1 view (KUB)  Narrative CLINICAL DATA:  Renal stone  EXAM: ABDOMEN - 1 VIEW  COMPARISON:  09/17/2019  FINDINGS: Bowel gas pattern is nonspecific. Moderate to large amount of stool is seen in the proximal colon without signs of fecal impaction in the rectosigmoid. Kidneys are mostly obscured by bowel contents limiting evaluation for small stones. As far as seen, no definite abnormal calcifications or mass lesions are seen.  IMPRESSION: Nonspecific bowel gas pattern. Moderate to large stool burden without signs of fecal impaction. Kidneys are mostly obscured by bowel contents limiting evaluation for renal stones.   Electronically Signed By: Elmer Picker M.D. On: 06/09/2021 14:35  No results found for this or any previous visit.  No results found for this or any previous visit.  No results found for this or any previous visit.  Results for orders placed during the hospital encounter of 08/21/17  US RENAL  Narrative CLINICAL DATA:  Left ureteral stone.  EXAM: RENAL / URINARY TRACT ULTRASOUND COMPLETE  COMPARISON:  None.  FINDINGS: Right Kidney:  Length: 10.4 cm. Echogenicity within normal limits. No mass or hydronephrosis visualized. 3 mm calculus within the lower pole of the right kidney,  nonobstructive.  Left Kidney:  Length: 11.7 cm. Echogenicity within normal limits. No mass or hydronephrosis visualized.  Bladder:  Appears normal for degree of bladder distention. Bilateral ureteral jets seen.  IMPRESSION: 3 mm nonobstructive right lower pole renal calculus, otherwise unremarkable.   Electronically Signed By: Fidela Salisbury M.D. On: 08/21/2017 23:18  No results found for this or any previous visit.  No results found for this or any previous visit.  Results for orders placed during the hospital encounter of 01/31/21  CT Renal Stone Study  Narrative CLINICAL DATA:  Flank pain, kidney stone suspected  EXAM: CT ABDOMEN AND PELVIS WITHOUT CONTRAST  TECHNIQUE: Multidetector CT imaging of the abdomen and pelvis was performed following the standard protocol without IV contrast.  COMPARISON:  03/29/2020  FINDINGS: Lower chest: No acute abnormality.  Hepatobiliary: Mildly decreased attenuation of the hepatic parenchyma compatible with hepatic steatosis. No focal liver lesion identified on unenhanced imaging. Unremarkable gallbladder. No hyperdense gallstone. No biliary dilatation.  Pancreas: Unremarkable. No pancreatic ductal dilatation or surrounding inflammatory changes.  Spleen: Normal in size without focal abnormality.  Adrenals/Urinary Tract: Unremarkable adrenal glands. Bilateral kidneys have an unremarkable unenhanced appearance. No renal stone or hydronephrosis. Bilateral ureters are nondilated. No ureteral calculi are seen. Urinary bladder is decompressed, limiting its evaluation. There appears to be bladder wall thickening and there is slight perivesicular fat stranding, which may represent cystitis.  Stomach/Bowel: Stomach within normal limits. 12 mm fat density lipoma is noted within the second portion of the duodenum (series 2, image 32). No dilated loops of bowel. Numerous colonic diverticula. No focal bowel wall thickening or  inflammatory changes.  Vascular/Lymphatic: Aortic atherosclerosis. No enlarged abdominal or pelvic lymph nodes.  Reproductive: Mild prostatomegaly. There are scattered dystrophic calcifications within the prostate. Faint tiny calcification within the prostate gland at the midline (series 2, image 88) which could potentially represent a calculus within the prostatic portion of the urethra given the patient's symptoms.  Other: No free fluid. No abdominopelvic fluid collection. No pneumoperitoneum. No abdominal wall hernia.  Musculoskeletal: No acute or significant osseous findings.  IMPRESSION: 1. Faint tiny calcification within the prostate gland at the midline which could potentially represent a calculus within the prostatic portion of the urethra given the patient's symptoms.  2. Urinary bladder is decompressed, limiting its evaluation. There appears to be bladder wall thickening and slight perivesicular fat stranding, which may represent cystitis. Correlation with urinalysis is recommended. 3. No renal or ureteral calculi.  No hydronephrosis. 4. Colonic diverticulosis without evidence of acute diverticulitis. 5. Hepatic steatosis.  Aortic Atherosclerosis (ICD10-I70.0).   Electronically Signed By: Davina Poke D.O. On: 01/31/2021 10:36   Assessment & Plan:    1. Ureteral calculus -RTC 1 year with KUB - Urinalysis, Routine w reflex microscopic  2. BPH with nocturia -   No follow-ups on file.  Nicolette Bang, MD  [facility] [place]

## 2021-06-11 NOTE — Patient Instructions (Signed)
Dietary Guidelines to Help Prevent Kidney Stones Kidney stones are deposits of minerals and salts that form inside your kidneys. Your risk of developing kidney stones may be greater depending on your diet, your lifestyle, the medicines you take, and whether you have certain medical conditions. Most people can lower their chances of developing kidney stones by following the instructions below. Your dietitian may give you more specific instructions depending on your overall health and the type of kidney stones you tend to develop. What are tips for following this plan? Reading food labels  Choose foods with "no salt added" or "low-salt" labels. Limit your salt (sodium) intake to less than 1,500 mg a day. Choose foods with calcium for each meal and snack. Try to eat about 300 mg of calcium at each meal. Foods that contain 200-500 mg of calcium a serving include: 8 oz (237 mL) of milk, calcium-fortifiednon-dairy milk, and calcium-fortifiedfruit juice. Calcium-fortified means that calcium has been added to these drinks. 8 oz (237 mL) of kefir, yogurt, and soy yogurt. 4 oz (114 g) of tofu. 1 oz (28 g) of cheese. 1 cup (150 g) of dried figs. 1 cup (91 g) of cooked broccoli. One 3 oz (85 g) can of sardines or mackerel. Most people need 1,000-1,500 mg of calcium a day. Talk to your dietitian about how much calcium is recommended for you. Shopping Buy plenty of fresh fruits and vegetables. Most people do not need to avoid fruits and vegetables, even if these foods contain nutrients that may contribute to kidney stones. When shopping for convenience foods, choose: Whole pieces of fruit. Pre-made salads with dressing on the side. Low-fat fruit and yogurt smoothies. Avoid buying frozen meals or prepared deli foods. These can be high in sodium. Look for foods with live cultures, such as yogurt and kefir. Choose high-fiber grains, such as whole-wheat breads, oat bran, and wheat cereals. Cooking Do not add  salt to food when cooking. Place a salt shaker on the table and allow each person to add his or her own salt to taste. Use vegetable protein, such as beans, textured vegetable protein (TVP), or tofu, instead of meat in pasta, casseroles, and soups. Meal planning Eat less salt, if told by your dietitian. To do this: Avoid eating processed or pre-made food. Avoid eating fast food. Eat less animal protein, including cheese, meat, poultry, or fish, if told by your dietitian. To do this: Limit the number of times you have meat, poultry, fish, or cheese each week. Eat a diet free of meat at least 2 days a week. Eat only one serving each day of meat, poultry, fish, or seafood. When you prepare animal protein, cut pieces into small portion sizes. For most meat and fish, one serving is about the size of the palm of your hand. Eat at least five servings of fresh fruits and vegetables each day. To do this: Keep fruits and vegetables on hand for snacks. Eat one piece of fruit or a handful of berries with breakfast. Have a salad and fruit at lunch. Have two kinds of vegetables at dinner. Limit foods that are high in a substance called oxalate. These include: Spinach (cooked), rhubarb, beets, sweet potatoes, and Swiss chard. Peanuts. Potato chips, french fries, and baked potatoes with skin on. Nuts and nut products. Chocolate. If you regularly take a diuretic medicine, make sure to eat at least 1 or 2 servings of fruits or vegetables that are high in potassium each day. These include: Avocado. Banana. Orange, prune,   carrot, or tomato juice. Baked potato. Cabbage. Beans and split peas. Lifestyle  Drink enough fluid to keep your urine pale yellow. This is the most important thing you can do. Spread your fluid intake throughout the day. If you drink alcohol: Limit how much you use to: 0-1 drink a day for women who are not pregnant. 0-2 drinks a day for men. Be aware of how much alcohol is in your  drink. In the [place] one drink equals one 12 oz bottle of beer (355 mL), one 5 oz glass of wine (148 mL), or one 1 oz glass of hard liquor (44 mL). Lose weight if told by your health care provider. Work with your dietitian to find an eating plan and weight loss strategies that work best for you. General information Talk to your health care provider and dietitian about taking daily supplements. You may be told the following depending on your health and the cause of your kidney stones: Not to take supplements with vitamin C. To take a calcium supplement. To take a daily probiotic supplement. To take other supplements such as magnesium, fish oil, or vitamin B6. Take over-the-counter and prescription medicines only as told by your health care provider. These include supplements. What foods should I limit? Limit your intake of the following foods, or eat them as told by your dietitian. Vegetables Spinach. Rhubarb. Beets. Canned vegetables. Pickles. Olives. Baked potatoes with skin. Grains Wheat bran. Baked goods. Salted crackers. Cereals high in sugar. Meats and other proteins Nuts. Nut butters. Large portions of meat, poultry, or fish. Salted, precooked, or cured meats, such as sausages, meat loaves, and hot dogs. Dairy Cheese. Beverages Regular soft drinks. Regular vegetable juice. Seasonings and condiments Seasoning blends with salt. Salad dressings. Soy sauce. Ketchup. Barbecue sauce. Other foods Canned soups. Canned pasta sauce. Casseroles. Pizza. Lasagna. Frozen meals. Potato chips. French fries. The items listed above may not be a complete list of foods and beverages you should limit. Contact a dietitian for more information. What foods should I avoid? Talk to your dietitian about specific foods you should avoid based on the type of kidney stones you have and your overall health. Fruits Grapefruit. The item listed above may not be a complete list of foods and beverages you should  avoid. Contact a dietitian for more information. Summary Kidney stones are deposits of minerals and salts that form inside your kidneys. You can lower your risk of kidney stones by making changes to your diet. The most important thing you can do is drink enough fluid. Drink enough fluid to keep your urine pale yellow. Talk to your dietitian about how much calcium you should have each day, and eat less salt and animal protein as told by your dietitian. This information is not intended to replace advice given to you by your health care provider. Make sure you discuss any questions you have with your health care provider. Document Revised: 03/24/2019 Document Reviewed: 03/24/2019 Elsevier Patient Education  2022 [employer]  

## 2022-01-13 ENCOUNTER — Other Ambulatory Visit (HOSPITAL_COMMUNITY): Payer: Self-pay | Admitting: Neurosurgery

## 2022-01-13 ENCOUNTER — Other Ambulatory Visit: Payer: Self-pay | Admitting: Neurosurgery

## 2022-01-13 DIAGNOSIS — M4722 Other spondylosis with radiculopathy, cervical region: Secondary | ICD-10-CM

## 2022-02-06 ENCOUNTER — Ambulatory Visit (HOSPITAL_COMMUNITY)
Admission: RE | Admit: 2022-02-06 | Discharge: 2022-02-06 | Disposition: A | Discharge status: Home / Self Care | Payer: Medicare Other | Source: Ambulatory Visit | Attending: Neurosurgery | Admitting: Neurosurgery

## 2022-02-06 DIAGNOSIS — M4722 Other spondylosis with radiculopathy, cervical region: Secondary | ICD-10-CM | POA: Diagnosis not present

## 2022-03-19 IMAGING — MR MR LUMBAR SPINE W/O CM
4 of 5 series · 15 of 48 positions shown · non-contrast
Comparison: None.

CLINICAL DATA: Back pain with leg weakness. Difficulty walking.
Lifting injury yesterday.

EXAM:
MRI LUMBAR SPINE WITHOUT CONTRAST
TECHNIQUE: Multiplanar, multisequence MR imaging of the lumbar spine was
performed. No intravenous contrast was administered.

[Series 3: T2 · sagittal · 4.0mm · 0.47mm/px · 4 of 13 slices shown (1 of 3)]
[im 1/13]
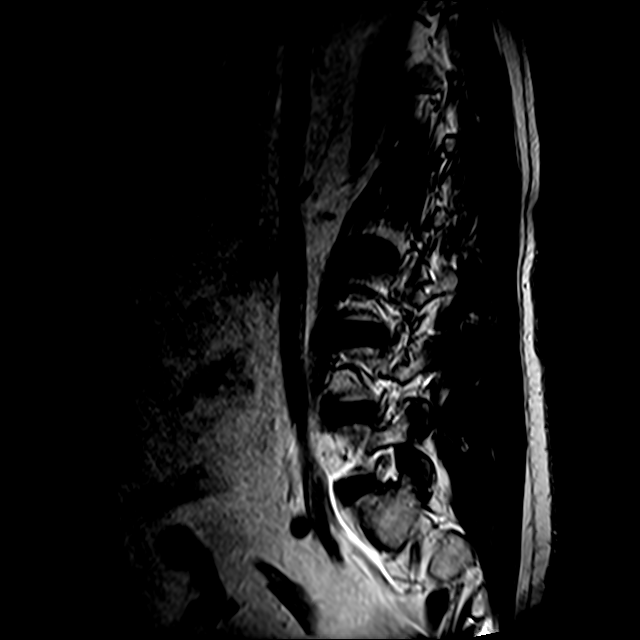
[im 5/13]
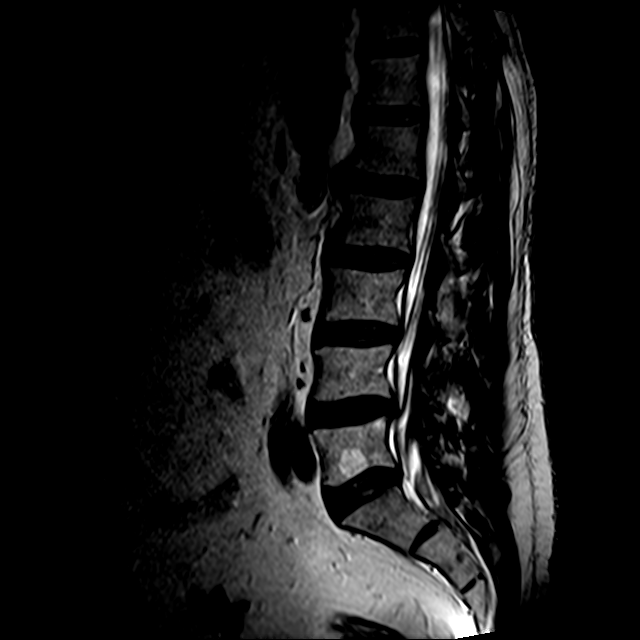
[im 9/13]
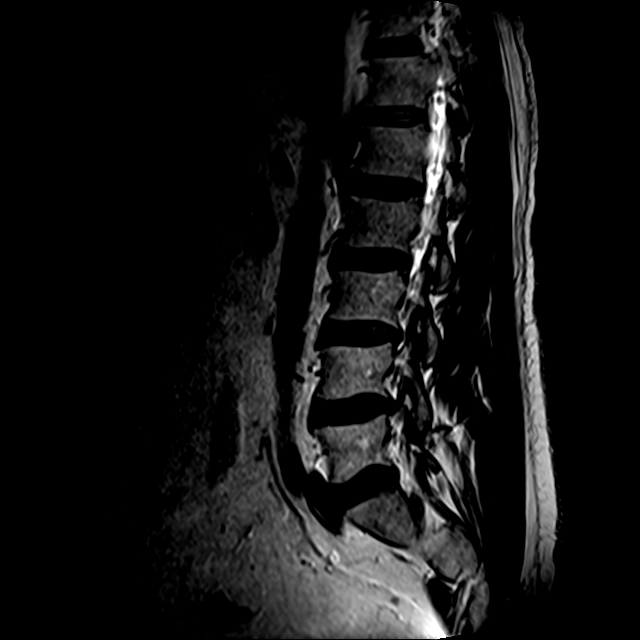
[im 13/13]
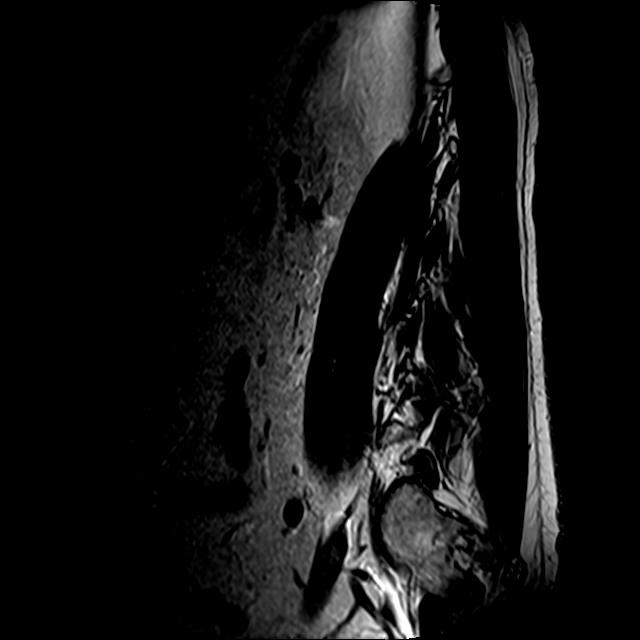

[Series 4: T1 · sagittal · 4.0mm · 0.59mm/px · 3 of 13 slices shown]
[im 1/13]
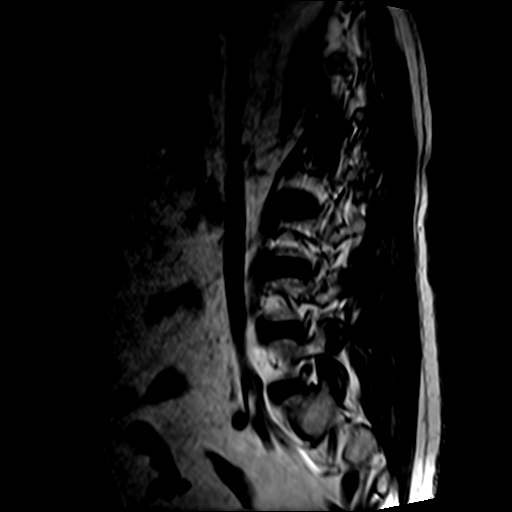
[im 7/13]
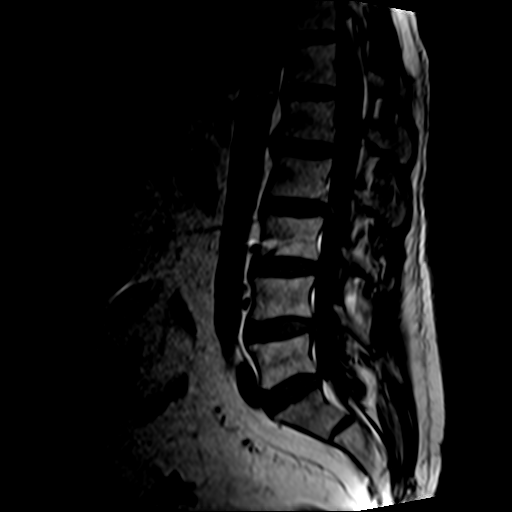
[im 13/13]
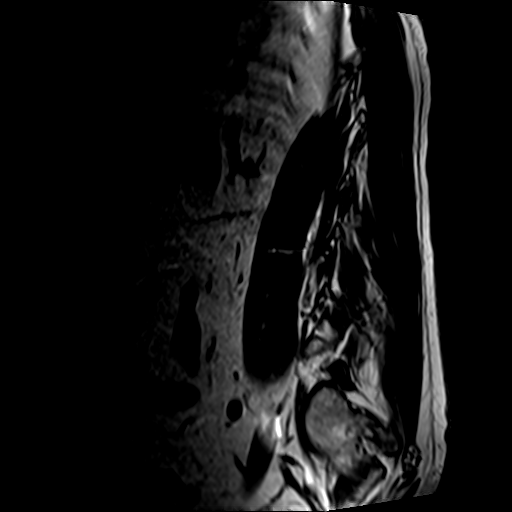

[Series 5: T2 · sagittal · 4.0mm · 0.47mm/px · 5 of 18 slices shown (2 of 3)]
[im 1/18]
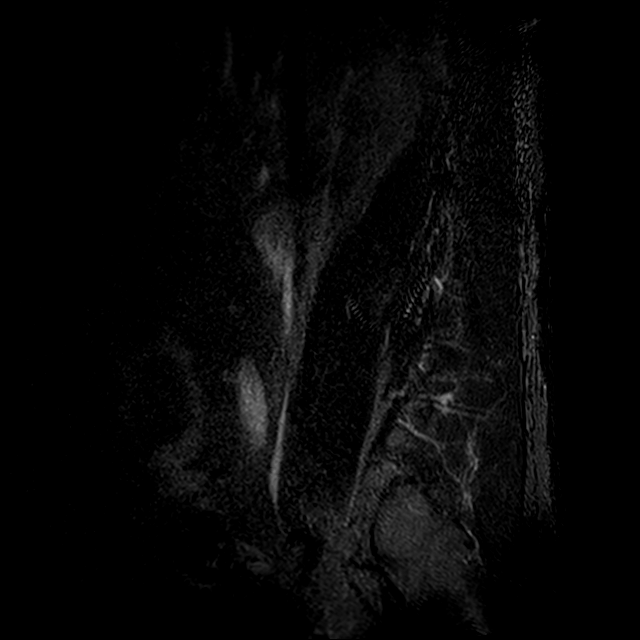
[im 3/18]
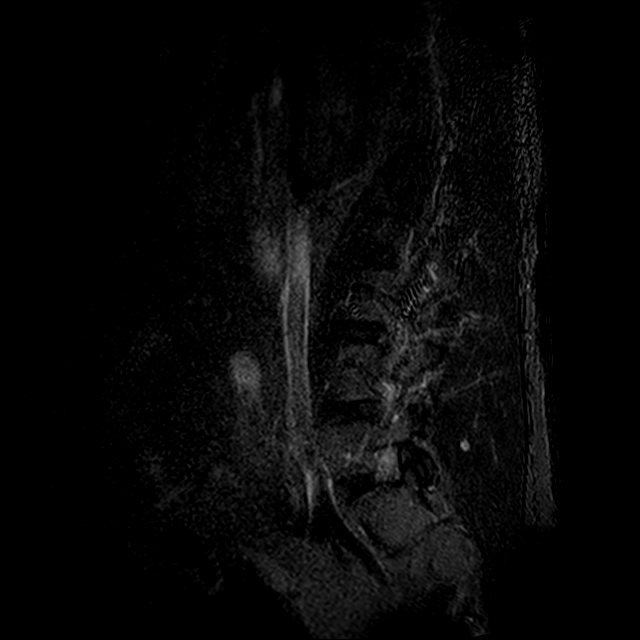
[im 6/18]
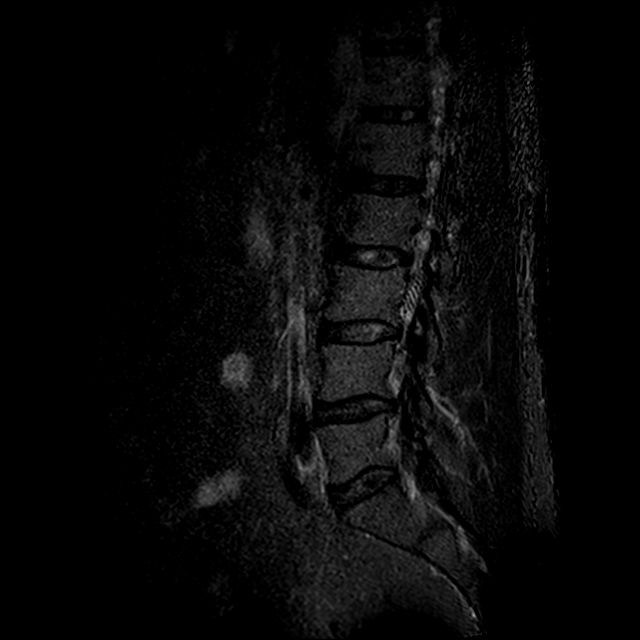
[im 9/18]
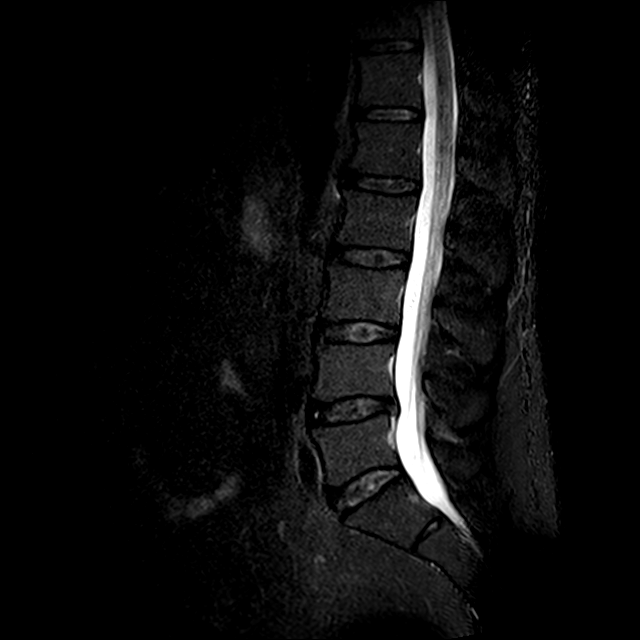
[im 15/18]
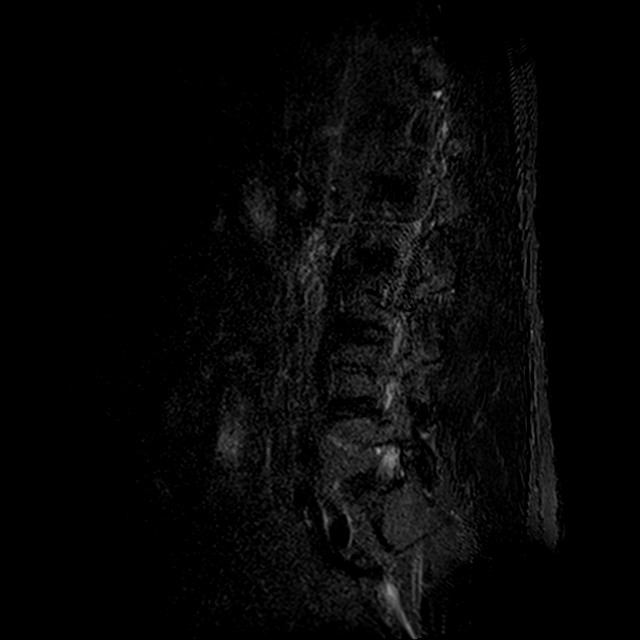

[Series 6: T2 · axial · 4.0mm · 0.33mm/px · z∈[-232,-70]mm · 3 of 44 slices shown (3 of 3)]
[im 6/44]
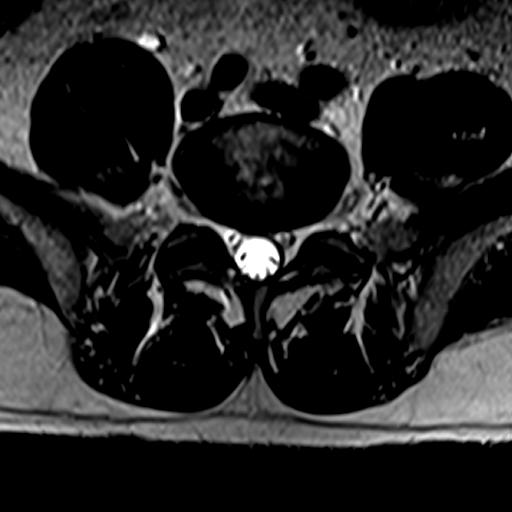
[im 23/44]
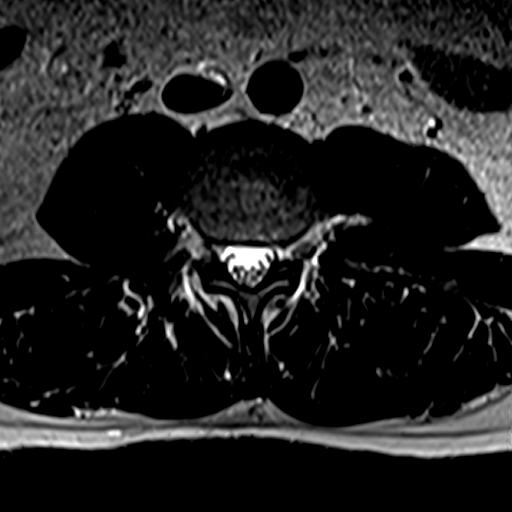
[im 38/44]
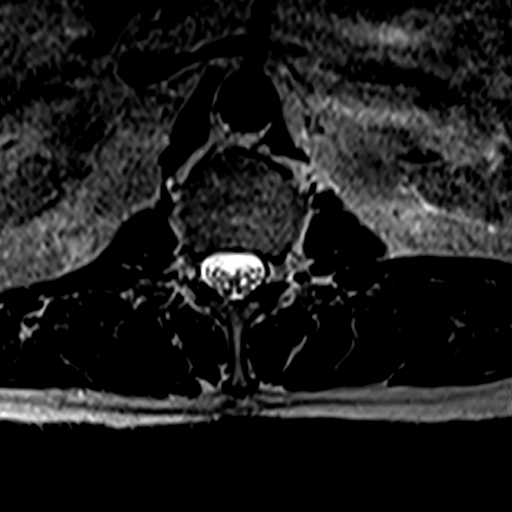

[15 of 48 positions shown; findings below may reference images not displayed]

FINDINGS: Segmentation:  5 lumbar type vertebral bodies.

Alignment:  Normal

Vertebrae:  No fracture or primary bone lesion.

Conus medullaris and cauda equina: Conus extends to the L1-2 level.
Conus and cauda equina appear normal.

Paraspinal and other soft tissues: Negative

Disc levels:

No abnormality at L3-4 or above.

At L4-5, there is a shallow disc protrusion that indents the thecal
sac slightly. There is mild narrowing of the lateral recesses but no
definite neural compression. This could be chronic or could have
been exacerbated recently.

L5-S1: Normal interspace.
IMPRESSION: L4-5: Shallow disc protrusion that indents the thecal sac slightly.
Mild stenosis of both lateral recesses but without definite neural
compression. This could be a chronic finding or could have been
exacerbated recently. Findings of this sort can be asymptomatic or
can be associated with back pain.

## 2022-04-18 ENCOUNTER — Ambulatory Visit (HOSPITAL_COMMUNITY): Payer: TRICARE For Life (TFL)

## 2022-05-12 ENCOUNTER — Ambulatory Visit (HOSPITAL_COMMUNITY): Payer: Medicare Other | Attending: Neurosurgery | Admitting: Physical Therapy

## 2022-05-12 ENCOUNTER — Encounter (HOSPITAL_COMMUNITY): Payer: Self-pay | Admitting: Physical Therapy

## 2022-05-12 ENCOUNTER — Other Ambulatory Visit: Payer: Self-pay

## 2022-05-12 DIAGNOSIS — R29898 Other symptoms and signs involving the musculoskeletal system: Secondary | ICD-10-CM | POA: Diagnosis present

## 2022-05-12 DIAGNOSIS — M542 Cervicalgia: Secondary | ICD-10-CM | POA: Diagnosis present

## 2022-05-12 DIAGNOSIS — M6281 Muscle weakness (generalized): Secondary | ICD-10-CM | POA: Insufficient documentation

## 2022-05-12 DIAGNOSIS — R2689 Other abnormalities of gait and mobility: Secondary | ICD-10-CM | POA: Insufficient documentation

## 2022-05-12 NOTE — Therapy (Signed)
OUTPATIENT PHYSICAL THERAPY CERVICAL EVALUATION   Patient Name: Adrian Marshall MRN: [id number] DOB:[birth year], 67 y.o., male Today's Date: 05/12/2022  END OF SESSION:  PT End of Session - 05/12/22 0948     Visit Number 1    Number of Visits 12    Date for PT Re-Evaluation 06/23/22    Authorization Type Primary [employer] Secondary Medicare    Progress Note Due on Visit 10    PT Start Time [phone]    PT Stop Time 1023    PT Time Calculation (min) 35 min    Activity Tolerance Patient tolerated treatment well    Behavior During Therapy WFL for tasks assessed/performed             Past Medical History:  Diagnosis Date   Arthritis    NECK   BPH (benign prostatic hyperplasia)    Cancer ([place])    SKIN CANCER-BASAL   Coronary artery disease    History of kidney stones    Hypercholesterolemia    Hypothyroidism    Renal disorder    kidney stones   Sleep apnea    USES CPAP   Thyroid disease    Past Surgical History:  Procedure Laterality Date   ANKLE SURGERY     CYSTOSCOPY N/A 03/04/2021   Procedure: CYSTOSCOPY;  Surgeon: Cleon Gustin, MD;  Location: [facility];  Service: Urology;  Laterality: N/A;   CYSTOSCOPY WITH RETROGRADE PYELOGRAM, URETEROSCOPY AND STENT PLACEMENT Left 08/16/2017   Procedure: CYSTOSCOPY WITH RETROGRADE PYELOGRAM, URETEROSCOPY AND STENT PLACEMENT;  Surgeon: Hollice Espy, MD;  Location: [facility];  Service: Urology;  Laterality: Left;   HERNIA REPAIR     KNEE SURGERY     NECK SURGERY     SHOULDER SURGERY     TRANSURETHRAL RESECTION OF PROSTATE N/A 07/20/2017   Procedure: TRANSURETHRAL RESECTION OF THE PROSTATE (TURP);  Surgeon: Abbie Sons, MD;  Location: [facility];  Service: Urology;  Laterality: N/A;   TRANSURETHRAL RESECTION OF PROSTATE N/A 03/04/2021   Procedure: TRANSURETHRAL RESECTION OF THE PROSTATE (TURP);  Surgeon: Cleon Gustin, MD;  Location: [facility];  Service: Urology;  Laterality: N/A;   URETEROSCOPY WITH HOLMIUM LASER  LITHOTRIPSY Left 08/16/2017   Procedure: URETEROSCOPY WITH HOLMIUM LASER LITHOTRIPSY;  Surgeon: Hollice Espy, MD;  Location: [facility];  Service: Urology;  Laterality: Left;   VASECTOMY     Patient Active Problem List   Diagnosis Date Noted   Sleep apnea 04/02/2018   Ureteral calculus 08/15/2017   S/P TURP 07/20/2017   Nephrolithiasis 06/17/2017    PCP: [facility]  REFERRING PROVIDER: Consuella Lose, MD  REFERRING DIAG: cervical spondylosis with radiculopathy  THERAPY DIAG:  Cervicalgia  Muscle weakness (generalized)  Other abnormalities of gait and mobility  Other symptoms and signs involving the musculoskeletal system  Rationale for Evaluation and Treatment: Rehabilitation  ONSET DATE: 30 years; L UE issues for last year  SUBJECTIVE:  SUBJECTIVE STATEMENT: Neck pain began 30 years ago. ACDF 10 years ago. The area above that one has some issues. Very similar issues to before. Symptoms into bicep, back of forearm, back of hands. Symptoms bilateral L>R. Took Lyrica but that didn't help. On antidepressant for nerve pain but not consistent with taking it.   PERTINENT HISTORY:  CAD, HLD, hx neck surgery  PAIN:  Are you having pain? Yes: NPRS scale: 5/10 Pain location: neck and LUE Pain description: dull, slow Aggravating factors: constant Relieving factors: none  PRECAUTIONS: None  WEIGHT BEARING RESTRICTIONS: No  FALLS:  Has patient fallen in last 6 months? No  LIVING ENVIRONMENT: Lives with: lives with their spouse Lives in: House/apartment Stairs: Yes: External: 2 steps; none Has following equipment at home: Single point cane  OCCUPATION: Retired  PLOF: Independent  PATIENT GOALS: strength in arm and grip  NEXT MD VISIT: none  OBJECTIVE:    DIAGNOSTIC FINDINGS:  MRI 02/06/22 IMPRESSION: 1. At C5-C6, moderate bilateral foraminal stenosis and mild canal stenosis. 2. C6-C7 ACDF with patent canal and foramina at this level.  PATIENT SURVEYS: FOTO 48% function  COGNITION: Overall cognitive status: Within functional limits for tasks assessed  SENSATION: Light touch: Impaired decreased grossly L UE  POSTURE: rounded shoulders, forward head, decreased lumbar lordosis, decreased thoracic kyphosis, and kyphosis at upper t/sp  PALPATION: Grossly TTP cervical paraspinals, bilateral UT; Hypomobile cervical spine UPAs   CERVICAL ROM: LUE symptoms worse with extension, L lateral flexion, L rotation  Active ROM A/PROM  eval  Flexion 25% limited   Extension 90 % limited  Right lateral flexion 50% limited  Left lateral flexion 50% limited  Right rotation 25% limited  Left rotation 50% limited *   (Blank rows = not tested) *=pain  UPPER EXTREMITY ROM: WFL for tasks assessed, full functional ROM  Active ROM Right eval Left eval  Shoulder flexion    Shoulder extension    Shoulder abduction    Shoulder adduction    Shoulder extension    Shoulder internal rotation    Shoulder external rotation    Elbow flexion    Elbow extension    Wrist flexion    Wrist extension    Wrist ulnar deviation    Wrist radial deviation    Wrist pronation    Wrist supination     (Blank rows = not tested)  UPPER EXTREMITY MMT:  MMT Right eval Left eval  Shoulder flexion 5 4  Shoulder extension    Shoulder abduction 5 4  Shoulder adduction    Shoulder extension    Shoulder internal rotation 5 4  Shoulder external rotation 5 4-  Middle trapezius    Lower trapezius    Elbow flexion 5 4  Elbow extension 5 4  Wrist flexion 5 4  Wrist extension 5 4  Wrist ulnar deviation    Wrist radial deviation    Wrist pronation    Wrist supination    Grip strength     (Blank rows = not tested)  CERVICAL SPECIAL TESTS:  Distraction  test: Positive    TODAY'S TREATMENT:  DATE:  05/12/22 Supine cervical retractions 2 x 10  Supine scap retractions 2 x 10    PATIENT EDUCATION:  Education details: Patient educated on exam findings, POC, scope of PT, HEP, and posture. Person educated: Patient Education method: Explanation, Demonstration, and Handouts Education comprehension: verbalized understanding, returned demonstration, verbal cues required, and tactile cues required  HOME EXERCISE PROGRAM: Access Code: [email or web address] URL: [email or web address] Date: 05/12/2022 - Supine Chin Tuck  - 3 x daily - 7 x weekly - 2 sets - 10 reps - Supine Scapular Retraction  - 3 x daily - 7 x weekly - 2 sets - 10 reps  ASSESSMENT:  CLINICAL IMPRESSION: Patient a 67 y.o. y.o. male who was seen today for physical therapy evaluation and treatment for cervical spondylosis with radiculopathy. Patient presents with pain limited deficits in cervical spine and UE strength, ROM, endurance, activity tolerance, posture, and functional mobility with ADL. Patient is having to modify and restrict ADL as indicated by outcome measure score as well as subjective information and objective measures which is affecting overall participation. Patient will benefit from skilled physical therapy in order to improve function and reduce impairment.  OBJECTIVE IMPAIRMENTS: decreased activity tolerance, decreased mobility, decreased ROM, decreased strength, hypomobility, increased muscle spasms, impaired flexibility, improper body mechanics, postural dysfunction, and pain.   ACTIVITY LIMITATIONS: carrying, lifting, bending, reach over head, and caring for others  PARTICIPATION LIMITATIONS: meal prep, cleaning, laundry, shopping, community activity, and yard work  PERSONAL FACTORS: Time since onset of injury/illness/exacerbation and  1-2 comorbidities: CAD, HLD, hx neck surgery  are also affecting patient's functional outcome.   REHAB POTENTIAL: Good  CLINICAL DECISION MAKING: Stable/uncomplicated  EVALUATION COMPLEXITY: Low   GOALS: Goals reviewed with patient? Yes  SHORT TERM GOALS: Target date: 06/02/2022    Patient will be independent with HEP in order to improve functional outcomes. Baseline: Goal status: INITIAL  2.  Patient will report at least 25% improvement in symptoms for improved quality of life. Baseline:  Goal status: INITIAL    LONG TERM GOALS: Target date: 06/23/2022    Patient will report at least 75% improvement in symptoms for improved quality of life. Baseline:  Goal status: INITIAL  2.  Patient will improve FOTO score by at least 7 points in order to indicate improved tolerance to activity. Baseline: 48% function Goal status: INITIAL  3.  Patient will demonstrate at least 25% improvement in cervical ROM in all restricted planes for improved ability to move head while completing chores. Baseline: see above Goal status: INITIAL  4.  Patient will demonstrate grade of 5/5 MMT grade in all tested musculature as evidence of improved strength to assist with lifting at home. Baseline: see above Goal status: INITIAL    PLAN:  PT FREQUENCY: 2x/week  PT DURATION: 6 weeks  PLANNED INTERVENTIONS: Therapeutic exercises, Therapeutic activity, Neuromuscular re-education, Balance training, Gait training, Patient/Family education, Joint manipulation, Joint mobilization, Stair training, Orthotic/Fit training, DME instructions, Aquatic Therapy, Dry Needling, Electrical stimulation, Spinal manipulation, Spinal mobilization, Cryotherapy, Moist heat, Compression bandaging, scar mobilization, Splintting, Taping, Traction, Ultrasound, Ionotophoresis '4mg'$ /ml Dexamethasone, and Manual therapy  PLAN FOR NEXT SESSION: f/u with HEP, cervical mobility with exercise/manual if needed, postural strength, LUE  strength   Vianne Bulls Myriam Brandhorst, PT 05/12/2022, 10:29 AM

## 2022-05-15 ENCOUNTER — Ambulatory Visit (HOSPITAL_COMMUNITY): Payer: Medicare Other | Attending: Neurosurgery

## 2022-05-15 DIAGNOSIS — R2689 Other abnormalities of gait and mobility: Secondary | ICD-10-CM | POA: Diagnosis present

## 2022-05-15 DIAGNOSIS — M542 Cervicalgia: Secondary | ICD-10-CM | POA: Insufficient documentation

## 2022-05-15 DIAGNOSIS — R29898 Other symptoms and signs involving the musculoskeletal system: Secondary | ICD-10-CM | POA: Diagnosis present

## 2022-05-15 DIAGNOSIS — M6281 Muscle weakness (generalized): Secondary | ICD-10-CM | POA: Insufficient documentation

## 2022-05-15 NOTE — Therapy (Signed)
OUTPATIENT PHYSICAL THERAPY CERVICAL TREATMENT   Patient Name: Adrian Marshall MRN: [id number] DOB:1955/12/27, 67 y.o., male Today's Date: 05/15/2022  END OF SESSION:  PT End of Session - 05/15/22 1646     Visit Number 2    Number of Visits 12    Date for PT Re-Evaluation 06/23/22    Authorization Type Primary [employer] Secondary Medicare    Progress Note Due on Visit 10    PT Start Time 1520    PT Stop Time 1600    PT Time Calculation (min) 40 min    Activity Tolerance No increased pain              Past Medical History:  Diagnosis Date   Arthritis    NECK   BPH (benign prostatic hyperplasia)    Cancer ([place])    SKIN CANCER-BASAL   Coronary artery disease    History of kidney stones    Hypercholesterolemia    Hypothyroidism    Renal disorder    kidney stones   Sleep apnea    USES CPAP   Thyroid disease    Past Surgical History:  Procedure Laterality Date   ANKLE SURGERY     CYSTOSCOPY N/A 03/04/2021   Procedure: CYSTOSCOPY;  Surgeon: Cleon Gustin, MD;  Location: [facility];  Service: Urology;  Laterality: N/A;   CYSTOSCOPY WITH RETROGRADE PYELOGRAM, URETEROSCOPY AND STENT PLACEMENT Left 08/16/2017   Procedure: CYSTOSCOPY WITH RETROGRADE PYELOGRAM, URETEROSCOPY AND STENT PLACEMENT;  Surgeon: Hollice Espy, MD;  Location: [facility];  Service: Urology;  Laterality: Left;   HERNIA REPAIR     KNEE SURGERY     NECK SURGERY     SHOULDER SURGERY     TRANSURETHRAL RESECTION OF PROSTATE N/A 07/20/2017   Procedure: TRANSURETHRAL RESECTION OF THE PROSTATE (TURP);  Surgeon: Abbie Sons, MD;  Location: [facility];  Service: Urology;  Laterality: N/A;   TRANSURETHRAL RESECTION OF PROSTATE N/A 03/04/2021   Procedure: TRANSURETHRAL RESECTION OF THE PROSTATE (TURP);  Surgeon: Cleon Gustin, MD;  Location: [facility];  Service: Urology;  Laterality: N/A;   URETEROSCOPY WITH HOLMIUM LASER LITHOTRIPSY Left 08/16/2017   Procedure: URETEROSCOPY WITH HOLMIUM LASER  LITHOTRIPSY;  Surgeon: Hollice Espy, MD;  Location: [facility];  Service: Urology;  Laterality: Left;   VASECTOMY     Patient Active Problem List   Diagnosis Date Noted   Sleep apnea 04/02/2018   Ureteral calculus 08/15/2017   S/P TURP 07/20/2017   Nephrolithiasis 06/17/2017    [facility]  REFERRING PROVIDER: Consuella Lose, MD  REFERRING DIAG: cervical spondylosis with radiculopathy  THERAPY DIAG:  Cervicalgia  Muscle weakness (generalized)  Rationale for Evaluation and Treatment: Rehabilitation  ONSET DATE: 30 years; L UE issues for last year  SUBJECTIVE:  SUBJECTIVE STATEMENT: Patient states that he's hurting at the moment (5/10 on NPRS). Pain goes all the way down to the L biceps, forearm and back of the fingers. The R side is also experiencing the same but the intensity is less. Patient states that his L grip is getting weak.  EVALUATION: Neck pain began 30 years ago. ACDF 10 years ago. The area above that one has some issues. Very similar issues to before. Symptoms into bicep, back of forearm, back of hands. Symptoms bilateral L>R. Took Lyrica but that didn't help. On antidepressant for nerve pain but not consistent with taking it.   PERTINENT HISTORY:  CAD, HLD, hx neck surgery  PAIN:  Are you having pain? Yes: NPRS scale: 5/10 Pain location: neck and LUE Pain description: dull, slow Aggravating factors: constant Relieving factors: none  PRECAUTIONS: None  WEIGHT BEARING RESTRICTIONS: No  FALLS:  Has patient fallen in last 6 months? No  LIVING ENVIRONMENT: Lives with: lives with their spouse Lives in: House/apartment Stairs: Yes: External: 2 steps; none Has following equipment at home: Single point cane  OCCUPATION: Retired  PLOF:  Independent  PATIENT GOALS: strength in arm and grip  NEXT MD VISIT: none  OBJECTIVE:   DIAGNOSTIC FINDINGS:  MRI 02/06/22 IMPRESSION: 1. At C5-C6, moderate bilateral foraminal stenosis and mild canal stenosis. 2. C6-C7 ACDF with patent canal and foramina at this level.  PATIENT SURVEYS: FOTO 48% function  COGNITION: Overall cognitive status: Within functional limits for tasks assessed  SENSATION: Light touch: Impaired decreased grossly L UE  POSTURE: rounded shoulders, forward head, decreased lumbar lordosis, decreased thoracic kyphosis, and kyphosis at upper t/sp  PALPATION: Grossly TTP cervical paraspinals, bilateral UT; Hypomobile cervical spine UPAs   CERVICAL ROM: LUE symptoms worse with extension, L lateral flexion, L rotation  Active ROM A/PROM  eval  Flexion 25% limited   Extension 90 % limited  Right lateral flexion 50% limited  Left lateral flexion 50% limited  Right rotation 25% limited  Left rotation 50% limited *   (Blank rows = not tested) *=pain  UPPER EXTREMITY ROM: WFL for tasks assessed, full functional ROM  Active ROM Right eval Left eval  Shoulder flexion    Shoulder extension    Shoulder abduction    Shoulder adduction    Shoulder extension    Shoulder internal rotation    Shoulder external rotation    Elbow flexion    Elbow extension    Wrist flexion    Wrist extension    Wrist ulnar deviation    Wrist radial deviation    Wrist pronation    Wrist supination     (Blank rows = not tested)  UPPER EXTREMITY MMT:  MMT Right eval Left eval  Shoulder flexion 5 4  Shoulder extension    Shoulder abduction 5 4  Shoulder adduction    Shoulder extension    Shoulder internal rotation 5 4  Shoulder external rotation 5 4-  Middle trapezius    Lower trapezius    Elbow flexion 5 4  Elbow extension 5 4  Wrist flexion 5 4  Wrist extension 5 4  Wrist ulnar deviation    Wrist radial deviation    Wrist pronation    Wrist supination     Grip strength     (Blank rows = not tested)  CERVICAL SPECIAL TESTS:  Distraction test: Positive    TODAY'S TREATMENT:  DATE:  05/15/22 ULTT assessment Seated:  Upper trapezius stretch x 30" x 3  L median nerve floss x 1' Standing:  L radial nerve floss x 1'  Pectoralis stretch (T position) x 30" x 3  I' and T's RTB x 10 x 2 Supine:  STM on paracervicals and upper trapezius x 8'  Cervical retractions 3"x 2 x 10   Cervical rotation x 10 on each side 05/12/22 Supine cervical retractions 2 x 10  Supine scap retractions 2 x 10    PATIENT EDUCATION:  Education details: Updated HEP Person educated: Patient Education method: Explanation, Demonstration, and Handouts Education comprehension: verbalized understanding, returned demonstration, verbal cues required, and tactile cues required  HOME EXERCISE PROGRAM: Access Code: [email or web address] URL: [email or web address]  Date: 05/15/2022 - Seated Upper Trapezius Stretch  - 1-2 x daily - 5-7 x weekly - 3 reps - 30 hold - Median Nerve Flossing - Tray  - 1-2 x daily - 5-7 x weekly - Standing Radial Nerve Glide  - 1-2 x daily - 5-7 x weekly - Supine Cervical Rotation AROM on Pillow  - 1-2 x daily - 5-7 x weekly - 2 sets - 10 reps - Doorway Pec Stretch at 90 Degrees Abduction  - 1-2 x daily - 5-7 x weekly - 3 sets - 3 reps - 30 hold  Date: 05/12/2022 - Supine Chin Tuck  - 3 x daily - 7 x weekly - 2 sets - 10 reps - Supine Scapular Retraction  - 3 x daily - 7 x weekly - 2 sets - 10 reps  ASSESSMENT:  CLINICAL IMPRESSION: Patient is positive for L ULTT 1 (median) and L ULTT 3 (radial). Tolerated nerve floss and all other activities without worsening of symptoms. Reported slight decrease in pain with soft tissue mobilization. Demonstrated appropriate levels of fatigue. Required moderate amount of cueing to  ensure correct execution of activity especially with the nerve floss. To date, skilled PT is required to address the impairments and improve function.  EVALUATION: Patient a 67 y.o. y.o. male who was seen today for physical therapy evaluation and treatment for cervical spondylosis with radiculopathy. Patient presents with pain limited deficits in cervical spine and UE strength, ROM, endurance, activity tolerance, posture, and functional mobility with ADL. Patient is having to modify and restrict ADL as indicated by outcome measure score as well as subjective information and objective measures which is affecting overall participation. Patient will benefit from skilled physical therapy in order to improve function and reduce impairment.  OBJECTIVE IMPAIRMENTS: decreased activity tolerance, decreased mobility, decreased ROM, decreased strength, hypomobility, increased muscle spasms, impaired flexibility, improper body mechanics, postural dysfunction, and pain.   ACTIVITY LIMITATIONS: carrying, lifting, bending, reach over head, and caring for others  PARTICIPATION LIMITATIONS: meal prep, cleaning, laundry, shopping, community activity, and yard work  PERSONAL FACTORS: Time since onset of injury/illness/exacerbation and 1-2 comorbidities: CAD, HLD, hx neck surgery  are also affecting patient's functional outcome.   REHAB POTENTIAL: Good  CLINICAL DECISION MAKING: Stable/uncomplicated  EVALUATION COMPLEXITY: Low   GOALS: Goals reviewed with patient? Yes  SHORT TERM GOALS: Target date: 06/02/2022    Patient will be independent with HEP in order to improve functional outcomes. Baseline: Goal status: INITIAL  2.  Patient will report at least 25% improvement in symptoms for improved quality of life. Baseline:  Goal status: INITIAL    LONG TERM GOALS: Target date: 06/23/2022    Patient will report at least 75% improvement in symptoms for improved quality of life.  Baseline:  Goal status:  INITIAL  2.  Patient will improve FOTO score by at least 7 points in order to indicate improved tolerance to activity. Baseline: 48% function Goal status: INITIAL  3.  Patient will demonstrate at least 25% improvement in cervical ROM in all restricted planes for improved ability to move head while completing chores. Baseline: see above Goal status: INITIAL  4.  Patient will demonstrate grade of 5/5 MMT grade in all tested musculature as evidence of improved strength to assist with lifting at home. Baseline: see above Goal status: INITIAL    PLAN:  PT FREQUENCY: 2x/week  PT DURATION: 6 weeks  PLANNED INTERVENTIONS: Therapeutic exercises, Therapeutic activity, Neuromuscular re-education, Balance training, Gait training, Patient/Family education, Joint manipulation, Joint mobilization, Stair training, Orthotic/Fit training, DME instructions, Aquatic Therapy, Dry Needling, Electrical stimulation, Spinal manipulation, Spinal mobilization, Cryotherapy, Moist heat, Compression bandaging, scar mobilization, Splintting, Taping, Traction, Ultrasound, Ionotophoresis '4mg'$ /ml Dexamethasone, and Manual therapy  PLAN FOR NEXT SESSION: Continue POC and may progress as tolerated with emphasis on cervical stability, neural mobility, and upper back strength. May add grip strengthening activities.   Harvie Heck. Derinda Bartus, PT, DPT, OCS Board-Certified Clinical Specialist in [place] # ([place]): [id number] T 05/15/2022, 4:48 PM

## 2022-05-21 ENCOUNTER — Ambulatory Visit (HOSPITAL_COMMUNITY): Payer: Medicare Other | Admitting: Physical Therapy

## 2022-05-21 ENCOUNTER — Encounter (HOSPITAL_COMMUNITY): Payer: Self-pay | Admitting: Physical Therapy

## 2022-05-21 DIAGNOSIS — M542 Cervicalgia: Secondary | ICD-10-CM | POA: Diagnosis not present

## 2022-05-21 DIAGNOSIS — R29898 Other symptoms and signs involving the musculoskeletal system: Secondary | ICD-10-CM

## 2022-05-21 DIAGNOSIS — M6281 Muscle weakness (generalized): Secondary | ICD-10-CM

## 2022-05-21 DIAGNOSIS — R2689 Other abnormalities of gait and mobility: Secondary | ICD-10-CM

## 2022-05-21 NOTE — Therapy (Signed)
OUTPATIENT PHYSICAL THERAPY CERVICAL TREATMENT   Patient Name: Adrian Marshall MRN: [id number] DOB:07-19-1955, 67 y.o., male Today's Date: 05/21/2022  END OF SESSION:  PT End of Session - 05/21/22 0732     Visit Number 3    Number of Visits 12    Date for PT Re-Evaluation 06/23/22    Authorization Type Primary [employer] Secondary Medicare    Progress Note Due on Visit 10    PT Start Time 0733    PT Stop Time 0813    PT Time Calculation (min) 40 min    Activity Tolerance No increased pain    Behavior During Therapy WFL for tasks assessed/performed              Past Medical History:  Diagnosis Date   Arthritis    NECK   BPH (benign prostatic hyperplasia)    Cancer ([place])    SKIN CANCER-BASAL   Coronary artery disease    History of kidney stones    Hypercholesterolemia    Hypothyroidism    Renal disorder    kidney stones   Sleep apnea    USES CPAP   Thyroid disease    Past Surgical History:  Procedure Laterality Date   ANKLE SURGERY     CYSTOSCOPY N/A 03/04/2021   Procedure: CYSTOSCOPY;  Surgeon: Cleon Gustin, MD;  Location: [facility];  Service: Urology;  Laterality: N/A;   CYSTOSCOPY WITH RETROGRADE PYELOGRAM, URETEROSCOPY AND STENT PLACEMENT Left 08/16/2017   Procedure: CYSTOSCOPY WITH RETROGRADE PYELOGRAM, URETEROSCOPY AND STENT PLACEMENT;  Surgeon: Hollice Espy, MD;  Location: [facility];  Service: Urology;  Laterality: Left;   HERNIA REPAIR     KNEE SURGERY     NECK SURGERY     SHOULDER SURGERY     TRANSURETHRAL RESECTION OF PROSTATE N/A 07/20/2017   Procedure: TRANSURETHRAL RESECTION OF THE PROSTATE (TURP);  Surgeon: Abbie Sons, MD;  Location: [facility];  Service: Urology;  Laterality: N/A;   TRANSURETHRAL RESECTION OF PROSTATE N/A 03/04/2021   Procedure: TRANSURETHRAL RESECTION OF THE PROSTATE (TURP);  Surgeon: Cleon Gustin, MD;  Location: [facility];  Service: Urology;  Laterality: N/A;   URETEROSCOPY WITH HOLMIUM LASER LITHOTRIPSY Left  08/16/2017   Procedure: URETEROSCOPY WITH HOLMIUM LASER LITHOTRIPSY;  Surgeon: Hollice Espy, MD;  Location: [facility];  Service: Urology;  Laterality: Left;   VASECTOMY     Patient Active Problem List   Diagnosis Date Noted   Sleep apnea 04/02/2018   Ureteral calculus 08/15/2017   S/P TURP 07/20/2017   Nephrolithiasis 06/17/2017    [facility]  REFERRING PROVIDER: Consuella Lose, MD  REFERRING DIAG: cervical spondylosis with radiculopathy  THERAPY DIAG:  No diagnosis found.  Rationale for Evaluation and Treatment: Rehabilitation  ONSET DATE: 30 years; L UE issues for last year  SUBJECTIVE:  SUBJECTIVE STATEMENT: Patient states no change in symptoms. Sometimes makes things more sore.   PERTINENT HISTORY:  CAD, HLD, hx neck surgery  PAIN:  Are you having pain? Yes: NPRS scale: 4-5/10 Pain location: neck and LUE Pain description: dull, slow Aggravating factors: constant Relieving factors: none  PRECAUTIONS: None  WEIGHT BEARING RESTRICTIONS: No  FALLS:  Has patient fallen in last 6 months? No  LIVING ENVIRONMENT: Lives with: lives with their spouse Lives in: House/apartment Stairs: Yes: External: 2 steps; none Has following equipment at home: Single point cane  OCCUPATION: Retired  PLOF: Independent  PATIENT GOALS: strength in arm and grip  NEXT MD VISIT: none  OBJECTIVE:   DIAGNOSTIC FINDINGS:  MRI 02/06/22 IMPRESSION: 1. At C5-C6, moderate bilateral foraminal stenosis and mild canal stenosis. 2. C6-C7 ACDF with patent canal and foramina at this level.  PATIENT SURVEYS: FOTO 48% function  COGNITION: Overall cognitive status: Within functional limits for tasks assessed  SENSATION: Light touch: Impaired decreased grossly L UE  POSTURE:  rounded shoulders, forward head, decreased lumbar lordosis, decreased thoracic kyphosis, and kyphosis at upper t/sp  PALPATION: Grossly TTP cervical paraspinals, bilateral UT; Hypomobile cervical spine UPAs   CERVICAL ROM: LUE symptoms worse with extension, L lateral flexion, L rotation  Active ROM A/PROM  eval  Flexion 25% limited   Extension 90 % limited  Right lateral flexion 50% limited  Left lateral flexion 50% limited  Right rotation 25% limited  Left rotation 50% limited *   (Blank rows = not tested) *=pain  UPPER EXTREMITY ROM: WFL for tasks assessed, full functional ROM  Active ROM Right eval Left eval  Shoulder flexion    Shoulder extension    Shoulder abduction    Shoulder adduction    Shoulder extension    Shoulder internal rotation    Shoulder external rotation    Elbow flexion    Elbow extension    Wrist flexion    Wrist extension    Wrist ulnar deviation    Wrist radial deviation    Wrist pronation    Wrist supination     (Blank rows = not tested)  UPPER EXTREMITY MMT:  MMT Right eval Left eval  Shoulder flexion 5 4  Shoulder extension    Shoulder abduction 5 4  Shoulder adduction    Shoulder extension    Shoulder internal rotation 5 4  Shoulder external rotation 5 4-  Middle trapezius    Lower trapezius    Elbow flexion 5 4  Elbow extension 5 4  Wrist flexion 5 4  Wrist extension 5 4  Wrist ulnar deviation    Wrist radial deviation    Wrist pronation    Wrist supination    Grip strength     (Blank rows = not tested)  CERVICAL SPECIAL TESTS:  Distraction test: Positive    TODAY'S TREATMENT:  DATE:  05/21/22 Seated UT stretch 2 x 30 second holds bilateral Supine cervical retractions 2 x 10  Supine scap retractions 1 x 15 Manual: STM to cervical paraspinals, UT; Grade II-III R and L UPA C3-C6 ; manual traction  3x 1 minute bouts Scap retraction with GH ER RTB 2x 10  Horizontal abduction RTB 2 x 10  Seated cervical retractions 2x 10  Towel grip with rotation 1 minute  05/15/22 ULTT assessment Seated:  Upper trapezius stretch x 30" x 3  L median nerve floss x 1' Standing:  L radial nerve floss x 1'  Pectoralis stretch (T position) x 30" x 3  I' and T's RTB x 10 x 2 Supine:  STM on paracervicals and upper trapezius x 8'  Cervical retractions 3"x 2 x 10   Cervical rotation x 10 on each side 05/12/22 Supine cervical retractions 2 x 10  Supine scap retractions 2 x 10    PATIENT EDUCATION:  Education details: Updated HEP Person educated: Patient Education method: Explanation, Demonstration, and Handouts Education comprehension: verbalized understanding, returned demonstration, verbal cues required, and tactile cues required  HOME EXERCISE PROGRAM: Access Code: [email or web address] URL: [email or web address] 05/21/22- Shoulder External Rotation and Scapular Retraction with Resistance  - 1 x daily - 7 x weekly - 2 sets - 10 reps - Standing Shoulder Horizontal Abduction with Resistance  - 1 x daily - 7 x weekly - 2 sets - 10 reps - Seated Cervical Retraction  - 3-5 x daily - 7 x weekly - 2 sets - 10 reps  Date: 05/15/2022 - Seated Upper Trapezius Stretch  - 1-2 x daily - 5-7 x weekly - 3 reps - 30 hold - Median Nerve Flossing - Tray  - 1-2 x daily - 5-7 x weekly - Standing Radial Nerve Glide  - 1-2 x daily - 5-7 x weekly - Supine Cervical Rotation AROM on Pillow  - 1-2 x daily - 5-7 x weekly - 2 sets - 10 reps - Doorway Pec Stretch at 90 Degrees Abduction  - 1-2 x daily - 5-7 x weekly - 3 sets - 3 reps - 30 hold  Date: 05/12/2022 - Supine Chin Tuck  - 3 x daily - 7 x weekly - 2 sets - 10 reps - Supine Scapular Retraction  - 3 x daily - 7 x weekly - 2 sets - 10 reps  ASSESSMENT:  CLINICAL IMPRESSION: Continued with previously completed exercises with minimal cueing required for  mechanics. Patient with hypomobile and tender cervical spine. Completed manual with decrease in cervical spine symptoms and improvement in mobility, no change in UE symptoms. Began resisted postural/ periscap strengthening which is tolerated well. Patient will continue to benefit from physical therapy in order to improve function and reduce impairment.   OBJECTIVE IMPAIRMENTS: decreased activity tolerance, decreased mobility, decreased ROM, decreased strength, hypomobility, increased muscle spasms, impaired flexibility, improper body mechanics, postural dysfunction, and pain.   ACTIVITY LIMITATIONS: carrying, lifting, bending, reach over head, and caring for others  PARTICIPATION LIMITATIONS: meal prep, cleaning, laundry, shopping, community activity, and yard work  PERSONAL FACTORS: Time since onset of injury/illness/exacerbation and 1-2 comorbidities: CAD, HLD, hx neck surgery  are also affecting patient's functional outcome.   REHAB POTENTIAL: Good  CLINICAL DECISION MAKING: Stable/uncomplicated  EVALUATION COMPLEXITY: Low   GOALS: Goals reviewed with patient? Yes  SHORT TERM GOALS: Target date: 06/02/2022    Patient will be independent with HEP in order to improve functional outcomes. Baseline: Goal status: INITIAL  2.  Patient will report at least 25% improvement in symptoms for improved quality of life. Baseline:  Goal status: INITIAL    LONG TERM GOALS: Target date: 06/23/2022    Patient will report at least 75% improvement in symptoms for improved quality of life. Baseline:  Goal status: INITIAL  2.  Patient will improve FOTO score by at least 7 points in order to indicate improved tolerance to activity. Baseline: 48% function Goal status: INITIAL  3.  Patient will demonstrate at least 25% improvement in cervical ROM in all restricted planes for improved ability to move head while completing chores. Baseline: see above Goal status: INITIAL  4.  Patient will  demonstrate grade of 5/5 MMT grade in all tested musculature as evidence of improved strength to assist with lifting at home. Baseline: see above Goal status: INITIAL    PLAN:  PT FREQUENCY: 2x/week  PT DURATION: 6 weeks  PLANNED INTERVENTIONS: Therapeutic exercises, Therapeutic activity, Neuromuscular re-education, Balance training, Gait training, Patient/Family education, Joint manipulation, Joint mobilization, Stair training, Orthotic/Fit training, DME instructions, Aquatic Therapy, Dry Needling, Electrical stimulation, Spinal manipulation, Spinal mobilization, Cryotherapy, Moist heat, Compression bandaging, scar mobilization, Splintting, Taping, Traction, Ultrasound, Ionotophoresis '4mg'$ /ml Dexamethasone, and Manual therapy  PLAN FOR NEXT SESSION: Continue POC and may progress as tolerated with emphasis on cervical stability, neural mobility, and upper back strength. May add grip strengthening activities.   8:19 AM, 05/21/22 Mearl Latin PT, DPT Physical Therapist at [facility]

## 2022-05-23 ENCOUNTER — Ambulatory Visit (HOSPITAL_COMMUNITY): Payer: Medicare Other

## 2022-05-23 DIAGNOSIS — M6281 Muscle weakness (generalized): Secondary | ICD-10-CM

## 2022-05-23 DIAGNOSIS — M542 Cervicalgia: Secondary | ICD-10-CM

## 2022-05-23 NOTE — Therapy (Addendum)
OUTPATIENT PHYSICAL THERAPY CERVICAL TREATMENT   Patient Name: Adrian Marshall MRN: [id number] DOB:[birth year], 67 y.o., male Today's Date: 05/23/2022  END OF SESSION:  PT End of Session - 05/23/22 0853     Visit Number 4    Number of Visits 12    Date for PT Re-Evaluation 06/23/22    Authorization Type Primary [employer] Secondary Medicare    Progress Note Due on Visit 10    PT Start Time 0855    PT Stop Time 0940    PT Time Calculation (min) 45 min    Activity Tolerance Patient limited by pain            Past Medical History:  Diagnosis Date   Arthritis    NECK   BPH (benign prostatic hyperplasia)    Cancer ([place])    SKIN CANCER-BASAL   Coronary artery disease    History of kidney stones    Hypercholesterolemia    Hypothyroidism    Renal disorder    kidney stones   Sleep apnea    USES CPAP   Thyroid disease    Past Surgical History:  Procedure Laterality Date   ANKLE SURGERY     CYSTOSCOPY N/A 03/04/2021   Procedure: CYSTOSCOPY;  Surgeon: Cleon Gustin, MD;  Location: [facility];  Service: Urology;  Laterality: N/A;   CYSTOSCOPY WITH RETROGRADE PYELOGRAM, URETEROSCOPY AND STENT PLACEMENT Left 08/16/2017   Procedure: CYSTOSCOPY WITH RETROGRADE PYELOGRAM, URETEROSCOPY AND STENT PLACEMENT;  Surgeon: Hollice Espy, MD;  Location: [facility];  Service: Urology;  Laterality: Left;   HERNIA REPAIR     KNEE SURGERY     NECK SURGERY     SHOULDER SURGERY     TRANSURETHRAL RESECTION OF PROSTATE N/A 07/20/2017   Procedure: TRANSURETHRAL RESECTION OF THE PROSTATE (TURP);  Surgeon: Abbie Sons, MD;  Location: [facility];  Service: Urology;  Laterality: N/A;   TRANSURETHRAL RESECTION OF PROSTATE N/A 03/04/2021   Procedure: TRANSURETHRAL RESECTION OF THE PROSTATE (TURP);  Surgeon: Cleon Gustin, MD;  Location: [facility];  Service: Urology;  Laterality: N/A;   URETEROSCOPY WITH HOLMIUM LASER LITHOTRIPSY Left 08/16/2017   Procedure: URETEROSCOPY WITH HOLMIUM LASER  LITHOTRIPSY;  Surgeon: Hollice Espy, MD;  Location: [facility];  Service: Urology;  Laterality: Left;   VASECTOMY     Patient Active Problem List   Diagnosis Date Noted   Sleep apnea 04/02/2018   Ureteral calculus 08/15/2017   S/P TURP 07/20/2017   Nephrolithiasis 06/17/2017    [facility]  REFERRING PROVIDER: Consuella Lose, MD  REFERRING DIAG: cervical spondylosis with radiculopathy  THERAPY DIAG:  Cervicalgia  Muscle weakness (generalized)  Rationale for Evaluation and Treatment: Rehabilitation  ONSET DATE: 30 years; L UE issues for last year  SUBJECTIVE:  SUBJECTIVE STATEMENT: Continues to report no changes in symptoms and has been doing the HEP without issues except that he gets tired fast.  PERTINENT HISTORY:  CAD, HLD, hx neck surgery  PAIN:  Are you having pain? Yes: NPRS scale: 4-5/10 Pain location: neck and LUE Pain description: dull, slow Aggravating factors: constant Relieving factors: none  PRECAUTIONS: None  WEIGHT BEARING RESTRICTIONS: No  FALLS:  Has patient fallen in last 6 months? No  LIVING ENVIRONMENT: Lives with: lives with their spouse Lives in: House/apartment Stairs: Yes: External: 2 steps; none Has following equipment at home: Single point cane  OCCUPATION: Retired  PLOF: Independent  PATIENT GOALS: strength in arm and grip  NEXT MD VISIT: none  OBJECTIVE:   DIAGNOSTIC FINDINGS:  MRI 02/06/22 IMPRESSION: 1. At C5-C6, moderate bilateral foraminal stenosis and mild canal stenosis. 2. C6-C7 ACDF with patent canal and foramina at this level.  PATIENT SURVEYS: FOTO 48% function  COGNITION: Overall cognitive status: Within functional limits for tasks assessed  SENSATION: Light touch: Impaired decreased grossly L  UE  POSTURE: rounded shoulders, forward head, decreased lumbar lordosis, decreased thoracic kyphosis, and kyphosis at upper t/sp  PALPATION: Grossly TTP cervical paraspinals, bilateral UT; Hypomobile cervical spine UPAs   CERVICAL ROM: LUE symptoms worse with extension, L lateral flexion, L rotation  Active ROM A/PROM  eval  Flexion 25% limited   Extension 90 % limited  Right lateral flexion 50% limited  Left lateral flexion 50% limited  Right rotation 25% limited  Left rotation 50% limited *   (Blank rows = not tested) *=pain  UPPER EXTREMITY ROM: WFL for tasks assessed, full functional ROM  Active ROM Right eval Left eval  Shoulder flexion    Shoulder extension    Shoulder abduction    Shoulder adduction    Shoulder extension    Shoulder internal rotation    Shoulder external rotation    Elbow flexion    Elbow extension    Wrist flexion    Wrist extension    Wrist ulnar deviation    Wrist radial deviation    Wrist pronation    Wrist supination     (Blank rows = not tested)  UPPER EXTREMITY MMT:  MMT Right eval Left eval  Shoulder flexion 5 4  Shoulder extension    Shoulder abduction 5 4  Shoulder adduction    Shoulder extension    Shoulder internal rotation 5 4  Shoulder external rotation 5 4-  Middle trapezius    Lower trapezius    Elbow flexion 5 4  Elbow extension 5 4  Wrist flexion 5 4  Wrist extension 5 4  Wrist ulnar deviation    Wrist radial deviation    Wrist pronation    Wrist supination    Grip strength     (Blank rows = not tested)  CERVICAL SPECIAL TESTS:  Distraction test: Positive  TODAY'S TREATMENT:  DATE:  05/23/22 Seated:  Upper trapezius stretch x 30" x 3  L median nerve floss x 1'  Shoulder rolls x front/back x 10 x 2  Thoracic ext and rot on a chair x 10 on each Standing:  L radial nerve floss x  1'  Pectoralis stretch (T position) x 30" x 3  I's GTB x 10 x 3"  Scap retraction with [place] ER GTB x 3"x 10  Supine:  STM on paracervicals and upper trapezius x 8'  Cervical retractions with head on a blue ball 3"x 2 x 10   Cervical rotation with head on a blue ball x 10 x 2  on each side  05/21/22 Seated UT stretch 2 x 30 second holds bilateral Supine cervical retractions 2 x 10  Supine scap retractions 1 x 15 Manual: STM to cervical paraspinals, UT; Grade II-III R and L UPA C3-C6 ; manual traction 3x 1 minute bouts Scap retraction with GH ER RTB 2x 10  Horizontal abduction RTB 2 x 10  Seated cervical retractions 2x 10  Towel grip with rotation 1 minute  05/15/22 ULTT assessment Seated:  Upper trapezius stretch x 30" x 3  L median nerve floss x 1' Standing:  L radial nerve floss x 1'  Pectoralis stretch (T position) x 30" x 3  I' and T's RTB x 10 x 2 Supine:  STM on paracervicals and upper trapezius x 8'  Cervical retractions 3"x 2 x 10   Cervical rotation x 10 on each side  05/12/22 Supine cervical retractions 2 x 10  Supine scap retractions 2 x 10    PATIENT EDUCATION:  Education details: Pain neuroscience education, updated HEP Person educated: Patient Education method: Explanation Education comprehension: verbalized understanding  HOME EXERCISE PROGRAM: Access Code: [email or web address] URL: [email or web address] 05/23/22 - Seated Thoracic Lumbar Extension  - 1-2 x daily - 7 x weekly - 2 sets - 10 reps - Seated Trunk Rotation  - 1-2 x daily - 7 x weekly - 2 sets - 10 reps  05/21/22- Shoulder External Rotation and Scapular Retraction with Resistance  - 1 x daily - 7 x weekly - 2 sets - 10 reps - Standing Shoulder Horizontal Abduction with Resistance  - 1 x daily - 7 x weekly - 2 sets - 10 reps - Seated Cervical Retraction  - 3-5 x daily - 7 x weekly - 2 sets - 10 reps  Date: 05/15/2022 - Seated Upper Trapezius Stretch  - 1-2 x daily - 5-7 x weekly - 3 reps - 30  hold - Median Nerve Flossing - Tray  - 1-2 x daily - 5-7 x weekly - Standing Radial Nerve Glide  - 1-2 x daily - 5-7 x weekly - Supine Cervical Rotation AROM on Pillow  - 1-2 x daily - 5-7 x weekly - 2 sets - 10 reps - Doorway Pec Stretch at 90 Degrees Abduction  - 1-2 x daily - 5-7 x weekly - 3 sets - 3 reps - 30 hold  Date: 05/12/2022 - Supine Chin Tuck  - 3 x daily - 7 x weekly - 2 sets - 10 reps - Supine Scapular Retraction  - 3 x daily - 7 x weekly - 2 sets - 10 reps  ASSESSMENT:  CLINICAL IMPRESSION: Interventions today were geared towards cervical stability and mobility as well as neural mobility. Tolerated all activities without worsening of symptoms. Felt slight relief in tightness during the cervical rotations Demonstrated appropriate levels of fatigue. Required moderate amount of  tactile cueing to ensure correct execution of activity particularly in the nerve glides which provided slight relief To date, skilled PT is required to address the impairments and improve function.  OBJECTIVE IMPAIRMENTS: decreased activity tolerance, decreased mobility, decreased ROM, decreased strength, hypomobility, increased muscle spasms, impaired flexibility, improper body mechanics, postural dysfunction, and pain.   ACTIVITY LIMITATIONS: carrying, lifting, bending, reach over head, and caring for others  PARTICIPATION LIMITATIONS: meal prep, cleaning, laundry, shopping, community activity, and yard work  PERSONAL FACTORS: Time since onset of injury/illness/exacerbation and 1-2 comorbidities: CAD, HLD, hx neck surgery  are also affecting patient's functional outcome.   REHAB POTENTIAL: Good  CLINICAL DECISION MAKING: Stable/uncomplicated  EVALUATION COMPLEXITY: Low   GOALS: Goals reviewed with patient? Yes  SHORT TERM GOALS: Target date: 06/02/2022    Patient will be independent with HEP in order to improve functional outcomes. Baseline: Goal status: IN PROGRESS  2.  Patient will  report at least 25% improvement in symptoms for improved quality of life. Baseline:  Goal status: INITIAL    LONG TERM GOALS: Target date: 06/23/2022    Patient will report at least 75% improvement in symptoms for improved quality of life. Baseline:  Goal status: INITIAL  2.  Patient will improve FOTO score by at least 7 points in order to indicate improved tolerance to activity. Baseline: 48% function Goal status: INITIAL  3.  Patient will demonstrate at least 25% improvement in cervical ROM in all restricted planes for improved ability to move head while completing chores. Baseline: see above Goal status: INITIAL  4.  Patient will demonstrate grade of 5/5 MMT grade in all tested musculature as evidence of improved strength to assist with lifting at home. Baseline: see above Goal status: INITIAL    PLAN:  PT FREQUENCY: 2x/week  PT DURATION: 6 weeks  PLANNED INTERVENTIONS: Therapeutic exercises, Therapeutic activity, Neuromuscular re-education, Balance training, Gait training, Patient/Family education, Joint manipulation, Joint mobilization, Stair training, Orthotic/Fit training, DME instructions, Aquatic Therapy, Dry Needling, Electrical stimulation, Spinal manipulation, Spinal mobilization, Cryotherapy, Moist heat, Compression bandaging, scar mobilization, Splintting, Taping, Traction, Ultrasound, Ionotophoresis 58m/ml Dexamethasone, and Manual therapy  PLAN FOR NEXT SESSION: Continue POC and may progress as tolerated with emphasis on cervical stability, neural mobility, and upper back strength. May add grip strengthening activities.  KHarvie Heck Salar Molden, PT, DPT, OCS Board-Certified Clinical Specialist in [place] ([place]: [id number] 8:54 AM, 05/23/22

## 2022-05-27 ENCOUNTER — Ambulatory Visit (HOSPITAL_COMMUNITY): Payer: Medicare Other

## 2022-05-27 DIAGNOSIS — M542 Cervicalgia: Secondary | ICD-10-CM

## 2022-05-27 DIAGNOSIS — M6281 Muscle weakness (generalized): Secondary | ICD-10-CM

## 2022-05-27 NOTE — Therapy (Signed)
OUTPATIENT PHYSICAL THERAPY CERVICAL TREATMENT   Patient Name: Adrian Marshall MRN: [id number] DOB:03-27-1956, 67 y.o., male Today's Date: 05/27/2022  END OF SESSION:  PT End of Session - 05/27/22 0936     Visit Number 5    Number of Visits 12    Date for PT Re-Evaluation 06/23/22    Authorization Type Primary T[employer] Secondary Medicare    Progress Note Due on Visit 10    PT Start Time 0940    PT Stop Time 1025    PT Time Calculation (min) 45 min             Past Medical History:  Diagnosis Date   Arthritis    NECK   BPH (benign prostatic hyperplasia)    Cancer ([place])    SKIN CANCER-BASAL   Coronary artery disease    History of kidney stones    Hypercholesterolemia    Hypothyroidism    Renal disorder    kidney stones   Sleep apnea    USES CPAP   Thyroid disease    Past Surgical History:  Procedure Laterality Date   ANKLE SURGERY     CYSTOSCOPY N/A 03/04/2021   Procedure: CYSTOSCOPY;  Surgeon: Cleon Gustin, MD;  Location: [facility];  Service: Urology;  Laterality: N/A;   CYSTOSCOPY WITH RETROGRADE PYELOGRAM, URETEROSCOPY AND STENT PLACEMENT Left 08/16/2017   Procedure: CYSTOSCOPY WITH RETROGRADE PYELOGRAM, URETEROSCOPY AND STENT PLACEMENT;  Surgeon: Hollice Espy, MD;  Location: [facility];  Service: Urology;  Laterality: Left;   HERNIA REPAIR     KNEE SURGERY     NECK SURGERY     SHOULDER SURGERY     TRANSURETHRAL RESECTION OF PROSTATE N/A 07/20/2017   Procedure: TRANSURETHRAL RESECTION OF THE PROSTATE (TURP);  Surgeon: Abbie Sons, MD;  Location: [facility];  Service: Urology;  Laterality: N/A;   TRANSURETHRAL RESECTION OF PROSTATE N/A 03/04/2021   Procedure: TRANSURETHRAL RESECTION OF THE PROSTATE (TURP);  Surgeon: Cleon Gustin, MD;  Location: [facility];  Service: Urology;  Laterality: N/A;   URETEROSCOPY WITH HOLMIUM LASER LITHOTRIPSY Left 08/16/2017   Procedure: URETEROSCOPY WITH HOLMIUM LASER LITHOTRIPSY;  Surgeon: Hollice Espy, MD;   Location: [facility];  Service: Urology;  Laterality: Left;   VASECTOMY     Patient Active Problem List   Diagnosis Date Noted   Sleep apnea 04/02/2018   Ureteral calculus 08/15/2017   S/P TURP 07/20/2017   Nephrolithiasis 06/17/2017    [facility]  REFERRING PROVIDER: Consuella Lose, MD  REFERRING DIAG: cervical spondylosis with radiculopathy  THERAPY DIAG:  Cervicalgia  Muscle weakness (generalized)  Rationale for Evaluation and Treatment: Rehabilitation  ONSET DATE: 30 years; L UE issues for last year  SUBJECTIVE:  SUBJECTIVE STATEMENT: Patient reports that he has issues doing his nerve glides at home. His issue is that he can't seem to follow the instructions right. Patient reports no change in symptoms. States that he's stressed over the weekend as he has to assist his neighbor.  PERTINENT HISTORY:  CAD, HLD, hx neck surgery  PAIN:  Are you having pain? Yes: NPRS scale: 4-5/10 Pain location: neck and LUE Pain description: dull, slow Aggravating factors: constant Relieving factors: none  PRECAUTIONS: None  WEIGHT BEARING RESTRICTIONS: No  FALLS:  Has patient fallen in last 6 months? No  LIVING ENVIRONMENT: Lives with: lives with their spouse Lives in: House/apartment Stairs: Yes: External: 2 steps; none Has following equipment at home: Single point cane  OCCUPATION: Retired  PLOF: Independent  PATIENT GOALS: strength in arm and grip  NEXT MD VISIT: none  OBJECTIVE:   DIAGNOSTIC FINDINGS:  MRI 02/06/22 IMPRESSION: 1. At C5-C6, moderate bilateral foraminal stenosis and mild canal stenosis. 2. C6-C7 ACDF with patent canal and foramina at this level.  PATIENT SURVEYS: FOTO 48% function  COGNITION: Overall cognitive status: Within  functional limits for tasks assessed  SENSATION: Light touch: Impaired decreased grossly L UE  POSTURE: rounded shoulders, forward head, decreased lumbar lordosis, decreased thoracic kyphosis, and kyphosis at upper t/sp  PALPATION: Grossly TTP cervical paraspinals, bilateral UT; Hypomobile cervical spine UPAs   CERVICAL ROM: LUE symptoms worse with extension, L lateral flexion, L rotation  Active ROM A/PROM  eval  Flexion 25% limited   Extension 90 % limited  Right lateral flexion 50% limited  Left lateral flexion 50% limited  Right rotation 25% limited  Left rotation 50% limited *   (Blank rows = not tested) *=pain  UPPER EXTREMITY ROM: WFL for tasks assessed, full functional ROM  Active ROM Right eval Left eval  Shoulder flexion    Shoulder extension    Shoulder abduction    Shoulder adduction    Shoulder extension    Shoulder internal rotation    Shoulder external rotation    Elbow flexion    Elbow extension    Wrist flexion    Wrist extension    Wrist ulnar deviation    Wrist radial deviation    Wrist pronation    Wrist supination     (Blank rows = not tested)  UPPER EXTREMITY MMT:  MMT Right eval Left eval  Shoulder flexion 5 4  Shoulder extension    Shoulder abduction 5 4  Shoulder adduction    Shoulder extension    Shoulder internal rotation 5 4  Shoulder external rotation 5 4-  Middle trapezius    Lower trapezius    Elbow flexion 5 4  Elbow extension 5 4  Wrist flexion 5 4  Wrist extension 5 4  Wrist ulnar deviation    Wrist radial deviation    Wrist pronation    Wrist supination    Grip strength     (Blank rows = not tested)  CERVICAL SPECIAL TESTS:  Distraction test: Positive  TODAY'S TREATMENT:  DATE:  05/27/22 Seated:  Upper trapezius stretch x 30" x 3  L median nerve floss x 1'  Shoulder rolls x front/back  x 10 x 2  Thoracic ext on a chair x 10 on each Standing:  L radial nerve floss x 1'  Pectoralis stretch (T position) x 30" x 3  I's GTB x 10 x 3"  Scap retraction with GH ER GTB x 3"x 10  Supine:  STM on paracervicals and upper trapezius x 8'  Cervical retractions with head on a blue ball 3"x 2 x 10   Cervical rotation with head on a blue ball x 10 x 2  on each side L passive median and radial nerve glide x 1' each  Open book with cervical tracking on each side x 10  05/23/22 Seated:  Upper trapezius stretch x 30" x 3  L median nerve floss x 1'  Shoulder rolls x front/back x 10 x 2  Thoracic ext and rot on a chair x 10 on each Standing:  L radial nerve floss x 1'  Pectoralis stretch (T position) x 30" x 3  I's GTB x 10 x 3"  Scap retraction with GH ER GTB x 3"x 10  Supine:  STM on paracervicals and upper trapezius x 8'  Cervical retractions with head on a blue ball 3"x 2 x 10   Cervical rotation with head on a blue ball x 10 x 2  on each side  05/21/22 Seated UT stretch 2 x 30 second holds bilateral Supine cervical retractions 2 x 10  Supine scap retractions 1 x 15 Manual: STM to cervical paraspinals, UT; Grade II-III R and L UPA C3-C6 ; manual traction 3x 1 minute bouts Scap retraction with GH ER RTB 2x 10  Horizontal abduction RTB 2 x 10  Seated cervical retractions 2x 10  Towel grip with rotation 1 minute  05/15/22 ULTT assessment Seated:  Upper trapezius stretch x 30" x 3  L median nerve floss x 1' Standing:  L radial nerve floss x 1'  Pectoralis stretch (T position) x 30" x 3  I' and T's RTB x 10 x 2 Supine:  STM on paracervicals and upper trapezius x 8'  Cervical retractions 3"x 2 x 10   Cervical rotation x 10 on each side  05/12/22 Supine cervical retractions 2 x 10  Supine scap retractions 2 x 10    PATIENT EDUCATION:  Education details: updated HEP Person educated: Patient Education method: Explanation, demonstration Education comprehension: verbalized  understanding, excellent return demonstration  HOME EXERCISE PROGRAM: Access Code: [email or web address] URL: [email or web address] 05/27/22 - Sidelying Thoracic Rotation with Open Book  - 1-2 x daily - 7 x weekly - 2 sets - 10 reps - Seated Shoulder Rolls  - 1-2 x daily - 7 x weekly - 2 sets - 10 reps  05/23/22 - Seated Thoracic Lumbar Extension  - 1-2 x daily - 7 x weekly - 2 sets - 10 reps - Seated Trunk Rotation  - 1-2 x daily - 7 x weekly - 2 sets - 10 reps  05/21/22- Shoulder External Rotation and Scapular Retraction with Resistance  - 1 x daily - 7 x weekly - 2 sets - 10 reps - Standing Shoulder Horizontal Abduction with Resistance  - 1 x daily - 7 x weekly - 2 sets - 10 reps - Seated Cervical Retraction  - 3-5 x daily - 7 x weekly - 2 sets - 10 reps  Date: 05/15/2022 - Seated Upper Trapezius  Stretch  - 1-2 x daily - 5-7 x weekly - 3 reps - 30 hold - Median Nerve Flossing - Tray  - 1-2 x daily - 5-7 x weekly - Standing Radial Nerve Glide  - 1-2 x daily - 5-7 x weekly - Supine Cervical Rotation AROM on Pillow  - 1-2 x daily - 5-7 x weekly - 2 sets - 10 reps - Doorway Pec Stretch at 90 Degrees Abduction  - 1-2 x daily - 5-7 x weekly - 3 sets - 3 reps - 30 hold  Date: 05/12/2022 - Supine Chin Tuck  - 3 x daily - 7 x weekly - 2 sets - 10 reps - Supine Scapular Retraction  - 3 x daily - 7 x weekly - 2 sets - 10 reps  ASSESSMENT:  CLINICAL IMPRESSION: Interventions were continue today and was geared towards cervical stability and mobility as well as neural mobility. Tolerated all activities without worsening of symptoms. Felt slight relief in tightness during the cervical rotations,and chin tucks. Also felt good relief on passive nerve glides. Demonstrated appropriate levels of fatigue. Required moderate amount of tactile cueing to ensure correct execution of activity particularly in the nerve glides (difficulty with lat flex and rot) which provided slight relief.  To date, skilled PT is  required to address the impairments and improve function.  OBJECTIVE IMPAIRMENTS: decreased activity tolerance, decreased mobility, decreased ROM, decreased strength, hypomobility, increased muscle spasms, impaired flexibility, improper body mechanics, postural dysfunction, and pain.   ACTIVITY LIMITATIONS: carrying, lifting, bending, reach over head, and caring for others  PARTICIPATION LIMITATIONS: meal prep, cleaning, laundry, shopping, community activity, and yard work  PERSONAL FACTORS: Time since onset of injury/illness/exacerbation and 1-2 comorbidities: CAD, HLD, hx neck surgery  are also affecting patient's functional outcome.   REHAB POTENTIAL: Good  CLINICAL DECISION MAKING: Stable/uncomplicated  EVALUATION COMPLEXITY: Low   GOALS: Goals reviewed with patient? Yes  SHORT TERM GOALS: Target date: 06/02/2022    Patient will be independent with HEP in order to improve functional outcomes. Baseline: Goal status: IN PROGRESS  2.  Patient will report at least 25% improvement in symptoms for improved quality of life. Baseline:  Goal status: INITIAL    LONG TERM GOALS: Target date: 06/23/2022    Patient will report at least 75% improvement in symptoms for improved quality of life. Baseline:  Goal status: INITIAL  2.  Patient will improve FOTO score by at least 7 points in order to indicate improved tolerance to activity. Baseline: 48% function Goal status: INITIAL  3.  Patient will demonstrate at least 25% improvement in cervical ROM in all restricted planes for improved ability to move head while completing chores. Baseline: see above Goal status: INITIAL  4.  Patient will demonstrate grade of 5/5 MMT grade in all tested musculature as evidence of improved strength to assist with lifting at home. Baseline: see above Goal status: INITIAL  PLAN:  PT FREQUENCY: 2x/week  PT DURATION: 6 weeks  PLANNED INTERVENTIONS: Therapeutic exercises, Therapeutic activity,  Neuromuscular re-education, Balance training, Gait training, Patient/Family education, Joint manipulation, Joint mobilization, Stair training, Orthotic/Fit training, DME instructions, Aquatic Therapy, Dry Needling, Electrical stimulation, Spinal manipulation, Spinal mobilization, Cryotherapy, Moist heat, Compression bandaging, scar mobilization, Splintting, Taping, Traction, Ultrasound, Ionotophoresis 54m/ml Dexamethasone, and Manual therapy  PLAN FOR NEXT SESSION: Continue POC and may progress as tolerated with emphasis on cervical stability, neural mobility, and upper back strength. May add grip strengthening activities.  KHarvie Heck Alicen Donalson, PT, DPT, OCS Board-Certified Clinical Specialist in  [place] # ([place]): [phone] T 9:37 AM, 05/27/22

## 2022-05-29 ENCOUNTER — Ambulatory Visit (HOSPITAL_COMMUNITY): Payer: Medicare Other

## 2022-05-29 DIAGNOSIS — R29898 Other symptoms and signs involving the musculoskeletal system: Secondary | ICD-10-CM

## 2022-05-29 DIAGNOSIS — M6281 Muscle weakness (generalized): Secondary | ICD-10-CM

## 2022-05-29 DIAGNOSIS — M542 Cervicalgia: Secondary | ICD-10-CM

## 2022-05-29 NOTE — Therapy (Signed)
OUTPATIENT PHYSICAL THERAPY CERVICAL TREATMENT   Patient Name: Adrian Marshall MRN: [id number] DOB:1955-07-07, 67 y.o., male Today's Date: 05/29/2022  END OF SESSION:  PT End of Session - 05/29/22 0746     Visit Number 6    Number of Visits 12    Date for PT Re-Evaluation 06/23/22    Authorization Type Primary [employer] Secondary Medicare    Progress Note Due on Visit 10    PT Start Time 0735    PT Stop Time 0815    PT Time Calculation (min) 40 min    Activity Tolerance Patient tolerated treatment well            Past Medical History:  Diagnosis Date   Arthritis    NECK   BPH (benign prostatic hyperplasia)    Cancer ([place])    SKIN CANCER-BASAL   Coronary artery disease    History of kidney stones    Hypercholesterolemia    Hypothyroidism    Renal disorder    kidney stones   Sleep apnea    USES CPAP   Thyroid disease    Past Surgical History:  Procedure Laterality Date   ANKLE SURGERY     CYSTOSCOPY N/A 03/04/2021   Procedure: CYSTOSCOPY;  Surgeon: Cleon Gustin, MD;  Location: [facility];  Service: Urology;  Laterality: N/A;   CYSTOSCOPY WITH RETROGRADE PYELOGRAM, URETEROSCOPY AND STENT PLACEMENT Left 08/16/2017   Procedure: CYSTOSCOPY WITH RETROGRADE PYELOGRAM, URETEROSCOPY AND STENT PLACEMENT;  Surgeon: Hollice Espy, MD;  Location: [facility];  Service: Urology;  Laterality: Left;   HERNIA REPAIR     KNEE SURGERY     NECK SURGERY     SHOULDER SURGERY     TRANSURETHRAL RESECTION OF PROSTATE N/A 07/20/2017   Procedure: TRANSURETHRAL RESECTION OF THE PROSTATE (TURP);  Surgeon: Abbie Sons, MD;  Location: [facility];  Service: Urology;  Laterality: N/A;   TRANSURETHRAL RESECTION OF PROSTATE N/A 03/04/2021   Procedure: TRANSURETHRAL RESECTION OF THE PROSTATE (TURP);  Surgeon: Cleon Gustin, MD;  Location: [facility];  Service: Urology;  Laterality: N/A;   URETEROSCOPY WITH HOLMIUM LASER LITHOTRIPSY Left 08/16/2017   Procedure: URETEROSCOPY WITH HOLMIUM LASER  LITHOTRIPSY;  Surgeon: Hollice Espy, MD;  Location: [facility];  Service: Urology;  Laterality: Left;   VASECTOMY     Patient Active Problem List   Diagnosis Date Noted   Sleep apnea 04/02/2018   Ureteral calculus 08/15/2017   S/P TURP 07/20/2017   Nephrolithiasis 06/17/2017    [facility]P: [facility]  REFERRING PROVIDER: Consuella Lose, MD  REFERRING DIAG: cervical spondylosis with radiculopathy  THERAPY DIAG:  Cervicalgia  Muscle weakness (generalized)  Other symptoms and signs involving the musculoskeletal system  Rationale for Evaluation and Treatment: Rehabilitation  ONSET DATE: 30 years; L UE issues for last year  SUBJECTIVE:  SUBJECTIVE STATEMENT: Patient reports that no change in symptoms but claims that his neck feels better after the last session. Patient also reports that his neck is doing better because he can now turn easily when driving. However, patient reports that his L hand is a little more painful today (4/10 on NPRS) than yesterday. Claims that he did a lot of hand exercises yesterday.  PERTINENT HISTORY:  CAD, HLD, hx neck surgery  PAIN:  Are you having pain? Yes: NPRS scale: 4/10 Pain location: neck and LUE Pain description: dull, slow Aggravating factors: constant Relieving factors: none  PRECAUTIONS: None  WEIGHT BEARING RESTRICTIONS: No  FALLS:  Has patient fallen in last 6 months? No  LIVING ENVIRONMENT: Lives with: lives with their spouse Lives in: House/apartment Stairs: Yes: External: 2 steps; none Has following equipment at home: Single point cane  OCCUPATION: Retired  PLOF: Independent  PATIENT GOALS: strength in arm and grip  NEXT MD VISIT: none  OBJECTIVE:   DIAGNOSTIC FINDINGS:  MRI 02/06/22 IMPRESSION: 1. At C5-C6,  moderate bilateral foraminal stenosis and mild canal stenosis. 2. C6-C7 ACDF with patent canal and foramina at this level.  PATIENT SURVEYS: FOTO 48% function  COGNITION: Overall cognitive status: Within functional limits for tasks assessed  SENSATION: Light touch: Impaired decreased grossly L UE  POSTURE: rounded shoulders, forward head, decreased lumbar lordosis, decreased thoracic kyphosis, and kyphosis at upper t/sp  PALPATION: Grossly TTP cervical paraspinals, bilateral UT; Hypomobile cervical spine UPAs   CERVICAL ROM: LUE symptoms worse with extension, L lateral flexion, L rotation  Active ROM A/PROM  eval  Flexion 25% limited   Extension 90 % limited  Right lateral flexion 50% limited  Left lateral flexion 50% limited  Right rotation 25% limited  Left rotation 50% limited *   (Blank rows = not tested) *=pain  UPPER EXTREMITY ROM: WFL for tasks assessed, full functional ROM  Active ROM Right eval Left eval  Shoulder flexion    Shoulder extension    Shoulder abduction    Shoulder adduction    Shoulder extension    Shoulder internal rotation    Shoulder external rotation    Elbow flexion    Elbow extension    Wrist flexion    Wrist extension    Wrist ulnar deviation    Wrist radial deviation    Wrist pronation    Wrist supination     (Blank rows = not tested)  UPPER EXTREMITY MMT:  MMT Right eval Left eval  Shoulder flexion 5 4  Shoulder extension    Shoulder abduction 5 4  Shoulder adduction    Shoulder extension    Shoulder internal rotation 5 4  Shoulder external rotation 5 4-  Middle trapezius    Lower trapezius    Elbow flexion 5 4  Elbow extension 5 4  Wrist flexion 5 4  Wrist extension 5 4  Wrist ulnar deviation    Wrist radial deviation    Wrist pronation    Wrist supination    Grip strength     (Blank rows = not tested)  CERVICAL SPECIAL TESTS:  Distraction test: Positive  TODAY'S TREATMENT:  DATE:  05/29/22 Seated:  Upper trapezius stretch x 30" x 3  L median nerve floss x 1'  Shoulder rolls x front/back x 10 x 2  Thoracic ext on a chair x 10 on each Standing:  L radial nerve floss x 1'  Pectoralis stretch (T position) x 30" x 3  I's, GTB x 10 x 5"  Scap retraction with GH ER GTB x 5"x 10  Supine:  STM on paracervicals and upper trapezius x 8' L passive median and radial nerve glide x 1' each  Cervical retractions with head on a blue ball 5"x 2 x 10   Cervical rotation with head on a blue ball x 5"x 10 x 2  on each side  Open book with cervical tracking on each side x 10  05/27/22 Seated:  Upper trapezius stretch x 30" x 3  L median nerve floss x 1'  Shoulder rolls x front/back x 10 x 2  Thoracic ext on a chair x 10 on each Standing:  L radial nerve floss x 1'  Pectoralis stretch (T position) x 30" x 3  I's GTB x 10 x 3"  Scap retraction with GH ER GTB x 3"x 10  Supine:  STM on paracervicals and upper trapezius x 8'  Cervical retractions with head on a blue ball 3"x 2 x 10   Cervical rotation with head on a blue ball x 10 x 2  on each side L passive median and radial nerve glide x 1' each  Open book with cervical tracking on each side x 10  05/23/22 Seated:  Upper trapezius stretch x 30" x 3  L median nerve floss x 1'  Shoulder rolls x front/back x 10 x 2  Thoracic ext and rot on a chair x 10 on each Standing:  L radial nerve floss x 1'  Pectoralis stretch (T position) x 30" x 3  I's GTB x 10 x 3"  Scap retraction with GH ER GTB x 3"x 10  Supine:  STM on paracervicals and upper trapezius x 8'  Cervical retractions with head on a blue ball 3"x 2 x 10   Cervical rotation with head on a blue ball x 10 x 2  on each side  05/21/22 Seated UT stretch 2 x 30 second holds bilateral Supine cervical retractions 2 x 10  Supine scap retractions 1 x 15 Manual: STM to cervical  paraspinals, UT; Grade II-III R and L UPA C3-C6 ; manual traction 3x 1 minute bouts Scap retraction with GH ER RTB 2x 10  Horizontal abduction RTB 2 x 10  Seated cervical retractions 2x 10  Towel grip with rotation 1 minute  05/15/22 ULTT assessment Seated:  Upper trapezius stretch x 30" x 3  L median nerve floss x 1' Standing:  L radial nerve floss x 1'  Pectoralis stretch (T position) x 30" x 3  I' and T's RTB x 10 x 2 Supine:  STM on paracervicals and upper trapezius x 8'  Cervical retractions 3"x 2 x 10   Cervical rotation x 10 on each side  05/12/22 Supine cervical retractions 2 x 10  Supine scap retractions 2 x 10    PATIENT EDUCATION:  Education details: updated HEP Person educated: Patient Education method: Explanation, demonstration Education comprehension: verbalized understanding, excellent return demonstration  HOME EXERCISE PROGRAM: Access Code: [email or web address] URL: [email or web address] 05/27/22 - Sidelying Thoracic Rotation with Open Book  - 1-2 x daily - 7 x weekly - 2 sets - 10  reps - Seated Shoulder Rolls  - 1-2 x daily - 7 x weekly - 2 sets - 10 reps  05/23/22 - Seated Thoracic Lumbar Extension  - 1-2 x daily - 7 x weekly - 2 sets - 10 reps - Seated Trunk Rotation  - 1-2 x daily - 7 x weekly - 2 sets - 10 reps  05/21/22- Shoulder External Rotation and Scapular Retraction with Resistance  - 1 x daily - 7 x weekly - 2 sets - 10 reps - Standing Shoulder Horizontal Abduction with Resistance  - 1 x daily - 7 x weekly - 2 sets - 10 reps - Seated Cervical Retraction  - 3-5 x daily - 7 x weekly - 2 sets - 10 reps  Date: 05/15/2022 - Seated Upper Trapezius Stretch  - 1-2 x daily - 5-7 x weekly - 3 reps - 30 hold - Median Nerve Flossing - Tray  - 1-2 x daily - 5-7 x weekly - Standing Radial Nerve Glide  - 1-2 x daily - 5-7 x weekly - Supine Cervical Rotation AROM on Pillow  - 1-2 x daily - 5-7 x weekly - 2 sets - 10 reps - Doorway Pec Stretch at 90 Degrees  Abduction  - 1-2 x daily - 5-7 x weekly - 3 sets - 3 reps - 30 hold  Date: 05/12/2022 - Supine Chin Tuck  - 3 x daily - 7 x weekly - 2 sets - 10 reps - Supine Scapular Retraction  - 3 x daily - 7 x weekly - 2 sets - 10 reps  ASSESSMENT:  CLINICAL IMPRESSION: Interventions were performed towards cervical stability and mobility as well as neural mobility. Tolerated all activities without worsening of symptoms. Felt relief in cervical rotations, chin tucks and passive nerve glides. Demonstrated appropriate levels of fatigue. Required moderate amount of tactile cueing to ensure correct execution of activity particularly in the nerve glides (difficulty with lat flex and rot) which provided slight relief. Excessive shoulder hiking was also noted on today's session. To date, skilled PT is required to address the impairments and improve function.  OBJECTIVE IMPAIRMENTS: decreased activity tolerance, decreased mobility, decreased ROM, decreased strength, hypomobility, increased muscle spasms, impaired flexibility, improper body mechanics, postural dysfunction, and pain.   ACTIVITY LIMITATIONS: carrying, lifting, bending, reach over head, and caring for others  PARTICIPATION LIMITATIONS: meal prep, cleaning, laundry, shopping, community activity, and yard work  PERSONAL FACTORS: Time since onset of injury/illness/exacerbation and 1-2 comorbidities: CAD, HLD, hx neck surgery  are also affecting patient's functional outcome.   REHAB POTENTIAL: Good  CLINICAL DECISION MAKING: Stable/uncomplicated  EVALUATION COMPLEXITY: Low   GOALS: Goals reviewed with patient? Yes  SHORT TERM GOALS: Target date: 06/02/2022    Patient will be independent with HEP in order to improve functional outcomes. Baseline: Goal status: IN PROGRESS  2.  Patient will report at least 25% improvement in symptoms for improved quality of life. Baseline:  Goal status: INITIAL    LONG TERM GOALS: Target date: 06/23/2022     Patient will report at least 75% improvement in symptoms for improved quality of life. Baseline:  Goal status: INITIAL  2.  Patient will improve FOTO score by at least 7 points in order to indicate improved tolerance to activity. Baseline: 48% function Goal status: INITIAL  3.  Patient will demonstrate at least 25% improvement in cervical ROM in all restricted planes for improved ability to move head while completing chores. Baseline: see above Goal status: INITIAL  4.  Patient  will demonstrate grade of 5/5 MMT grade in all tested musculature as evidence of improved strength to assist with lifting at home. Baseline: see above Goal status: INITIAL  PLAN:  PT FREQUENCY: 2x/week  PT DURATION: 6 weeks  PLANNED INTERVENTIONS: Therapeutic exercises, Therapeutic activity, Neuromuscular re-education, Balance training, Gait training, Patient/Family education, Joint manipulation, Joint mobilization, Stair training, Orthotic/Fit training, DME instructions, Aquatic Therapy, Dry Needling, Electrical stimulation, Spinal manipulation, Spinal mobilization, Cryotherapy, Moist heat, Compression bandaging, scar mobilization, Splintting, Taping, Traction, Ultrasound, Ionotophoresis 57m/ml Dexamethasone, and Manual therapy  PLAN FOR NEXT SESSION: Continue POC and may progress as tolerated with emphasis on cervical stability, neural mobility, and upper back strength. May add grip strengthening activities.  KHarvie Heck Maritza Hosterman, PT, DPT, OCS Board-Certified Clinical Specialist [place]: [id number] 7:47 AM, 05/29/22

## 2022-06-03 ENCOUNTER — Encounter (HOSPITAL_COMMUNITY): Payer: TRICARE For Life (TFL) | Admitting: Physical Therapy

## 2022-06-09 ENCOUNTER — Encounter (HOSPITAL_COMMUNITY): Payer: TRICARE For Life (TFL) | Admitting: Physical Therapy

## 2022-06-09 ENCOUNTER — Other Ambulatory Visit: Payer: Self-pay

## 2022-06-09 DIAGNOSIS — N201 Calculus of ureter: Secondary | ICD-10-CM

## 2022-06-11 ENCOUNTER — Ambulatory Visit: Payer: Medicare Other | Admitting: Urology

## 2022-06-12 ENCOUNTER — Encounter (HOSPITAL_COMMUNITY): Payer: TRICARE For Life (TFL) | Admitting: Physical Therapy

## 2022-06-17 ENCOUNTER — Encounter (HOSPITAL_COMMUNITY): Payer: TRICARE For Life (TFL)

## 2022-06-19 ENCOUNTER — Encounter (HOSPITAL_COMMUNITY): Payer: TRICARE For Life (TFL) | Admitting: Physical Therapy

## 2022-07-01 ENCOUNTER — Other Ambulatory Visit: Payer: Self-pay

## 2022-07-01 ENCOUNTER — Encounter: Payer: Self-pay | Admitting: Emergency Medicine

## 2022-07-01 ENCOUNTER — Ambulatory Visit
Admission: EM | Admit: 2022-07-01 | Discharge: 2022-07-01 | Disposition: A | Discharge status: Home / Self Care | Payer: Medicare Other | Attending: Nurse Practitioner | Admitting: Nurse Practitioner

## 2022-07-01 DIAGNOSIS — H6121 Impacted cerumen, right ear: Secondary | ICD-10-CM | POA: Diagnosis not present

## 2022-07-01 NOTE — Discharge Instructions (Addendum)
We were able to remove most of the earwax from your ears today, however cannot remove all that Please start using the Debrox drops over-the-counter to help loosen the rest of the earwax Increase Flonase to twice daily to see if this helps with the fluid behind your ears If it does not and your hearing does not improve, please follow-up with an ear nose and throat doctor-contact information is below

## 2022-07-01 NOTE — ED Triage Notes (Signed)
Pt reports was diagnosed with covid in feb and reports ever since diagnosis has reported "fluid like feeing" in bilateral ears. Pt reports is currently taking otc allergy medication and flonase. Denies any fever. Intermittent ringing in ears.

## 2022-07-01 NOTE — ED Provider Notes (Signed)
[facility]    CSN: [id number] Arrival date & time: 07/01/22  1552      History   Chief Complaint Chief Complaint  Patient presents with   Ear Fullness    HPI Adrian Marshall is a 67 y.o. male.   Patient presents today for 1 month of ears feeling clogged/plugged, fluid in his ears, and decreased hearing.  Denies recent cough, congestion, sore throat, or fever.  Reports when symptoms first started, he had tested positive for COVID-19 and symptoms were mild.  Reports he wears hearing aids but they are not really helping with his hearing right now.  No history of cerumenosis.  Has been taking OTC decongestants and oral allergy medication with Flonase nasal spray daily.    Past Medical History:  Diagnosis Date   Arthritis    NECK   BPH (benign prostatic hyperplasia)    Cancer ([place])    SKIN CANCER-BASAL   Coronary artery disease    History of kidney stones    Hypercholesterolemia    Hypothyroidism    Renal disorder    kidney stones   Sleep apnea    USES CPAP   Thyroid disease     Patient Active Problem List   Diagnosis Date Noted   Sleep apnea 04/02/2018   Ureteral calculus 08/15/2017   S/P TURP 07/20/2017   Nephrolithiasis 06/17/2017    Past Surgical History:  Procedure Laterality Date   ANKLE SURGERY     CYSTOSCOPY N/A 03/04/2021   Procedure: CYSTOSCOPY;  Surgeon: Cleon Gustin, MD;  Location: [facility];  Service: Urology;  Laterality: N/A;   CYSTOSCOPY WITH RETROGRADE PYELOGRAM, URETEROSCOPY AND STENT PLACEMENT Left 08/16/2017   Procedure: CYSTOSCOPY WITH RETROGRADE PYELOGRAM, URETEROSCOPY AND STENT PLACEMENT;  Surgeon: Hollice Espy, MD;  Location: [facility];  Service: Urology;  Laterality: Left;   HERNIA REPAIR     KNEE SURGERY     NECK SURGERY     SHOULDER SURGERY     TRANSURETHRAL RESECTION OF PROSTATE N/A 07/20/2017   Procedure: TRANSURETHRAL RESECTION OF THE PROSTATE (TURP);  Surgeon: Abbie Sons, MD;  Location: [facility];  Service:  Urology;  Laterality: N/A;   TRANSURETHRAL RESECTION OF PROSTATE N/A 03/04/2021   Procedure: TRANSURETHRAL RESECTION OF THE PROSTATE (TURP);  Surgeon: Cleon Gustin, MD;  Location: [facility];  Service: Urology;  Laterality: N/A;   URETEROSCOPY WITH HOLMIUM LASER LITHOTRIPSY Left 08/16/2017   Procedure: URETEROSCOPY WITH HOLMIUM LASER LITHOTRIPSY;  Surgeon: Hollice Espy, MD;  Location: [facility];  Service: Urology;  Laterality: Left;   VASECTOMY         Home Medications    Prior to Admission medications   Medication Sig Start Date End Date Taking? Authorizing Provider  amitriptyline (ELAVIL) 50 MG tablet Take 50 mg by mouth at bedtime. 03/15/21   [provider]  aspirin EC 81 MG tablet Take 81 mg by mouth daily.    [provider]  atorvastatin (LIPITOR) 80 MG tablet Take 80 mg by mouth every evening.     [provider]  latanoprost (XALATAN) 0.005 % ophthalmic solution Place 1 drop into both eyes at bedtime.    [provider]  levothyroxine (SYNTHROID) 88 MCG tablet Take 176 mcg by mouth daily before breakfast.    [provider]  phenazopyridine (PYRIDIUM) 200 MG tablet Take 1 tablet (200 mg total) by mouth 3 (three) times daily as needed for pain. 04/16/21   McKenzie, Candee Furbish, MD  tamsulosin (FLOMAX) 0.4 MG  CAPS capsule Take 0.8 mg by mouth daily after supper. Patient not taking: Reported on 06/11/2021    [provider]  UROCIT-K 10 10 MEQ (1080 MG) SR tablet Take 10 mEq by mouth 2 (two) times daily with a meal. 02/27/20   [provider]    Family History Family History  Problem Relation Age of Onset   Prostate cancer Neg Hx    Bladder Cancer Neg Hx    Kidney cancer Neg Hx     Social History Social History   Tobacco Use   Smoking status: Never   Smokeless tobacco: Never  Vaping Use   Vaping Use: Never used  Substance Use Topics   Alcohol use: Yes    Comment: RARE   Drug use: No     Allergies    Cortisone, Celestone soluspan [betamethasone], Neurontin [gabapentin], and Other   Review of Systems Review of Systems Per HPI  Physical Exam Triage Vital Signs ED Triage Vitals  Enc Vitals Group     BP 07/01/22 1607 134/75     Pulse Rate 07/01/22 1607 95     Resp 07/01/22 1607 20     Temp 07/01/22 1607 98 F (36.7 C)     Temp Source 07/01/22 1607 Oral     SpO2 07/01/22 1607 94 %     Weight --      Height --      Head Circumference --      Peak Flow --      Pain Score 07/01/22 1606 0     Pain Loc --      Pain Edu? --      Excl. in [place]? --    No data found.  Updated Vital Signs BP 134/75 (BP Location: Right Arm)   Pulse 95   Temp 98 F (36.7 C) (Oral)   Resp 20   SpO2 94%   Visual Acuity Right Eye Distance:   Left Eye Distance:   Bilateral Distance:    Right Eye Near:   Left Eye Near:    Bilateral Near:     Physical Exam Vitals and nursing note reviewed.  Constitutional:      General: He is not in acute distress.    Appearance: Normal appearance. He is not ill-appearing or toxic-appearing.  HENT:     Head: Normocephalic and atraumatic.     Right Ear: Ear canal and external ear normal. There is impacted cerumen.     Left Ear: Ear canal and external ear normal. A middle ear effusion is present.     Ears:     Comments: Nonimpacted cerumen noted to left external auditory canal    Nose: Congestion and rhinorrhea present.     Mouth/Throat:     Mouth: Mucous membranes are moist.     Pharynx: Oropharynx is clear.  Pulmonary:     Effort: Pulmonary effort is normal. No respiratory distress.  Skin:    General: Skin is warm and dry.     Capillary Refill: Capillary refill takes less than 2 seconds.     Coloration: Skin is not jaundiced or pale.     Findings: No erythema or rash.  Neurological:     Mental Status: He is alert and oriented to person, place, and time.      UC Treatments / Results  Labs (all labs ordered are listed, but only abnormal results  are displayed) Labs Reviewed - No data to display  EKG   Radiology No results found.  Procedures Ear Cerumen Removal  Date/Time: 07/01/2022 4:37 PM  Performed by: Eulogio Bear, NP Authorized by: Eulogio Bear, NP   Consent:    Consent obtained:  Verbal   Consent given by:  Patient   Risks, benefits, and alternatives were discussed: yes     Risks discussed:  Pain, TM perforation and dizziness Universal protocol:    Procedure explained and questions answered to patient or proxy's satisfaction: yes     Relevant documents present and verified: yes     Patient identity confirmed:  Verbally with patient Procedure details:    Location:  R ear   Procedure type: irrigation     Procedure outcomes: unable to remove cerumen   Post-procedure details:    Inspection:  Some cerumen remaining, no bleeding and TM intact   Post-procedure hearing quality: no change.   Procedure completion:  Tolerated well, no immediate complications  (including critical care time)  Medications Ordered in UC Medications - No data to display  Initial Impression / Assessment and Plan / UC Course  I have reviewed the triage vital signs and the nursing notes.  Pertinent labs & imaging results that were available during my care of the patient were reviewed by me and considered in my medical decision making (see chart for details).   Patient is well-appearing, normotensive, afebrile, not tachycardic, not tachypneic, oxygenating well on room air.    1. Impacted cerumen of right ear Attempted ear lavage as above; unable to fully successfully remove impacted cerumen from right ear Start over-the-counter Debrox Recommended follow-up with ENT regarding hearing loss, bilateral effusion Increase Flonase to twice daily in the meantime  The patient was given the opportunity to ask questions.  All questions answered to their satisfaction.  The patient is in agreement to this plan.    Final Clinical  Impressions(s) / UC Diagnoses   Final diagnoses:  Impacted cerumen of right ear     Discharge Instructions      We were able to remove most of the earwax from your ears today, however cannot remove all that Please start using the Debrox drops over-the-counter to help loosen the rest of the earwax Increase Flonase to twice daily to see if this helps with the fluid behind your ears If it does not and your hearing does not improve, please follow-up with an ear nose and throat doctor-contact information is below     ED Prescriptions   None    PDMP not reviewed this encounter.   Eulogio Bear, NP 07/01/22 [phone]

## 2022-07-28 ENCOUNTER — Ambulatory Visit (HOSPITAL_COMMUNITY)
Admission: RE | Admit: 2022-07-28 | Discharge: 2022-07-28 | Disposition: A | Discharge status: Home / Self Care | Payer: Medicare Other | Source: Ambulatory Visit | Attending: Urology | Admitting: Urology

## 2022-07-28 DIAGNOSIS — N201 Calculus of ureter: Secondary | ICD-10-CM | POA: Insufficient documentation

## 2022-07-30 ENCOUNTER — Ambulatory Visit (INDEPENDENT_AMBULATORY_CARE_PROVIDER_SITE_OTHER): Payer: Medicare Other | Admitting: Urology

## 2022-07-30 VITALS — BP 154/92 | HR 98

## 2022-07-30 DIAGNOSIS — N401 Enlarged prostate with lower urinary tract symptoms: Secondary | ICD-10-CM

## 2022-07-30 DIAGNOSIS — N201 Calculus of ureter: Secondary | ICD-10-CM

## 2022-07-30 DIAGNOSIS — R3912 Poor urinary stream: Secondary | ICD-10-CM

## 2022-07-30 DIAGNOSIS — N138 Other obstructive and reflux uropathy: Secondary | ICD-10-CM | POA: Diagnosis not present

## 2022-07-30 LAB — URINALYSIS, ROUTINE W REFLEX MICROSCOPIC
Bilirubin, UA: NEGATIVE
Ketones, UA: NEGATIVE
Leukocytes,UA: NEGATIVE
Nitrite, UA: NEGATIVE
Protein,UA: NEGATIVE
RBC, UA: NEGATIVE
Specific Gravity, UA: <1.005 — ABNORMAL LOW (ref 1.005–1.030)
Urobilinogen, Ur: 0.2 mg/dL (ref 0.2–1.0)
pH, UA: 6 (ref 5.0–7.5)

## 2022-07-30 NOTE — Progress Notes (Signed)
07/30/2022 11:27 AM   Adrian Marshall 1955/05/31 [id number]  Referring provider: Clinic, Adrian Marshall [address] [facility] [place],  [place] [address]  Followup BPH and nephrolithiasis   HPI: Adrian Marshall is a 46NG here for followup for BPH and nephrolithiasis. IPSS 12 QOl 1 off flomax. He developed right flank pain which is similar to his prior stone events. KUB from today does not show a definitive calculus. The pain is sharp and intermittent. No nausea or vomiting. No fevers.    PMH: Past Medical History:  Diagnosis Date   Arthritis    NECK   BPH (benign prostatic hyperplasia)    Cancer (HCC)    SKIN CANCER-BASAL   Coronary artery disease    History of kidney stones    Hypercholesterolemia    Hypothyroidism    Renal disorder    kidney stones   Sleep apnea    USES CPAP   Thyroid disease     Surgical History: Past Surgical History:  Procedure Laterality Date   ANKLE SURGERY     CYSTOSCOPY N/A 03/04/2021   Procedure: CYSTOSCOPY;  Surgeon: Malen Gauze, MD;  Location: [facility];  Service: Urology;  Laterality: N/A;   CYSTOSCOPY WITH RETROGRADE PYELOGRAM, URETEROSCOPY AND STENT PLACEMENT Left 08/16/2017   Procedure: CYSTOSCOPY WITH RETROGRADE PYELOGRAM, URETEROSCOPY AND STENT PLACEMENT;  Surgeon: Vanna Scotland, MD;  Location: [facility];  Service: Urology;  Laterality: Left;   HERNIA REPAIR     KNEE SURGERY     NECK SURGERY     SHOULDER SURGERY     TRANSURETHRAL RESECTION OF PROSTATE N/A 07/20/2017   Procedure: TRANSURETHRAL RESECTION OF THE PROSTATE (TURP);  Surgeon: Riki Altes, MD;  Location: [facility];  Service: Urology;  Laterality: N/A;   TRANSURETHRAL RESECTION OF PROSTATE N/A 03/04/2021   Procedure: TRANSURETHRAL RESECTION OF THE PROSTATE (TURP);  Surgeon: Malen Gauze, MD;  Location: [facility];  Service: Urology;  Laterality: N/A;   URETEROSCOPY WITH HOLMIUM LASER LITHOTRIPSY Left 08/16/2017   Procedure: URETEROSCOPY WITH HOLMIUM  LASER LITHOTRIPSY;  Surgeon: Vanna Scotland, MD;  Location: [facility];  Service: Urology;  Laterality: Left;   VASECTOMY      Home Medications:  Allergies as of 07/30/2022       Reactions   Cortisone    inj to shoulder - paralysis   Celestone Soluspan [betamethasone]    Other reaction(s): Numbness   Neurontin [gabapentin]    Altered mental state   Other    Steroids - paralysis         Medication List        Accurate as of July 30, 2022 11:27 AM. If you have any questions, ask your nurse or doctor.          amitriptyline 50 MG tablet Commonly known as: ELAVIL Take 50 mg by mouth at bedtime.   aspirin EC 81 MG tablet Take 81 mg by mouth daily.   atorvastatin 80 MG tablet Commonly known as: LIPITOR Take 80 mg by mouth every evening.   latanoprost 0.005 % ophthalmic solution Commonly known as: XALATAN Place 1 drop into both eyes at bedtime.   levothyroxine 88 MCG tablet Commonly known as: SYNTHROID Take 176 mcg by mouth daily before breakfast.   phenazopyridine 200 MG tablet Commonly known as: Pyridium Take 1 tablet (200 mg total) by mouth 3 (three) times daily as needed for pain.   tamsulosin 0.4 MG Caps capsule Commonly known as: FLOMAX Take 0.8 mg by mouth daily after supper.  Urocit-K 10 10 MEQ (1080 MG) SR tablet Generic drug: potassium citrate Take 10 mEq by mouth 2 (two) times daily with a meal.        Allergies:  Allergies  Allergen Reactions   Cortisone     inj to shoulder - paralysis   Celestone Soluspan [Betamethasone]     Other reaction(s): Numbness   Neurontin [Gabapentin]     Altered mental state   Other     Steroids - paralysis     Family History: Family History  Problem Relation Age of Onset   Prostate cancer Neg Hx    Bladder Cancer Neg Hx    Kidney cancer Neg Hx     Social History:  reports that he has never smoked. He has never used smokeless tobacco. He reports current alcohol use. He reports that he does not use  drugs.  ROS: All other review of systems were reviewed and are negative except what is noted above in HPI  Physical Exam: BP (!) 154/92   Pulse 98   Constitutional:  Alert and oriented, No acute distress. HEENT: [place] AT, moist mucus membranes.  Trachea midline, no masses. Cardiovascular: No clubbing, cyanosis, or edema. Respiratory: Normal respiratory effort, no increased work of breathing. GI: Abdomen is soft, nontender, nondistended, no abdominal masses GU: No CVA tenderness.  Lymph: No cervical or inguinal lymphadenopathy. Skin: No rashes, bruises or suspicious lesions. Neurologic: Grossly intact, no focal deficits, moving all 4 extremities. Psychiatric: Normal mood and affect.  Laboratory Data: Lab Results  Component Value Date   WBC 6.4 02/28/2021   HGB 15.1 02/28/2021   HCT 45.7 02/28/2021   MCV 93.5 02/28/2021   PLT 227 02/28/2021    Lab Results  Component Value Date   CREATININE 1.21 02/28/2021    Lab Results  Component Value Date   PSA 1.21 Test Methodology: [employer] PSA 08/16/2008    No results found for: "TESTOSTERONE"  No results found for: "HGBA1C"  Urinalysis    Component Value Date/Time   COLORURINE AMBER (A) 01/31/2021 0923   APPEARANCEUR Clear 06/11/2021 1009   LABSPEC >1.030 (H) 01/31/2021 0923   PHURINE 5.5 01/31/2021 0923   GLUCOSEU 3+ (A) 06/11/2021 1009   HGBUR LARGE (A) 01/31/2021 0923   BILIRUBINUR Negative 06/11/2021 1009   KETONESUR 15 (A) 01/31/2021 0923   PROTEINUR 1+ (A) 06/11/2021 1009   PROTEINUR 100 (A) 01/31/2021 0923   UROBILINOGEN 0.2 09/17/2019 0832   UROBILINOGEN 0.2 08/16/2008 1556   NITRITE Negative 06/11/2021 1009   NITRITE NEGATIVE 01/31/2021 0923   LEUKOCYTESUR Negative 06/11/2021 1009   LEUKOCYTESUR SMALL (A) 01/31/2021 0923    Lab Results  Component Value Date   LABMICR See below: 06/11/2021   WBCUA 6-10 (A) 06/11/2021   RBCUA 3-10 (A) 03/17/2018   LABEPIT 0-10 06/11/2021   MUCUS Present 06/11/2021    BACTERIA Few 06/11/2021    Pertinent Imaging: KUb today: images reviewed and discussed with the patient Results for orders placed during the hospital encounter of 06/07/21  Abdomen 1 view (KUB)  Narrative CLINICAL DATA:  Renal stone  EXAM: ABDOMEN - 1 VIEW  COMPARISON:  09/17/2019  FINDINGS: Bowel gas pattern is nonspecific. Moderate to large amount of stool is seen in the proximal colon without signs of fecal impaction in the rectosigmoid. Kidneys are mostly obscured by bowel contents limiting evaluation for small stones. As far as seen, no definite abnormal calcifications or mass lesions are seen.  IMPRESSION: Nonspecific bowel gas pattern. Moderate to large stool burden  without signs of fecal impaction. Kidneys are mostly obscured by bowel contents limiting evaluation for renal stones.   Electronically Signed By: Ernie Avena M.D. On: 06/09/2021 14:35  No results found for this or any previous visit.  No results found for this or any previous visit.  No results found for this or any previous visit.  Results for orders placed during the hospital encounter of 08/21/17  US RENAL  Narrative CLINICAL DATA:  Left ureteral stone.  EXAM: RENAL / URINARY TRACT ULTRASOUND COMPLETE  COMPARISON:  None.  FINDINGS: Right Kidney:  Length: 10.4 cm. Echogenicity within normal limits. No mass or hydronephrosis visualized. 3 mm calculus within the lower pole of the right kidney, nonobstructive.  Left Kidney:  Length: 11.7 cm. Echogenicity within normal limits. No mass or hydronephrosis visualized.  Bladder:  Appears normal for degree of bladder distention. Bilateral ureteral jets seen.  IMPRESSION: 3 mm nonobstructive right lower pole renal calculus, otherwise unremarkable.   Electronically Signed By: Ted Mcalpine M.D. On: 08/21/2017 23:18  No valid procedures specified. No results found for this or any previous visit.  Results for orders  placed during the hospital encounter of 01/31/21  CT Renal Stone Study  Narrative CLINICAL DATA:  Flank pain, kidney stone suspected  EXAM: CT ABDOMEN AND PELVIS WITHOUT CONTRAST  TECHNIQUE: Multidetector CT imaging of the abdomen and pelvis was performed following the standard protocol without IV contrast.  COMPARISON:  03/29/2020  FINDINGS: Lower chest: No acute abnormality.  Hepatobiliary: Mildly decreased attenuation of the hepatic parenchyma compatible with hepatic steatosis. No focal liver lesion identified on unenhanced imaging. Unremarkable gallbladder. No hyperdense gallstone. No biliary dilatation.  Pancreas: Unremarkable. No pancreatic ductal dilatation or surrounding inflammatory changes.  Spleen: Normal in size without focal abnormality.  Adrenals/Urinary Tract: Unremarkable adrenal glands. Bilateral kidneys have an unremarkable unenhanced appearance. No renal stone or hydronephrosis. Bilateral ureters are nondilated. No ureteral calculi are seen. Urinary bladder is decompressed, limiting its evaluation. There appears to be bladder wall thickening and there is slight perivesicular fat stranding, which may represent cystitis.  Stomach/Bowel: Stomach within normal limits. 12 mm fat density lipoma is noted within the second portion of the duodenum (series 2, image 32). No dilated loops of bowel. Numerous colonic diverticula. No focal bowel wall thickening or inflammatory changes.  Vascular/Lymphatic: Aortic atherosclerosis. No enlarged abdominal or pelvic lymph nodes.  Reproductive: Mild prostatomegaly. There are scattered dystrophic calcifications within the prostate. Faint tiny calcification within the prostate gland at the midline (series 2, image 88) which could potentially represent a calculus within the prostatic portion of the urethra given the patient's symptoms.  Other: No free fluid. No abdominopelvic fluid collection. No pneumoperitoneum. No  abdominal wall hernia.  Musculoskeletal: No acute or significant osseous findings.  IMPRESSION: 1. Faint tiny calcification within the prostate gland at the midline which could potentially represent a calculus within the prostatic portion of the urethra given the patient's symptoms. 2. Urinary bladder is decompressed, limiting its evaluation. There appears to be bladder wall thickening and slight perivesicular fat stranding, which may represent cystitis. Correlation with urinalysis is recommended. 3. No renal or ureteral calculi.  No hydronephrosis. 4. Colonic diverticulosis without evidence of acute diverticulitis. 5. Hepatic steatosis.  Aortic Atherosclerosis (ICD10-I70.0).   Electronically Signed By: Duanne Guess D.O. On: 01/31/2021 10:36   Assessment & Plan:    1. Ureteral calculus -restart flomax  2. Benign prostatic hyperplasia with urinary obstruction Restart flomax - Urinalysis, Routine w reflex microscopic  3. Weak  urinary stream -flomax 0.4mg  daily   No follow-ups on file.  Wilkie Aye, MD  [facility] [place]

## 2022-08-03 ENCOUNTER — Encounter: Payer: Self-pay | Admitting: Urology

## 2022-08-03 NOTE — Patient Instructions (Signed)

## 2022-09-05 ENCOUNTER — Ambulatory Visit: Payer: TRICARE For Life (TFL) | Admitting: Urology

## 2023-01-13 ENCOUNTER — Encounter: Payer: Self-pay | Admitting: Nutrition

## 2023-01-13 ENCOUNTER — Encounter: Payer: No Typology Code available for payment source | Attending: Internal Medicine | Admitting: Nutrition

## 2023-01-13 VITALS — Ht 70.0 in | Wt 190.0 lb

## 2023-01-13 DIAGNOSIS — E119 Type 2 diabetes mellitus without complications: Secondary | ICD-10-CM | POA: Insufficient documentation

## 2023-01-13 NOTE — Patient Instructions (Signed)
Goals  Cut out red meat and choose more fish, [place], and chicken. Eat 45-60 grams of carbs with meals. Exercise 30 minutes 3 times per week. Get A1C below 5.7% in 6 months.

## 2023-01-13 NOTE — Progress Notes (Signed)
Medical Nutrition Therapy  Appointment Start time:  0800  Appointment End time:  0900  Primary concerns today:  DM Type 2  Referral diagnosis: E11.8 Preferred learning style: NO Preference Learning readiness: Ready    NUTRITION ASSESSMENT  67 yr old white male referred for new onset Type 2 DM. Is a veteran and gets his care from Dr. Jana Hakim in [place], [place]. He is here with his wife. Retired from [employer]. A1C 7.2%. 7/24. Was tried on Glipizide and after 1 dose, his BS dropped in the 50's and was immediately taken off of it. No current DM medications being used. Hyperlipidemia-on Lipitor. CKD Stg 2, Creatine 1.32 mg/dl. eGFR 51. TG 180 mg/dl. HDL low 27. Checks BS in am, 90-100's usually.  Has been avoiding carbs and only eating maybe 100 grams a day.  He wants to reverse his DM. Had been prediabetic for a long time he reports. He has changed a lot of his food choices since getting diagnosed with Type 2 Dm. He has lost about 30 lbs per his reports. Has cut out sweets, processed foods, junk food. Drinking water. Getting physical activity daily.  He is willing to work with Lifestyle Medicine with a whole plant based diet mostly, and the 6 pillars of health to reverse his DM and CVD and prevent further health problems and/or complications.  Clinical Medical Hx:  Past Medical History:  Diagnosis Date   Arthritis    NECK   BPH (benign prostatic hyperplasia)    Cancer (HCC)    SKIN CANCER-BASAL   Coronary artery disease    History of kidney stones    Hypercholesterolemia    Hypothyroidism    Renal disorder    kidney stones   Sleep apnea    USES CPAP   Thyroid disease     Medications:  Current Outpatient Medications on File Prior to Visit  Medication Sig Dispense Refill   amitriptyline (ELAVIL) 50 MG tablet Take 50 mg by mouth at bedtime. (Patient not taking: Reported on 07/30/2022)     aspirin EC 81 MG tablet Take 81 mg by mouth daily.     atorvastatin (LIPITOR) 80  MG tablet Take 80 mg by mouth every evening.      latanoprost (XALATAN) 0.005 % ophthalmic solution Place 1 drop into both eyes at bedtime.     levothyroxine (SYNTHROID) 88 MCG tablet Take 176 mcg by mouth daily before breakfast.     phenazopyridine (PYRIDIUM) 200 MG tablet Take 1 tablet (200 mg total) by mouth 3 (three) times daily as needed for pain. 10 tablet 0   tamsulosin (FLOMAX) 0.4 MG CAPS capsule Take 0.8 mg by mouth daily after supper. (Patient not taking: Reported on 06/11/2021)     UROCIT-K 10 10 MEQ (1080 MG) SR tablet Take 10 mEq by mouth 2 (two) times daily with a meal.     No current facility-administered medications on file prior to visit.    Labs: A1C 7.2%. Notable Signs/Symptoms: None  Lifestyle & Dietary Hx LIfes with wife. Retired. Eats out some and eats at home.  Estimated daily fluid intake: 100 oz Supplements: MVI Sleep: 6 hrs SLeep apnea Stress / self-care: none Current average weekly physical activity: Active outdoors  24-Hr Dietary Recall Eats 3 meals per day. Working on cutting out snacks. Drinks water.  Picky eater at times. Has cut out a lot of fast foods, processed foods and junk food.  Estimated Energy Needs Calories: 2000-2200 Carbohydrate: 225g Protein: 150g Fat: 56g  NUTRITION DIAGNOSIS  NB-1.1 Food and nutrition-related knowledge deficit As related to Diabetes Type 2.  As evidenced by A1C 7.2%.   NUTRITION INTERVENTION  Nutrition education (E-1) on the following topics:  Nutrition and Diabetes education provided on My Plate, CHO counting, meal planning, portion sizes, timing of meals, avoiding snacks between meals unless having a low blood sugar, target ranges for A1C and blood sugars, signs/symptoms and treatment of hyper/hypoglycemia, monitoring blood sugars, taking medications as prescribed, benefits of exercising 30 minutes per day and prevention of complications of DM.  Lifestyle Medicine  - Whole Food, Plant Predominant Nutrition  is highly recommended: Eat Plenty of vegetables, Mushrooms, fruits, Legumes, Whole Grains, Nuts, seeds in lieu of processed meats, processed snacks/pastries red meat, poultry, eggs.    -It is better to avoid simple carbohydrates including: Cakes, Sweet Desserts, Ice Cream, Soda (diet and regular), Sweet Tea, Candies, Chips, Cookies, Store Bought Juices, Alcohol in Excess of  1-2 drinks a day, Lemonade,  Artificial Sweeteners, Doughnuts, Coffee Creamers, "Sugar-free" Products, etc, etc.  This is not a complete list.....  Exercise: If you are able: 30 -60 minutes a day ,4 days a week, or 150 minutes a week.  The longer the better.  Combine stretch, strength, and aerobic activities.  If you were told in the past that you have high risk for cardiovascular diseases, you may seek evaluation by your heart doctor prior to initiating moderate to intense exercise programs.   Handouts Provided Include  LIfestyle Medicine handouts Know your numbers Hypoglcyemia handout  Learning Style & Readiness for Change Teaching method utilized: Visual & Auditory  Demonstrated degree of understanding via: Teach Back  Barriers to learning/adherence to lifestyle change: none  Goals Established by Pt Goals  Cut out red meat and choose more fish, [place], and chicken. Eat 45-60 grams of carbs with meals. Exercise 30 minutes 3 times per week. Get A1C below 5.7% in 6 months.   MONITORING & EVALUATION Dietary intake, weekly physical activity, and BS in 2 months.  Next Steps  Patient is to work on meal planning, timing of meals and exercise.Marland Kitchen

## 2023-03-24 ENCOUNTER — Encounter: Payer: Self-pay | Admitting: Nutrition

## 2023-03-24 ENCOUNTER — Encounter: Payer: No Typology Code available for payment source | Attending: Internal Medicine | Admitting: Nutrition

## 2023-03-24 VITALS — Ht 70.0 in | Wt 185.0 lb

## 2023-03-24 DIAGNOSIS — Z713 Dietary counseling and surveillance: Secondary | ICD-10-CM | POA: Diagnosis not present

## 2023-03-24 DIAGNOSIS — E119 Type 2 diabetes mellitus without complications: Secondary | ICD-10-CM | POA: Insufficient documentation

## 2023-03-24 NOTE — Progress Notes (Signed)
Medical Nutrition Therapy  Appointment Start time:  0800  Appointment End time:  0900  Primary concerns today:  DM Type 2  Referral diagnosis: E11.8 Preferred learning style: NO Preference Learning readiness: Ready    NUTRITION ASSESSMENT  67 yr old white male referred for new onset Type 2 DM. Is a veteran and gets his care from Dr. Jana Hakim in [place], [place]. He is here with his wife. Retired from [employer]. He notes is most recent A1C was 6%. BS logs show Avg BS 117 mg/dl. He has cut out sodas, tea, processed fast foods, eating more fruits, vegetables and whole grains. Drinking only water, getting a variety of exercise.. Feels much better. He has lost 23 lbs in the last 2 months. Feels great. Not as hungry and no longer craves foods.  He notes his liver enzmes and eGFR has improved since last visit also. He has not been taking any medications for his DM. He is now in pre-diabetes range. His goal is to get his A1C below 5.7% and reverse his DM.  Goals set previously: Cut out red meat and choose more fish, [place], and chicken.-done Eat 45-60 grams of carbs with meals.-done Exercise 30 minutes 3 times per week.-doing Get A1C below 5.7% in 6 months. A1C to 6%, working on getting it to 5.7%.  Clinical Wt Readings from Last 3 Encounters:  03/24/23 185 lb (83.9 kg)  01/13/23 190 lb (86.2 kg)  03/04/21 208 lb (94.3 kg)   Ht Readings from Last 3 Encounters:  03/24/23 5\' 10"  (1.778 m)  01/13/23 5\' 10"  (1.778 m)  03/04/21 5\' 10"  (1.778 m)   Body mass index is 26.54 kg/m. @BMIFA @ Facility age limit for growth %iles is 20 years. Facility age limit for growth %iles is 20 years.  Medical Hx:  Past Medical History:  Diagnosis Date   Arthritis    NECK   BPH (benign prostatic hyperplasia)    Cancer (HCC)    SKIN CANCER-BASAL   Coronary artery disease    History of kidney stones    Hypercholesterolemia    Hypothyroidism    Renal disorder    kidney stones   Sleep apnea     USES CPAP   Thyroid disease     Medications:  Current Outpatient Medications on File Prior to Visit  Medication Sig Dispense Refill   amitriptyline (ELAVIL) 50 MG tablet Take 50 mg by mouth at bedtime. (Patient not taking: Reported on 07/30/2022)     aspirin EC 81 MG tablet Take 81 mg by mouth daily.     atorvastatin (LIPITOR) 80 MG tablet Take 80 mg by mouth every evening.      latanoprost (XALATAN) 0.005 % ophthalmic solution Place 1 drop into both eyes at bedtime.     levothyroxine (SYNTHROID) 88 MCG tablet Take 176 mcg by mouth daily before breakfast.     Multiple Vitamin (MULTIVITAMIN WITH MINERALS) TABS tablet Take 1 tablet by mouth daily.     phenazopyridine (PYRIDIUM) 200 MG tablet Take 1 tablet (200 mg total) by mouth 3 (three) times daily as needed for pain. 10 tablet 0   potassium chloride (KLOR-CON) 10 MEQ tablet Take 10 mEq by mouth daily.     tamsulosin (FLOMAX) 0.4 MG CAPS capsule Take 0.8 mg by mouth daily after supper. (Patient not taking: Reported on 06/11/2021)     UROCIT-K 10 10 MEQ (1080 MG) SR tablet Take 10 mEq by mouth 2 (two) times daily with a meal.     No  current facility-administered medications on file prior to visit.    Labs: A1C 7.2%. Now it is 6% Notable Signs/Symptoms: None  Lifestyle & Dietary Hx LIfes with wife. Retired. Eats out some and eats at home.  Estimated daily fluid intake: 100 oz Supplements: MVI Sleep: 6 hrs SLeep apnea Stress / self-care: none Current average weekly physical activity: Active outdoors  24-Hr Dietary Recall Eats 3 meals per day. Working on cutting out snacks. Drinks water.  Picky eater at times. Has cut out a lot of fast foods, processed foods and junk food.  Estimated Energy Needs Calories: 2000-2200 Carbohydrate: 225g Protein: 150g Fat: 56g   NUTRITION DIAGNOSIS  NB-1.1 Food and nutrition-related knowledge deficit As related to Diabetes Type 2.  As evidenced by A1C 7.2%.   NUTRITION INTERVENTION   Nutrition education (E-1) on the following topics:  Nutrition and Diabetes education provided on My Plate, CHO counting, meal planning, portion sizes, timing of meals, avoiding snacks between meals unless having a low blood sugar, target ranges for A1C and blood sugars, signs/symptoms and treatment of hyper/hypoglycemia, monitoring blood sugars, taking medications as prescribed, benefits of exercising 30 minutes per day and prevention of complications of DM.  Lifestyle Medicine  - Whole Food, Plant Predominant Nutrition is highly recommended: Eat Plenty of vegetables, Mushrooms, fruits, Legumes, Whole Grains, Nuts, seeds in lieu of processed meats, processed snacks/pastries red meat, poultry, eggs.    -It is better to avoid simple carbohydrates including: Cakes, Sweet Desserts, Ice Cream, Soda (diet and regular), Sweet Tea, Candies, Chips, Cookies, Store Bought Juices, Alcohol in Excess of  1-2 drinks a day, Lemonade,  Artificial Sweeteners, Doughnuts, Coffee Creamers, "Sugar-free" Products, etc, etc.  This is not a complete list.....  Exercise: If you are able: 30 -60 minutes a day ,4 days a week, or 150 minutes a week.  The longer the better.  Combine stretch, strength, and aerobic activities.  If you were told in the past that you have high risk for cardiovascular diseases, you may seek evaluation by your heart doctor prior to initiating moderate to intense exercise programs.   Handouts Provided Include  LIfestyle Medicine handouts Know your numbers Hypoglcyemia handout  Learning Style & Readiness for Change Teaching method utilized: Visual & Auditory  Demonstrated degree of understanding via: Teach Back  Barriers to learning/adherence to lifestyle change: none  Goals Established by Pt Goals Keep up the great job! Get in 150 minutes of physical activity weekly. Get A1C to 5.7%   MONITORING & EVALUATION Dietary intake, weekly physical activity, and BS in 4 months.  Next Steps   Patient is to work on meal planning, timing of meals and exercise.Marland Kitchen

## 2023-03-24 NOTE — Patient Instructions (Signed)
Keep up the great job! Get in 150 minutes of physical activity weekly. Get A1C to 5.7%

## 2023-07-29 ENCOUNTER — Ambulatory Visit: Payer: No Typology Code available for payment source | Admitting: Nutrition

## 2023-12-19 ENCOUNTER — Other Ambulatory Visit: Payer: Self-pay

## 2023-12-19 ENCOUNTER — Encounter (HOSPITAL_COMMUNITY): Payer: Self-pay

## 2023-12-19 ENCOUNTER — Emergency Department (HOSPITAL_COMMUNITY)
Admission: EM | Admit: 2023-12-19 | Discharge: 2023-12-19 | Disposition: A | Discharge status: Home / Self Care | Attending: Emergency Medicine | Admitting: Emergency Medicine

## 2023-12-19 DIAGNOSIS — I10 Essential (primary) hypertension: Secondary | ICD-10-CM | POA: Insufficient documentation

## 2023-12-19 DIAGNOSIS — Z7982 Long term (current) use of aspirin: Secondary | ICD-10-CM | POA: Diagnosis not present

## 2023-12-19 DIAGNOSIS — R519 Headache, unspecified: Secondary | ICD-10-CM | POA: Diagnosis present

## 2023-12-19 NOTE — Discharge Instructions (Addendum)
 [facility]

## 2023-12-19 NOTE — ED Provider Notes (Signed)
 [place] EMERGENCY DEPARTMENT AT [facility] Provider Note   CSN: [id number] Arrival date & time: 12/19/23  8076     Patient presents with: Headache   Adrian Marshall is a 68 y.o. male.   Patient's family reports tonight they were looking at patient's left arm and they noticed a blood vessel which is balancing.  Patient reports that his blood pressure was high at home as well.  Family reports that they spoke to another family member who is an EMT who was concerned that patient could have an aneurysm in his arm.  Patient states that he also has a headache.  Patient states that he actually has soreness in his neck.  He does have chronic neck problems and frequently has soreness in this area.  Patient states he has not had any vision changes he has not had any hearing changes.  He has not had any nausea or vomiting he denies any chest pain he has not had any shortness of breath.  The history is provided by the patient and the spouse.  Headache      Prior to Admission medications   Medication Sig Start Date End Date Taking? Authorizing Provider  amitriptyline (ELAVIL) 50 MG tablet Take 50 mg by mouth at bedtime. Patient not taking: Reported on 07/30/2022 03/15/21   [provider]  aspirin EC 81 MG tablet Take 81 mg by mouth daily.    [provider]  atorvastatin  (LIPITOR ) 80 MG tablet Take 80 mg by mouth every evening.     [provider]  latanoprost (XALATAN) 0.005 % ophthalmic solution Place 1 drop into both eyes at bedtime.    [provider]  levothyroxine  (SYNTHROID ) 88 MCG tablet Take 176 mcg by mouth daily before breakfast.    [provider]  Multiple Vitamin (MULTIVITAMIN WITH MINERALS) TABS tablet Take 1 tablet by mouth daily.    [provider]  phenazopyridine  (PYRIDIUM ) 200 MG tablet Take 1 tablet (200 mg total) by mouth 3 (three) times daily as needed for pain. 04/16/21   McKenzie, Belvie CROME, MD  potassium  chloride (KLOR-CON) 10 MEQ tablet Take 10 mEq by mouth daily.    [provider]  tamsulosin  (FLOMAX ) 0.4 MG CAPS capsule Take 0.8 mg by mouth daily after supper. Patient not taking: Reported on 06/11/2021    [provider]  UROCIT-K 10 10 MEQ (1080 MG) SR tablet Take 10 mEq by mouth 2 (two) times daily with a meal. 02/27/20   [provider]    Allergies: Cortisone, Celestone soluspan [betamethasone], Neurontin [gabapentin], and Other    Review of Systems  Neurological:  Positive for headaches.  All other systems reviewed and are negative.   Updated Vital Signs BP (!) 146/77   Pulse 71   Temp 98.2 F (36.8 C) (Oral)   Resp 20   Ht 5' 10 (1.778 m)   Wt 89.4 kg   SpO2 95%   BMI 28.27 kg/m   Physical Exam Vitals and nursing note reviewed.  Constitutional:      Appearance: He is well-developed.  HENT:     Head: Normocephalic.  Cardiovascular:     Rate and Rhythm: Normal rate.  Pulmonary:     Effort: Pulmonary effort is normal.  Abdominal:     General: There is no distension.  Musculoskeletal:        General: Normal range of motion.     Cervical back: Normal range of motion.     Comments:  Pt has palpable and visual pulse bilat arms brachial area,  no swelling  no deformity    Skin:    General: Skin is warm.  Neurological:     General: No focal deficit present.     Mental Status: He is alert and oriented to person, place, and time.     (all labs ordered are listed, but only abnormal results are displayed) Labs Reviewed - No data to display  EKG: None  Radiology: No results found.   Procedures   Medications Ordered in the ED - No data to display                                  Medical Decision Making Pt has a visible pulse bilat brachial area.   Amount and/or Complexity of Data Reviewed Independent Historian: spouse    Details: Pt here with family  Risk Risk Details: Patient is hypertensive on arrival, patient appears  anxious.  Patient reports he recently had a complete physical at the [facility].  Patient states his blood pressure was normal.  Patient states his cholesterol was slightly elevated and he is supposed to follow-up. I had patient rest on the stretcher for 15 minutes.  Patient's blood pressure was rechecked and has decreased to 140/80. I discussed pt with Dr. Cleotilde.  Pt does not appear to have any vascular pathology.  Patient is advised that he does need to follow-up for recheck of his blood pressure and his cholesterol.  He is advised to call the [facility] on Monday to schedule appointment for further evaluation.        Final diagnoses:  Hypertension, unspecified type    ED Discharge Orders     None       An After Visit Summary was printed and given to the patient.    Flint Sonny MARLA DEVONNA 12/19/23 [id number]    Cleotilde Rogue, MD 12/20/23 [phone]

## 2023-12-19 NOTE — ED Triage Notes (Addendum)
 Pt c/o headache, pain more so in base of neck on and off for the past 10 days. Pt reports he noticed a visible bounding pulse in his left AC about 3 hours PTA and states his blood pressure has been high. BP 202/94 in triage. Denies any dizziness

## 2023-12-20 ENCOUNTER — Other Ambulatory Visit: Payer: Self-pay
